# Patient Record
Sex: Female | Born: 1985
Health system: Southern US, Community
[De-identification: ages and names within clinical notes are randomized; demographics above are authoritative.]

## PROBLEM LIST (undated history)

## (undated) DIAGNOSIS — B9689 Other specified bacterial agents as the cause of diseases classified elsewhere: Secondary | ICD-10-CM

## (undated) DIAGNOSIS — R7303 Prediabetes: Secondary | ICD-10-CM

## (undated) DIAGNOSIS — E079 Disorder of thyroid, unspecified: Secondary | ICD-10-CM

## (undated) DIAGNOSIS — N898 Other specified noninflammatory disorders of vagina: Secondary | ICD-10-CM

## (undated) DIAGNOSIS — N63 Unspecified lump in unspecified breast: Secondary | ICD-10-CM

## (undated) DIAGNOSIS — N760 Acute vaginitis: Secondary | ICD-10-CM

## (undated) DIAGNOSIS — E162 Hypoglycemia, unspecified: Secondary | ICD-10-CM

## (undated) HISTORY — PX: ABDOMINAL HYSTERECTOMY: SHX81

## (undated) HISTORY — PX: TONSILLECTOMY: SUR1361

## (undated) HISTORY — DX: Unspecified lump in unspecified breast: N63.0

## (undated) HISTORY — DX: Other specified bacterial agents as the cause of diseases classified elsewhere: B96.89

## (undated) HISTORY — DX: Other specified noninflammatory disorders of vagina: N89.8

## (undated) HISTORY — PX: TUBAL LIGATION: SHX77

## (undated) HISTORY — PX: APPENDECTOMY: SHX54

## (undated) HISTORY — DX: Disorder of thyroid, unspecified: E07.9

## (undated) HISTORY — DX: Acute vaginitis: N76.0

---

## 2009-11-26 ENCOUNTER — Ambulatory Visit (HOSPITAL_COMMUNITY): Admission: RE | Admit: 2009-11-26 | Discharge: 2009-11-26 | Payer: Self-pay | Admitting: Obstetrics

## 2010-01-18 ENCOUNTER — Inpatient Hospital Stay (HOSPITAL_COMMUNITY)
Admission: AD | Admit: 2010-01-18 | Discharge: 2010-01-18 | Payer: Self-pay | Source: Home / Self Care | Attending: Obstetrics | Admitting: Obstetrics

## 2010-02-08 LAB — CBC
HCT: 41.3 % (ref 36.0–46.0)
Hemoglobin: 14.4 g/dL (ref 12.0–15.0)
MCH: 29.2 pg (ref 26.0–34.0)
MCHC: 34.9 g/dL (ref 30.0–36.0)
MCV: 83.8 fL (ref 78.0–100.0)
Platelets: 228 10*3/uL (ref 150–400)
RBC: 4.93 MIL/uL (ref 3.87–5.11)
RDW: 13.1 % (ref 11.5–15.5)
WBC: 15.7 10*3/uL — ABNORMAL HIGH (ref 4.0–10.5)

## 2010-02-08 LAB — SURGICAL PCR SCREEN
MRSA, PCR: NEGATIVE
Staphylococcus aureus: NEGATIVE

## 2010-02-08 LAB — RPR: RPR Ser Ql: NONREACTIVE

## 2010-02-10 ENCOUNTER — Inpatient Hospital Stay (HOSPITAL_COMMUNITY)
Admission: RE | Admit: 2010-02-10 | Discharge: 2010-02-13 | Payer: Self-pay | Source: Home / Self Care | Attending: Obstetrics | Admitting: Obstetrics

## 2010-02-10 ENCOUNTER — Encounter (INDEPENDENT_AMBULATORY_CARE_PROVIDER_SITE_OTHER): Payer: Self-pay | Admitting: Obstetrics

## 2010-02-15 LAB — CBC
HCT: 33.2 % — ABNORMAL LOW (ref 36.0–46.0)
Hemoglobin: 11.4 g/dL — ABNORMAL LOW (ref 12.0–15.0)
MCH: 28.9 pg (ref 26.0–34.0)
MCHC: 34.3 g/dL (ref 30.0–36.0)
MCV: 84.3 fL (ref 78.0–100.0)
Platelets: 162 10*3/uL (ref 150–400)
RBC: 3.94 MIL/uL (ref 3.87–5.11)
RDW: 13.2 % (ref 11.5–15.5)
WBC: 14.3 10*3/uL — ABNORMAL HIGH (ref 4.0–10.5)

## 2010-02-15 LAB — RH IMMUNE GLOB WKUP(>/=20WKS)(NOT WOMEN'S HOSP)
Fetal Screen: NEGATIVE
Unit division: 0

## 2010-02-17 NOTE — Discharge Summary (Addendum)
  NAMEARMIDA, [name]               ACCOUNT NO.:  [id number]  MEDICAL RECORD NO.:  [id number]          PATIENT TYPE:  INP  LOCATION:  9104                          FACILITY:  [facility]  PHYSICIAN:  Roseanna Rainbow, M.D.DATE OF BIRTH:  Oct 30, 1985  DATE OF ADMISSION:  02/10/2010 DATE OF DISCHARGE:  02/13/2010                              DISCHARGE SUMMARY   CHIEF COMPLAINT:  The patient is a 25 year old gravid 2, para 1-0-0-1 with an estimated date of confinement of January 24 with a history of previous cesarean delivery, now for an elective repeat cesarean delivery and bilateral tubal ligation.  HISTORY OF PRESENT ILLNESS:  Please see the above.  PRENATAL LABS:  GBS negative, Rh negative.  She received RhoGAM.  FAMILY HISTORY:  She denies.  SOCIAL HISTORY:  She denies any tobacco, ethanol, or drug use.  ALLERGIES:  No known drug allergies.  MEDICATIONS:  Please see the medication reconciliation form.  REVIEW OF SYSTEMS:  Noncontributory.  PHYSICAL EXAMINATION:  VITAL SIGNS:  Stable, afebrile. GENERAL:  No apparent distress, well developed. HEAD, EYES, EARS, NOSE, AND THROAT:  Normocephalic and atraumatic. NECK:  Supple. LUNGS:  Clear to auscultation bilaterally. ABDOMEN:  Gravid. PELVIC:  Omitted. EXTREMITIES:  Trace lower extremity edema. SKIN:  Without exanthem.  ASSESSMENT:  Intrauterine pregnancy at term, history of previous cesarean delivery, nullipara, desires tubal ligation.  PLAN:  Admission, repeat cesarean delivery, and bilateral tubal ligation.  HOSPITAL COURSE:  The patient was admitted.  She underwent a repeat cesarean delivery and bilateral tubal ligation.  Please see the dictated summary for findings.  On postoperative day #1, hemoglobin was 11.4. The remainder of her hospital course was uneventful.  She was discharged to home on postoperative day #3.  DISCHARGE DIAGNOSIS:  Intrauterine pregnancy at term, history of previous cesarean delivery,  desires sterilization procedure.  PROCEDURES:  Repeat cesarean delivery and bilateral tubal ligation.  CONDITION:  Good.  DIET:  Regular.  ACTIVITY:  Pelvic rest progressive activity.  MEDICATIONS: 1. Percocet 5/325 one-two tablets every 6 hours as needed. 2. Prenatal vitamins.  DISPOSITION:  The patient was to follow up with Dr. Gaynell Face.     Roseanna Rainbow, M.D.     Judee Clara  D:  02/13/2010  T:  02/13/2010  Job:  [id number]  cc:   Kathreen Cosier, M.D. Fax: [phone]  Electronically Signed by Antionette Char M.D. on 02/17/2010 09:12:12 PM

## 2010-02-19 NOTE — Op Note (Signed)
  [name], [name]               ACCOUNT NO.:  [id number]  MEDICAL RECORD NO.:  [id number]          PATIENT TYPE:  INP  LOCATION:  9104                          [facility]ILITY:  [facility]  PHYSICIAN:  Kathreen Cosier, M.D.DATE OF BIRTH:  [birth year]  DATE OF PROCEDURE:  02/10/2010 DATE OF DISCHARGE:                              OPERATIVE REPORT   PREOPERATIVE DIAGNOSES: 1. Previous cesarean section at term. 2. Multiparity. 3. Desires repeat cesarean section and tubal ligation.  POSTOPERATIVE DIAGNOSES: 1. Previous cesarean section at term. 2. Multiparity. 3. Desires repeat cesarean section and tubal ligation.  SURGEON:  Kathreen Cosier, MD  FIRST ASSISTANT:  Charles A. Clearance Coots, MD  ANESTHESIA:  Spinal.  PROCEDURE IN DETAIL:  The patient placed on the operating table in supine position after the spinal administered.  Abdomen prepped and draped.  Bladder emptied with Foley catheter.  Transverse suprapubic incision made through the old scar, carried down to the rectus fascia. Fascia cleaned and incised length of the incision.  Recti muscles was retracted laterally and the peritoneum incised longitudinally.  The uterus was adherent to the anterior abdominal wall and there were multiple large adhesions which were cut with sharp dissection.  Bladder flap was developed and the bladder pushed down off of the lower uterine segment.  Transverse incision made.  Fluid clear.  The patient delivered from the LOA position of a female Apgars are 8 and 9, weighing 6 pounds 4 ounces.  The fluid was clear.  The placenta was posterior, removed manually, and sent to labor and delivery.  Uterine cavity closed in one layer with continuous suture of #1 chromic.  Bladder flap was reattached with 2-0 chromic.  The remaining adhesions between the abdominal wall and the uterus were lysed with cautery.  The right tube grasped in midportion with Babcock clamp.  A 0 plain suture placed in the mesosalpinx  below the portion of tube within the clamp.  On the left side, the tube was removed in its entirety because of adhesions. Hemostasis achieved using 0 plain suture.  Lap and sponge counts correct.  Abdomen closed in layers, fascia continuous suture of 0 Dexon and the skin closed with subcuticular stitch of 4-0 Monocryl.  Blood loss was 600 mL.  The patient tolerated the procedure well, taken to recovery room in good condition.          ______________________________ Kathreen Cosier, M.D.     BAM/MEDQ  D:  02/10/2010  T:  02/10/2010  Job:  [id number]  Electronically Signed by Francoise Ceo M.D. on 02/17/2010 06:31:52 AM

## 2010-04-07 LAB — RH IMMUNE GLOBULIN WORKUP (NOT WOMEN'S HOSP)
ABO/RH(D): A NEG
Antibody Screen: NEGATIVE
Unit division: 0

## 2011-12-05 ENCOUNTER — Encounter (HOSPITAL_COMMUNITY): Payer: Self-pay | Admitting: Emergency Medicine

## 2011-12-05 ENCOUNTER — Emergency Department (HOSPITAL_COMMUNITY)
Admission: EM | Admit: 2011-12-05 | Discharge: 2011-12-05 | Disposition: A | Discharge status: Home / Self Care | Payer: BC Managed Care – PPO | Attending: Emergency Medicine | Admitting: Emergency Medicine

## 2011-12-05 DIAGNOSIS — L509 Urticaria, unspecified: Secondary | ICD-10-CM | POA: Insufficient documentation

## 2011-12-05 DIAGNOSIS — I10 Essential (primary) hypertension: Secondary | ICD-10-CM | POA: Insufficient documentation

## 2011-12-05 DIAGNOSIS — E162 Hypoglycemia, unspecified: Secondary | ICD-10-CM

## 2011-12-05 HISTORY — DX: Hypoglycemia, unspecified: E16.2

## 2011-12-05 LAB — GLUCOSE, CAPILLARY: Glucose-Capillary: 105 mg/dL — ABNORMAL HIGH (ref 70–99)

## 2011-12-05 NOTE — ED Notes (Signed)
Pt states has sudden onset of hot flash and dizziness. Pt checked bp at work and was elevated. Pt then checked sugar and was 52. Pt dranked something and then started feeling sweaty and disoriented

## 2011-12-05 NOTE — ED Notes (Signed)
Pt verbalized understanding of d/c instructions, states that she has f/u appt with specialist, pt ambulated with steady gait, refused wheelchair

## 2011-12-05 NOTE — ED Notes (Signed)
MD at bedside. 

## 2011-12-05 NOTE — ED Provider Notes (Signed)
History   This chart was scribed for Donnetta Hutching, MD by Charolett Bumpers, ER Scribe. The patient was seen in room APA18/APA18. Patient's care was started at 1043.   CSN: [id number]  Arrival date & time 12/05/11  1037   First MD Initiated Contact with Patient 12/05/11 1043      Chief Complaint  Patient presents with  . Hypoglycemia  . Hypertension  . Urticaria    The history is provided by the patient. No language interpreter was used.   [name] is a 26 y.o. female who presents to the Emergency Department complaining of hypoglycemia. She states that she was at work this morning when she had a sudden onset of dizziness and hot-flashes. She states she checked her BP which was 148/69 and her blood sugar was 57. She states she then sat down without relief. She started having tremors, nausea and tinnitus. She states she was given a Nepro drink full of calories at that time. While sitting down, her BP was rechecked and was 170/100 and her skin became flushed and diaphoretic. She states she ate breakfast around 8:15 am and around 5 am this morning. She reports a h/o hypoglycemia but states she hasn't been dx with DM. She states she has had several similar episodes recently and has an appointment with endocrinology on 12/3 due to an abnormality with her insulin levels. She works as a Insurance account manager. BP here in ED is 138/87.   Past Medical History  Diagnosis Date  . Hypoglycemia     Past Surgical History  Procedure Date  . Cesarean section   . Tonsillectomy     History reviewed. No pertinent family history.  History  Substance Use Topics  . Smoking status: Never Smoker   . Smokeless tobacco: Not on file  . Alcohol Use: No    OB History    Grav Para Term Preterm Abortions TAB SAB Ect Mult Living                  Review of Systems A complete 10 system review of systems was obtained and all systems are negative except as noted in the HPI and PMH.   Allergies    Codeine  Home Medications  No current outpatient prescriptions on file.  BP 138/87  Pulse 69  Temp 99.1 F (37.3 C) (Oral)  Resp 18  Ht 5\' 2"  (1.575 m)  Wt 186 lb (84.369 kg)  BMI 34.02 kg/m2  SpO2 100%  LMP 11/20/2011  Physical Exam  Nursing note and vitals reviewed. Constitutional: She is oriented to person, place, and time. She appears well-developed and well-nourished.  HENT:  Head: Normocephalic and atraumatic.  Eyes: Conjunctivae normal and EOM are normal. Pupils are equal, round, and reactive to light.  Neck: Normal range of motion. Neck supple.  Cardiovascular: Normal rate, regular rhythm and normal heart sounds.   No murmur heard. Pulmonary/Chest: Effort normal and breath sounds normal. No respiratory distress. She has no wheezes.  Abdominal: Soft. Bowel sounds are normal.  Musculoskeletal: Normal range of motion.  Neurological: She is alert and oriented to person, place, and time.  Skin: Skin is warm and dry.  Psychiatric: She has a normal mood and affect.    ED Course  Procedures (including critical care time)  DIAGNOSTIC STUDIES: Oxygen Saturation is 100% on room air, normal by my interpretation.    COORDINATION OF CARE:  10:55-Discussed planned course of treatment with the patient including Poct CBC, f/u with endocrinologist and  eating protein rich foods frequently to help with episodes of hypoglycemia, who is agreeable at this time.   Results for orders placed during the hospital encounter of 12/05/11  GLUCOSE, CAPILLARY      Component Value Range   Glucose-Capillary 105 (*) 70 - 99 mg/dL    No results found.   No diagnosis found.    MDM  Patient has had recurrent hypoglycemic episodes.  Encouraged to eat frequent small meals and to increase protein intake. She has an appointment with an endocrinologist December 3 in [place]   I personally performed the services described in this documentation, which was scribed in my presence. The  recorded information has been reviewed and is accurate.       Donnetta Hutching, MD 12/05/11 1249

## 2012-06-26 ENCOUNTER — Other Ambulatory Visit (HOSPITAL_COMMUNITY): Payer: Self-pay | Admitting: Nurse Practitioner

## 2012-06-26 DIAGNOSIS — R1903 Right lower quadrant abdominal swelling, mass and lump: Secondary | ICD-10-CM

## 2012-06-29 ENCOUNTER — Ambulatory Visit (HOSPITAL_COMMUNITY)
Admission: RE | Admit: 2012-06-29 | Discharge: 2012-06-29 | Disposition: A | Discharge status: Home / Self Care | Payer: Managed Care, Other (non HMO) | Source: Ambulatory Visit | Attending: Nurse Practitioner | Admitting: Nurse Practitioner

## 2012-06-29 ENCOUNTER — Other Ambulatory Visit (HOSPITAL_COMMUNITY): Payer: Self-pay | Admitting: Nurse Practitioner

## 2012-06-29 DIAGNOSIS — R1903 Right lower quadrant abdominal swelling, mass and lump: Secondary | ICD-10-CM

## 2012-06-29 DIAGNOSIS — R1900 Intra-abdominal and pelvic swelling, mass and lump, unspecified site: Secondary | ICD-10-CM | POA: Insufficient documentation

## 2012-07-18 ENCOUNTER — Encounter (HOSPITAL_COMMUNITY): Payer: Self-pay | Admitting: Pharmacy Technician

## 2012-07-19 NOTE — Consult Note (Signed)
NAMETYSHAY, ADEE NO.:  [id number]  MEDICAL RECORD NO.:  [id number]  LOCATION:  PERIO                         FACILITY:  [facility]  PHYSICIAN:  Barbaraann Barthel, M.D. DATE OF BIRTH:  Apr 28, 1985  DATE OF CONSULTATION:  07/18/2012 DATE OF DISCHARGE:                                CONSULTATION   NOTE:  This is a 27 year old white female who has been complaining of pain in her lower abdomen along a previous C-section scar.  She has had two previous C-sections and the last one was in 2012.  She has had pain that is characterized by being most intense during her menses and this has been over the last 2 years.  Palpably, I feel a nodule that was confirmed on presents by sonography to be a subcutaneous nodule approximately 1.5-2 cm in diameter.  I suspect this is endometriosis and we will plan to excise this via the outpatient department.  PAST MEDICAL HISTORY:  Fairly unremarkable.  PAST SURGICAL HISTORY:  Past surgeries have include a C-section in 2009 and 2012, and a T and A in childhood, and at her last C-section, she had a tubal ligation.  MEDICATIONS:  See medication list.  ALLERGIES:  The patient has problems with codeine, which causes her to have a rash.  SOCIAL HISTORY:  She is a nonsmoker and nondrinker.  PHYSICAL EXAMINATION:  VITAL SIGNS:  She is 5 feet and 2 inches, weighs 190 pounds, temperature is 97.9, pulse rate 60, respirations 14, blood pressure 100/60. HEENT:  Head is normocephalic.  Eyes; extraocular movements are intact. Pupils were round and reactive to light and accommodation.  There is no conjunctive pallor or scleral injection.  The sclera is a normal tincture.  Nose and oral mucosa are moist. NECK:  She has no cervical adenopathy.  No thyromegaly.  No bruits auscultated. CHEST:  Clear, both anterior and posterior auscultation. HEART:  Regular rhythm.  Breasts and axilla are without masses. ABDOMEN:  Soft.  She has no visceromegaly.   She has pain located on the right side of her C-section scar.  This does not feel like a hernia, feels like a subcutaneous nodule. RECTAL:  Deferred. PELVIC:  Deferred. EXTREMITIES:  Within normal limits.  REVIEW OF SYSTEMS:  NEUROLOGIC:  No history of migraine, seizures or any lateralizing neurological findings.  ENDOCRINE:  No history of diabetes, thyroid disease or adrenal problems.  CARDIOPULMONARY:  Within normal limits.  MUSCULOSKELETAL:  The patient is obese.  GI:  No history of hepatitis, constipation, diarrhea, bright red rectal bleeding, melena. No history of inflammatory bowel disease or irritable bowel syndrome. No unexplained weight loss.  She has never had a colonoscopy.  GU:  No history of frequency, dysuria or kidney stones.  OB/GYN HISTORY:  She is gravida 2, para 0, cesarean 2, abortus 0, female whose last menstrual period was on July 11, 2012 and she had a tubal ligation in 2012.  REVIEW OF HISTORY AND PHYSICAL:  Therefore, Ms. Graul is a 27 year old white female who clinically has endometriosis and there was a nodule noted on sonogram.  We will remove this via the outpatient department. I discussed surgery with her discussing complications, not limited  to, but including bleeding and infection, and the possibility that this could recur with other endometrial implants presenting later.  Informed consent was obtained.     Barbaraann Barthel, M.D.     WB/MEDQ  D:  07/18/2012  T:  07/19/2012  Job:  [id number]  cc:   [facility]

## 2012-07-20 ENCOUNTER — Encounter (HOSPITAL_COMMUNITY)
Admission: RE | Admit: 2012-07-20 | Discharge: 2012-07-20 | Disposition: A | Discharge status: Home / Self Care | Payer: Managed Care, Other (non HMO) | Source: Ambulatory Visit | Attending: General Surgery | Admitting: General Surgery

## 2012-07-20 ENCOUNTER — Encounter (HOSPITAL_COMMUNITY): Payer: Self-pay

## 2012-07-20 LAB — CBC WITH DIFFERENTIAL/PLATELET
Basophils Absolute: 0 10*3/uL (ref 0.0–0.1)
Basophils Relative: 0 % (ref 0–1)
Eosinophils Absolute: 0.1 10*3/uL (ref 0.0–0.7)
Eosinophils Relative: 2 % (ref 0–5)
HCT: 44.5 % (ref 36.0–46.0)
Hemoglobin: 15.5 g/dL — ABNORMAL HIGH (ref 12.0–15.0)
Lymphocytes Relative: 28 % (ref 12–46)
Lymphs Abs: 2.2 10*3/uL (ref 0.7–4.0)
MCH: 29.6 pg (ref 26.0–34.0)
MCHC: 34.8 g/dL (ref 30.0–36.0)
MCV: 85.1 fL (ref 78.0–100.0)
Monocytes Absolute: 0.7 10*3/uL (ref 0.1–1.0)
Monocytes Relative: 8 % (ref 3–12)
Neutro Abs: 5 10*3/uL (ref 1.7–7.7)
Neutrophils Relative %: 62 % (ref 43–77)
Platelets: 245 10*3/uL (ref 150–400)
RBC: 5.23 MIL/uL — ABNORMAL HIGH (ref 3.87–5.11)
RDW: 12.9 % (ref 11.5–15.5)
WBC: 8 10*3/uL (ref 4.0–10.5)

## 2012-07-20 LAB — SURGICAL PCR SCREEN
MRSA, PCR: NEGATIVE
Staphylococcus aureus: NEGATIVE

## 2012-07-20 LAB — BASIC METABOLIC PANEL
BUN: 11 mg/dL (ref 6–23)
CO2: 30 mEq/L (ref 19–32)
Calcium: 9.7 mg/dL (ref 8.4–10.5)
Chloride: 100 mEq/L (ref 96–112)
Creatinine, Ser: 0.94 mg/dL (ref 0.50–1.10)
GFR calc Af Amer: >90 mL/min (ref >90)
GFR calc non Af Amer: 84 mL/min — ABNORMAL LOW (ref >90)
Glucose, Bld: 117 mg/dL — ABNORMAL HIGH (ref 70–99)
Potassium: 4.4 mEq/L (ref 3.5–5.1)
Sodium: 139 mEq/L (ref 135–145)

## 2012-07-20 LAB — HCG, SERUM, QUALITATIVE: Preg, Serum: NEGATIVE

## 2012-07-20 NOTE — Patient Instructions (Signed)
RENALDA LOCKLIN  07/20/2012   Your procedure is scheduled on:  07/23/12  Report to [facility] at 07:00 AM.  Call this number if you have problems the morning of surgery: [phone]   Remember:   Do not eat food or drink liquids after midnight.   Take these medicines the morning of surgery with A SIP OF WATER: None   Do not wear jewelry, make-up or nail polish.  Do not wear lotions, powders, or perfumes.   Do not shave 48 hours prior to surgery. Men may shave face and neck.  Do not bring valuables to the hospital.  [facility] is not responsible for any belongings or valuables.  Contacts, dentures or bridgework may not be worn into surgery.  Leave suitcase in the car. After surgery it may be brought to your room.  For patients admitted to the hospital, checkout time is 11:00 AM the day of discharge.   Patients discharged the day of surgery will not be allowed to drive home.    Special Instructions: Shower using CHG 2 nights before surgery and the night before surgery.  If you shower the day of surgery use CHG.  Use special wash - you have one bottle of CHG for all showers.  You should use approximately 1/3 of the bottle for each shower.   Please read over the following fact sheets that you were given: Pain Booklet, MRSA Information, Surgical Site Infection Prevention, Anesthesia Post-op Instructions and Care and Recovery After Surgery    PATIENT INSTRUCTIONS POST-ANESTHESIA  IMMEDIATELY FOLLOWING SURGERY:  Do not drive or operate machinery for the first twenty four hours after surgery.  Do not make any important decisions for twenty four hours after surgery or while taking narcotic pain medications or sedatives.  If you develop intractable nausea and vomiting or a severe headache please notify your doctor immediately.  FOLLOW-UP:  Please make an appointment with your surgeon as instructed. You do not need to follow up with anesthesia unless specifically instructed to do so.  WOUND CARE  INSTRUCTIONS (if applicable):  Keep a dry clean dressing on the anesthesia/puncture wound site if there is drainage.  Once the wound has quit draining you may leave it open to air.  Generally you should leave the bandage intact for twenty four hours unless there is drainage.  If the epidural site drains for more than 36-48 hours please call the anesthesia department.  QUESTIONS?:  Please feel free to call your physician or the hospital operator if you have any questions, and they will be happy to assist you.

## 2012-07-23 ENCOUNTER — Encounter (HOSPITAL_COMMUNITY)
Admission: RE | Disposition: A | Discharge status: Home / Self Care | Payer: Self-pay | Source: Ambulatory Visit | Attending: General Surgery

## 2012-07-23 ENCOUNTER — Ambulatory Visit (HOSPITAL_COMMUNITY)
Admission: RE | Admit: 2012-07-23 | Discharge: 2012-07-23 | Disposition: A | Discharge status: Home / Self Care | Payer: Managed Care, Other (non HMO) | Source: Ambulatory Visit | Attending: General Surgery | Admitting: General Surgery

## 2012-07-23 ENCOUNTER — Encounter (HOSPITAL_COMMUNITY): Payer: Self-pay | Admitting: *Deleted

## 2012-07-23 ENCOUNTER — Ambulatory Visit (HOSPITAL_COMMUNITY): Payer: Managed Care, Other (non HMO) | Admitting: Anesthesiology

## 2012-07-23 ENCOUNTER — Encounter (HOSPITAL_COMMUNITY): Payer: Self-pay | Admitting: Anesthesiology

## 2012-07-23 DIAGNOSIS — E669 Obesity, unspecified: Secondary | ICD-10-CM | POA: Insufficient documentation

## 2012-07-23 DIAGNOSIS — Z9851 Tubal ligation status: Secondary | ICD-10-CM | POA: Insufficient documentation

## 2012-07-23 DIAGNOSIS — R229 Localized swelling, mass and lump, unspecified: Secondary | ICD-10-CM | POA: Insufficient documentation

## 2012-07-23 DIAGNOSIS — N808 Other endometriosis: Secondary | ICD-10-CM | POA: Insufficient documentation

## 2012-07-23 HISTORY — PX: MASS EXCISION: SHX2000

## 2012-07-23 LAB — GLUCOSE, CAPILLARY: Glucose-Capillary: 98 mg/dL (ref 70–99)

## 2012-07-23 SURGERY — EXCISION MASS
Anesthesia: General | Site: Abdomen | Wound class: Clean

## 2012-07-23 MED ORDER — FENTANYL CITRATE 0.05 MG/ML IJ SOLN
INTRAMUSCULAR | Status: DC | PRN
  Administered 2012-07-23 (×2): 50 ug via INTRAVENOUS
  Administered 2012-07-23: 100 ug via INTRAVENOUS

## 2012-07-23 MED ORDER — LIDOCAINE HCL (CARDIAC) 20 MG/ML IV SOLN
INTRAVENOUS | Status: DC | PRN
  Administered 2012-07-23: 50 mg via INTRAVENOUS

## 2012-07-23 MED ORDER — LIDOCAINE HCL (PF) 1 % IJ SOLN
INTRAMUSCULAR | Status: AC
  Filled 2012-07-23: qty 5

## 2012-07-23 MED ORDER — SUCCINYLCHOLINE CHLORIDE 20 MG/ML IJ SOLN
INTRAMUSCULAR | Status: AC
  Filled 2012-07-23: qty 1

## 2012-07-23 MED ORDER — NEOSTIGMINE METHYLSULFATE 1 MG/ML IJ SOLN
INTRAMUSCULAR | Status: DC | PRN
  Administered 2012-07-23: 4 mg via INTRAVENOUS

## 2012-07-23 MED ORDER — MIDAZOLAM HCL 2 MG/2ML IJ SOLN
1.0000 mg | INTRAMUSCULAR | Status: DC | PRN
  Administered 2012-07-23 (×2): 2 mg via INTRAVENOUS

## 2012-07-23 MED ORDER — ROCURONIUM BROMIDE 50 MG/5ML IV SOLN
INTRAVENOUS | Status: AC
  Filled 2012-07-23: qty 1

## 2012-07-23 MED ORDER — PROPOFOL 10 MG/ML IV EMUL
INTRAVENOUS | Status: AC
  Filled 2012-07-23: qty 20

## 2012-07-23 MED ORDER — BACITRACIN-NEOMYCIN-POLYMYXIN 400-5-5000 EX OINT
TOPICAL_OINTMENT | CUTANEOUS | Status: DC | PRN
  Administered 2012-07-23: 1 via TOPICAL

## 2012-07-23 MED ORDER — ONDANSETRON HCL 4 MG/2ML IJ SOLN: 4.0000 mg | Freq: Once | INTRAMUSCULAR | Status: DC | PRN

## 2012-07-23 MED ORDER — GLYCOPYRROLATE 0.2 MG/ML IJ SOLN
INTRAMUSCULAR | Status: AC
  Filled 2012-07-23: qty 2

## 2012-07-23 MED ORDER — NEOSTIGMINE METHYLSULFATE 1 MG/ML IJ SOLN
INTRAMUSCULAR | Status: AC
  Filled 2012-07-23: qty 1

## 2012-07-23 MED ORDER — MIDAZOLAM HCL 2 MG/2ML IJ SOLN
INTRAMUSCULAR | Status: AC
  Filled 2012-07-23: qty 2

## 2012-07-23 MED ORDER — BUPIVACAINE HCL (PF) 0.5 % IJ SOLN
INTRAMUSCULAR | Status: AC
  Filled 2012-07-23: qty 30

## 2012-07-23 MED ORDER — BUPIVACAINE HCL 0.5 % IJ SOLN
INTRAMUSCULAR | Status: DC | PRN
  Administered 2012-07-23: 5 mL

## 2012-07-23 MED ORDER — ARTIFICIAL TEARS OP OINT
TOPICAL_OINTMENT | OPHTHALMIC | Status: AC
  Filled 2012-07-23: qty 3.5

## 2012-07-23 MED ORDER — BACITRACIN-NEOMYCIN-POLYMYXIN 400-5-5000 EX OINT
TOPICAL_OINTMENT | CUTANEOUS | Status: AC
  Filled 2012-07-23: qty 1

## 2012-07-23 MED ORDER — LACTATED RINGERS IV SOLN
INTRAVENOUS | Status: DC | PRN
  Administered 2012-07-23 (×2): via INTRAVENOUS

## 2012-07-23 MED ORDER — ONDANSETRON HCL 4 MG/2ML IJ SOLN
4.0000 mg | Freq: Once | INTRAMUSCULAR | Status: AC
  Administered 2012-07-23: 4 mg via INTRAVENOUS

## 2012-07-23 MED ORDER — FENTANYL CITRATE 0.05 MG/ML IJ SOLN
INTRAMUSCULAR | Status: AC
  Filled 2012-07-23: qty 5

## 2012-07-23 MED ORDER — ROCURONIUM BROMIDE 100 MG/10ML IV SOLN
INTRAVENOUS | Status: DC | PRN
  Administered 2012-07-23: 30 mg via INTRAVENOUS

## 2012-07-23 MED ORDER — FENTANYL CITRATE 0.05 MG/ML IJ SOLN: 25.0000 ug | INTRAMUSCULAR | Status: DC | PRN

## 2012-07-23 MED ORDER — CEFAZOLIN SODIUM-DEXTROSE 2-3 GM-% IV SOLR
INTRAVENOUS | Status: AC
  Filled 2012-07-23: qty 50

## 2012-07-23 MED ORDER — GLYCOPYRROLATE 0.2 MG/ML IJ SOLN
INTRAMUSCULAR | Status: AC
  Filled 2012-07-23: qty 3

## 2012-07-23 MED ORDER — LACTATED RINGERS IV SOLN
INTRAVENOUS | Status: DC
  Administered 2012-07-23: 08:00:00 via INTRAVENOUS

## 2012-07-23 MED ORDER — PROPOFOL 10 MG/ML IV BOLUS
INTRAVENOUS | Status: DC | PRN
  Administered 2012-07-23: 150 mg via INTRAVENOUS

## 2012-07-23 MED ORDER — LIDOCAINE HCL (PF) 0.5 % IJ SOLN
INTRAMUSCULAR | Status: AC
  Filled 2012-07-23: qty 50

## 2012-07-23 MED ORDER — CEFAZOLIN SODIUM-DEXTROSE 2-3 GM-% IV SOLR
2.0000 g | Freq: Once | INTRAVENOUS | Status: AC
  Administered 2012-07-23: 2 g via INTRAVENOUS

## 2012-07-23 MED ORDER — GLYCOPYRROLATE 0.2 MG/ML IJ SOLN
INTRAMUSCULAR | Status: DC | PRN
  Administered 2012-07-23: 0.6 mg via INTRAVENOUS

## 2012-07-23 MED ORDER — 0.9 % SODIUM CHLORIDE (POUR BTL) OPTIME
TOPICAL | Status: DC | PRN
  Administered 2012-07-23: 1000 mL

## 2012-07-23 MED ORDER — ONDANSETRON HCL 4 MG/2ML IJ SOLN
INTRAMUSCULAR | Status: AC
  Filled 2012-07-23: qty 2

## 2012-07-23 SURGICAL SUPPLY — 33 items
BAG HAMPER (MISCELLANEOUS) ×2 IMPLANT
CLOTH BEACON ORANGE TIMEOUT ST (SAFETY) ×2 IMPLANT
COVER LIGHT HANDLE STERIS (MISCELLANEOUS) ×4 IMPLANT
DECANTER SPIKE VIAL GLASS SM (MISCELLANEOUS) ×1 IMPLANT
ELECT REM PT RETURN 9FT ADLT (ELECTROSURGICAL) ×2
ELECTRODE REM PT RTRN 9FT ADLT (ELECTROSURGICAL) ×1 IMPLANT
FORMALIN 10 PREFIL 120ML (MISCELLANEOUS) ×2 IMPLANT
GLOVE BIOGEL PI IND STRL 7.0 (GLOVE) IMPLANT
GLOVE BIOGEL PI INDICATOR 7.0 (GLOVE) ×2
GLOVE ECLIPSE 6.5 STRL STRAW (GLOVE) ×1 IMPLANT
GLOVE SKINSENSE NS SZ7.0 (GLOVE) ×2
GLOVE SKINSENSE STRL SZ7.0 (GLOVE) ×1 IMPLANT
GOWN STRL REIN XL XLG (GOWN DISPOSABLE) ×6 IMPLANT
KIT ROOM TURNOVER APOR (KITS) ×2 IMPLANT
MANIFOLD NEPTUNE II (INSTRUMENTS) ×2 IMPLANT
NS IRRIG 1000ML POUR BTL (IV SOLUTION) ×2 IMPLANT
PACK MINOR (CUSTOM PROCEDURE TRAY) ×1 IMPLANT
PAD ARMBOARD 7.5X6 YLW CONV (MISCELLANEOUS) ×2 IMPLANT
SET BASIN LINEN APH (SET/KITS/TRAYS/PACK) ×2 IMPLANT
SPONGE GAUZE 4X4 12PLY (GAUZE/BANDAGES/DRESSINGS) ×2 IMPLANT
SPONGE INTESTINAL PEANUT (DISPOSABLE) ×1 IMPLANT
SPONGE LAP 18X18 X RAY DECT (DISPOSABLE) ×1 IMPLANT
STRIP CLOSURE SKIN 1/2X4 (GAUZE/BANDAGES/DRESSINGS) ×1 IMPLANT
SUT VIC AB 3-0 SH 27 (SUTURE) ×2
SUT VIC AB 3-0 SH 27X BRD (SUTURE) IMPLANT
SUT VIC AB 4-0 PS2 27 (SUTURE) ×1 IMPLANT
SUT VICRYL AB 3 0 TIES (SUTURE) ×1 IMPLANT
SYR BULB IRRIGATION 50ML (SYRINGE) ×1 IMPLANT
SYR CONTROL 10ML LL (SYRINGE) ×1 IMPLANT
TAPE CLOTH SURG 4X10 WHT LF (GAUZE/BANDAGES/DRESSINGS) ×1 IMPLANT
TAPE PAPER 2X10 WHT MICROPORE (GAUZE/BANDAGES/DRESSINGS) ×1 IMPLANT
TOWEL OR 17X26 4PK STRL BLUE (TOWEL DISPOSABLE) ×2 IMPLANT
WATER STERILE IRR 1000ML POUR (IV SOLUTION) ×4 IMPLANT

## 2012-07-23 NOTE — Transfer of Care (Signed)
Immediate Anesthesia Transfer of Care Note  Patient: [name]  Procedure(s) Performed: Procedure(s): EXCISION OF RIGHT LOWER ABDOMINAL MASS, ENDOMETRIAL IMPLANT (N/A)  Patient Location: PACU  Anesthesia Type:General  Level of Consciousness: awake, alert , oriented and patient cooperative  Airway & Oxygen Therapy: Patient Spontanous Breathing and Patient connected to face mask oxygen  Post-op Assessment: Report given to PACU RN and Post -op Vital signs reviewed and stable  Post vital signs: Reviewed and stable  Complications: No apparent anesthesia complications

## 2012-07-23 NOTE — Preoperative (Signed)
Beta Blockers   Reason not to administer Beta Blockers:Not Applicable 

## 2012-07-23 NOTE — Anesthesia Postprocedure Evaluation (Signed)
  Anesthesia Post-op Note  Patient: [name]  Procedure(s) Performed: Procedure(s): EXCISION OF RIGHT LOWER ABDOMINAL MASS, ENDOMETRIAL IMPLANT (N/A)  Patient Location: PACU  Anesthesia Type:General  Level of Consciousness: awake, alert , oriented and patient cooperative  Airway and Oxygen Therapy: Patient Spontanous Breathing and Patient connected to face mask oxygen  Post-op Pain: mild  Post-op Assessment: Post-op Vital signs reviewed, Patient's Cardiovascular Status Stable, Respiratory Function Stable, Patent Airway, No signs of Nausea or vomiting and Pain level controlled  Post-op Vital Signs: Reviewed and stable  Complications: No apparent anesthesia complications

## 2012-07-23 NOTE — Brief Op Note (Signed)
07/23/2012  10:04 AM  PATIENT:  [name]  27 y.o. female  PRE-OPERATIVE DIAGNOSIS:  endometrial implant, abdominal mass right lower abdomen  POST-OPERATIVE DIAGNOSIS:  endometrial implant, abdominal mass right lower abdomen  PROCEDURE:  Procedure(s): EXCISION OF RIGHT LOWER ABDOMINAL MASS, ENDOMETRIAL IMPLANT (N/A)  SURGEON:  Surgeon(s) and Role:    * Marlane Hatcher, MD - Primary  PHYSICIAN ASSISTANT:   ASSISTANTS: none   ANESTHESIA:   general  EBL:  Total I/O In: 1000 [I.V.:1000] Out: 5 [Blood:5]  BLOOD ADMINISTERED:none  DRAINS: none   LOCAL MEDICATIONS USED:  MARCAINE 0.5%   ~ 5 cc .  SPECIMEN:  Source of Specimen:  abdominal wall.  DISPOSITION OF SPECIMEN:  PATHOLOGY  COUNTS:  YES  TOURNIQUET:  * No tourniquets in log *  DICTATION: .Other Dictation: Dictation Number dict. # [id number]  PLAN OF CARE: Discharge to home after PACU  PATIENT DISPOSITION:  PACU - hemodynamically stable.   Delay start of Pharmacological VTE agent (>24hrs) due to surgical blood loss or risk of bleeding: not applicable

## 2012-07-23 NOTE — Progress Notes (Signed)
Mother came in to see patient,  Stated pt had trouble after her tonsillectomy surgery as a child, Dr Jayme Cloud in to discuss with mother and pt.

## 2012-07-23 NOTE — Anesthesia Procedure Notes (Signed)
Procedure Name: Intubation Date/Time: 07/23/2012 9:05 AM Performed by: Carolyne Littles, AMY L Pre-anesthesia Checklist: Patient identified, Patient being monitored, Timeout performed, Emergency Drugs available and Suction available Patient Re-evaluated:Patient Re-evaluated prior to inductionOxygen Delivery Method: Circle System Utilized Preoxygenation: Pre-oxygenation with 100% oxygen Intubation Type: IV induction Ventilation: Mask ventilation without difficulty Laryngoscope Size: 3 and Miller Grade View: Grade I Tube type: Oral Tube size: 7.0 mm Number of attempts: 1 Airway Equipment and Method: stylet Placement Confirmation: ETT inserted through vocal cords under direct vision,  positive ETCO2 and breath sounds checked- equal and bilateral Secured at: 21 cm Tube secured with: Tape Dental Injury: Teeth and Oropharynx as per pre-operative assessment

## 2012-07-23 NOTE — Anesthesia Preprocedure Evaluation (Signed)
Anesthesia Evaluation  Patient identified by MRN, date of birth, ID band Patient awake    Reviewed: Allergy & Precautions, H&P , NPO status , Patient's Chart, lab work & pertinent test results  Airway Mallampati: I TM Distance: >3 FB     Dental  (+) Teeth Intact   Pulmonary neg pulmonary ROS,  breath sounds clear to auscultation        Cardiovascular negative cardio ROS  Rhythm:Regular Rate:Normal     Neuro/Psych    GI/Hepatic negative GI ROS,   Endo/Other  Hx hypoglycemia   Renal/GU      Musculoskeletal   Abdominal   Peds  Hematology   Anesthesia Other Findings   Reproductive/Obstetrics                           Anesthesia Physical Anesthesia Plan  ASA: I  Anesthesia Plan: General   Post-op Pain Management:    Induction: Intravenous  Airway Management Planned: Oral ETT  Additional Equipment:   Intra-op Plan:   Post-operative Plan: Extubation in OR  Informed Consent: I have reviewed the patients History and Physical, chart, labs and discussed the procedure including the risks, benefits and alternatives for the proposed anesthesia with the patient or authorized representative who has indicated his/her understanding and acceptance.     Plan Discussed with:   Anesthesia Plan Comments:         Anesthesia Quick Evaluation  

## 2012-07-23 NOTE — Op Note (Signed)
NAMEMARJAN, [name] NO.:  [id number]  MEDICAL RECORD NO.:  [id number]  LOCATION:  APPO                          FACILITY:  [facility]  PHYSICIAN:  Barbaraann Barthel, M.D. DATE OF BIRTH:  06/16/1985  DATE OF PROCEDURE:  07/23/2012 DATE OF DISCHARGE:  07/23/2012                              OPERATIVE REPORT   PREOPERATIVE DIAGNOSIS:  Endometrial implant.  NOTE:  This is a 27 year old white female who had 2 C-sections, the last one being in 2012, and noted postpartum, shortly thereafter, recurrent pains got worse in her right side just above her C-section scar and these pains increased in intensity and presented with increasing intensity during her menstrual cycle.  A sonogram showed a 1.3-cm nodule in the area where that was pinpointed by her.  The presumptive diagnosis of an endometrial implant was made and we planned to excise this via the outpatient department.  We discussed complications, not limited to, but including bleeding, infection, and the possibility of further surgery might be required.  Informed consent was obtained.  GROSS OPERATIVE FINDINGS:  The patient had a little nodular finding just superior and to the right of the C-section scar.  This was removed with some subcutaneous fat and sent as a specimen.  There were no other abnormalities found.  TECHNIQUE:  The patient was placed in a supine position and after the adequate administration of general anesthesia via endotracheal intubation or LMA, the patient's abdomen was prepped with Betadine solution and draped in usual manner.  The area of interest was previously marked and we did palpate an area of induration in this area. We removed this with other surrounding subcutaneous fat.  We opened along the trajectory of the C-section Pfannenstiel incision.  After we removed this, we then irrigated and controlled the bleeding with a cautery device.  I closed the subcutaneous space with 3-0 Polysorb  and the wound was closed subcuticularly with 4-0 Polysorb suture.  Steri- Strips, Neosporin dressing, and a 4 x 4 were applied.  Prior to closure, all sponge, needle, and instrument counts were found to be correct. Estimated blood loss was minimal, less than 20 mL.  The patient was replaced with crystalloid.  There were no complications.  WOUND CLASSIFICATION:  Clean.  SPECIMEN:  Subcutaneous nodule of the abdominal wall.     Barbaraann Barthel, M.D.     WB/MEDQ  D:  07/23/2012  T:  07/23/2012  Job:  [id number]  cc:   [facility]

## 2012-07-23 NOTE — Progress Notes (Signed)
27 year old W. Female with painful nodule at C-section scar consistent clinically and sonographically with endometrial implant.  We will excise this today as an outpatient.  Procedure and risks explained and informed consent obtained and surgical site marked and labs reviewed.  No clinical change in H&P, dict.# [id number].  Filed Vitals:   07/23/12 0735  BP: 108/65  Pulse: 76  Temp: 98.1 F (36.7 C)  Resp: 18

## 2012-07-24 MED ORDER — PROPOFOL 10 MG/ML IV EMUL
INTRAVENOUS | Status: AC
  Filled 2012-07-24: qty 20

## 2012-07-25 ENCOUNTER — Encounter (HOSPITAL_COMMUNITY): Payer: Self-pay | Admitting: General Surgery

## 2012-08-27 ENCOUNTER — Encounter: Payer: Managed Care, Other (non HMO) | Admitting: Obstetrics and Gynecology

## 2012-11-02 ENCOUNTER — Encounter: Payer: Self-pay | Admitting: *Deleted

## 2012-11-05 ENCOUNTER — Encounter (INDEPENDENT_AMBULATORY_CARE_PROVIDER_SITE_OTHER): Payer: Self-pay

## 2012-11-05 ENCOUNTER — Encounter: Payer: Self-pay | Admitting: Obstetrics and Gynecology

## 2012-11-05 ENCOUNTER — Ambulatory Visit (INDEPENDENT_AMBULATORY_CARE_PROVIDER_SITE_OTHER): Payer: BC Managed Care – PPO | Admitting: Obstetrics and Gynecology

## 2012-11-05 VITALS — BP 134/84 | Ht 62.0 in | Wt 198.0 lb

## 2012-11-05 DIAGNOSIS — Z3202 Encounter for pregnancy test, result negative: Secondary | ICD-10-CM

## 2012-11-05 DIAGNOSIS — N806 Endometriosis in cutaneous scar: Secondary | ICD-10-CM | POA: Insufficient documentation

## 2012-11-05 DIAGNOSIS — N946 Dysmenorrhea, unspecified: Secondary | ICD-10-CM | POA: Insufficient documentation

## 2012-11-05 LAB — POCT URINE PREGNANCY: Preg Test, Ur: NEGATIVE

## 2012-11-05 MED ORDER — LEVONORGEST-ETH ESTRAD 91-DAY 0.15-0.03 &0.01 MG PO TABS: 1.0000 | ORAL_TABLET | Freq: Every day | ORAL | Status: DC

## 2012-11-05 NOTE — Progress Notes (Signed)
Patient ID: Damika Harmon, female   DOB: 1986-01-20, 27 y.o.   MRN: [id number] CC:  followup endometriosis implant surgery by Dr Malvin Johns, ?Recurrence.?second site.    [facility] Visit  Patient name: Allona Gondek MRN [id number]  Date of birth: 1985/11/29  CC & HPI:  Jaxie Racanelli is a 27 y.o. female presenting today for recurrent   Sx of cyclic rlq pain just above prior incision where endometriosis removed.Pt has spoken to Dr Malvin Johns and he thinks it was scar tissue.  ROS:  Menses heavy , with lots of cramping and clots  Pertinent History Reviewed:  Medical & Surgical Hx:  Reviewed: Significant for endometriosis implants in surgical c/s scar Medications: Reviewed & Updated - see associated section Social History: Reviewed -  reports that she has never smoked. She has never used smokeless tobacco.  Objective Findings:  Vitals: BP 134/84  Ht 5\' 2"  (1.575 m)  Wt 198 lb (89.812 kg)  BMI 36.21 kg/m2  LMP 09/05/2012  Physical Examination: General appearance - alert, well appearing, and in no distress, oriented to person, place, and time and playful, active Chest - clear to auscultation, no wheezes, rales or rhonchi, symmetric air entry Abdomen - soft, nontender, nondistended, no masses or organomegaly scars from previous incisions pfannensteil, also llq scar overlying c/s scar. Pt can palpate a small area of point tenderness in subq tissues where prior excision of endometriosis done, no distinct mass felt, but tender. Pelvic - normal external genitalia, vulva, vagina, cervix, uterus and adnexa, no palpable masses Extremities - peripheral pulses normal, no pedal edema, no clubbing or cyanosis   Assessment & Plan:   Possible recurrent endometriosis implants in old c-section scar Dysmenorrhea  Plan:  Advise pt to wait 6 months before considering repeat excision, to allow for maximal healing 2. Suppress any possible endometriosis with continuous OCP use. 3. Consider  laparoscopy to see if any endometriosis noted in pelvis 4.  If pt requires repeat surgery, would likely do a wide excision of the scar to insure that we have the involved area, including removal of the old fascial line of the prior c'section. Would likely need to be bilateral to achieve symmetric scar. Return in December or January Advise weightloss to ease surgery and allow revision of scar.

## 2012-11-12 ENCOUNTER — Telehealth: Payer: Self-pay | Admitting: Obstetrics and Gynecology

## 2012-11-12 NOTE — Telephone Encounter (Signed)
Pt states insurance will not cover current OCP (Camrese). Can you e-scribe another OCP?

## 2012-11-13 NOTE — Telephone Encounter (Signed)
After over 15 minutes trying to solve this by myself in EPIC, and 10 minutes in phone call with pharmacy, the insurer has rejected every pill alternative we could identify Will ask pt to call insurance plan and clarify coverage.

## 2012-11-23 NOTE — Telephone Encounter (Signed)
Routed to Crystal 

## 2013-02-06 ENCOUNTER — Ambulatory Visit (INDEPENDENT_AMBULATORY_CARE_PROVIDER_SITE_OTHER): Payer: BC Managed Care – PPO | Admitting: Obstetrics and Gynecology

## 2013-02-06 ENCOUNTER — Encounter: Payer: Self-pay | Admitting: Obstetrics and Gynecology

## 2013-02-06 ENCOUNTER — Encounter (HOSPITAL_COMMUNITY): Payer: Self-pay | Admitting: Pharmacy Technician

## 2013-02-06 VITALS — BP 116/70 | Ht 62.0 in | Wt 195.0 lb

## 2013-02-06 DIAGNOSIS — N946 Dysmenorrhea, unspecified: Secondary | ICD-10-CM

## 2013-02-06 DIAGNOSIS — N806 Endometriosis in cutaneous scar: Secondary | ICD-10-CM

## 2013-02-06 NOTE — Progress Notes (Signed)
See note by Jenne Campus ,Scribe

## 2013-02-06 NOTE — Progress Notes (Signed)
This chart was scribed for [name], MD by Jenne Campus, Medical Scribe. This patient's care was started at 9:25 AM.  CC: followup endometriosis implant surgery by Dr Romona Curls, ?Recurrence.?second site.   [facility] Visit  Patient name: [name] MRN [id number]  Date of birth: Feb 13, 1985  CC & HPI:  [name] is a 28 y.o. female presenting today for a follow up of recurrent RLQ pain that she attributes to scar tissue for a prior endometriosis removal. Pt states that she had one mass removed with an endometrial implant performed by Dr. Romona Curls and during her f/u visit in this visit in October 2014, another mass was detected. She reports that since the endometriosis surgery she has been experiencing dull abdominal pain that increases in intensity during her menses. She reports that at baseline her menses have been "awful" in flow and pain after her 2 c-sections.  She states that during her last visit she was given a prescription for [facility] during her last visit but reports that it was too expensive. She reports normal PAP smears.   ROS:  Menses heavy , with lots of cramping and clots  Negative for dyspareunia   Pertinent History Reviewed:  Medical & Surgical Hx:  Reviewed: Significant for BTL, endometriosis implants in surgical c/s scar Medications: Reviewed & Updated - see associated section Social History: Reviewed -  reports that she has never smoked. She has never used smokeless tobacco.  Objective Findings:  Vitals: BP 116/70   Ht 5\' 2"  (1.575 m)   Wt 195 lb (88.451 kg)   BMI 35.66 kg/m2   LMP 01/08/2013  Physical Examination: General appearance - alert, well appearing, and in no distress and oriented to person, place, and time Mental status - alert, oriented to person, place, and time, normal mood, behavior, speech, dress, motor activity, and thought processes Abdomen - soft, , nondistended, no masses or organomegaly Well healed cesarean scar with smaller RLQ  scar from endometrial implant excision just above right edge of c/s scar. Tender to palpation in rlq, likely a combination  Of fibrosis and residual endometriosis   Assessment & Plan:  Dx: dysmenorrhea and endometriosis with scar implant  P: Discussed supracervical hysterectomy with bilateral salpingectomy leaving the tubes with the pt and pt agreed to plan. Advised pt that surgery could reduce surgical options for breast CA reconstruction later on in life and pt verbalized her understanding. Also encouraged pt to lose 10 to 15 lbs prior to the surgery to increase success of surgery and pt agreed. Reports she wants to do the surgery as soon as possible.  I personally performed the services described in this documentation, which was scribed in my presence. The recorded information has been reviewed and is accurate.

## 2013-02-12 ENCOUNTER — Other Ambulatory Visit: Payer: Self-pay | Admitting: Obstetrics and Gynecology

## 2013-02-14 ENCOUNTER — Ambulatory Visit (INDEPENDENT_AMBULATORY_CARE_PROVIDER_SITE_OTHER): Payer: BC Managed Care – PPO | Admitting: Obstetrics and Gynecology

## 2013-02-14 ENCOUNTER — Encounter: Payer: Self-pay | Admitting: Obstetrics and Gynecology

## 2013-02-14 VITALS — BP 130/82 | Ht 62.0 in | Wt 193.2 lb

## 2013-02-14 DIAGNOSIS — Z01818 Encounter for other preprocedural examination: Secondary | ICD-10-CM

## 2013-02-14 DIAGNOSIS — Z3202 Encounter for pregnancy test, result negative: Secondary | ICD-10-CM

## 2013-02-14 DIAGNOSIS — N946 Dysmenorrhea, unspecified: Secondary | ICD-10-CM

## 2013-02-14 DIAGNOSIS — N806 Endometriosis in cutaneous scar: Secondary | ICD-10-CM

## 2013-02-14 LAB — POCT URINE PREGNANCY: PREG TEST UR: NEGATIVE

## 2013-02-14 NOTE — Progress Notes (Signed)
This chart was transcribed for Dr. Mallory Shirk by [name], ED scribe.    Preoperative History and Physical  [name] is a 28 y.o. G2P2 here for pre op visit. She has history of endometriosis and dysmenorrhea.    Proposed surgery: hysterectomy,supracervical, bilateral salpingectomy and excision of old scar endometriosis planned for 02/19/13   Past Medical History  Diagnosis Date  . Hypoglycemia    Past Surgical History  Procedure Laterality Date  . Cesarean section  2009, 2012    x2  . Tonsillectomy    . Tubal ligation    . Mass excision N/A 07/23/2012    Procedure: EXCISION OF RIGHT LOWER ABDOMINAL MASS, ENDOMETRIAL IMPLANT;  Surgeon: Scherry Ran, MD;  Location: [facility];  Service: General;  Laterality: N/A;   OB History   Grav Para Term Preterm Abortions TAB SAB Ect Mult Living   2 2             Patient denies any cervical dysplasia or STIs.  (Not in a hospital admission)  Allergies  Allergen Reactions  . Codeine Hives and Itching   Social History:   reports that she has never smoked. She has never used smokeless tobacco. She reports that she does not drink alcohol or use illicit drugs. Family History  Problem Relation Age of Onset  . Hypertension Father   . Diabetes Other   . Heart disease Other   . Colon cancer Other   . Heart disease Other     Review of Systems: Noncontributory  PHYSICAL EXAM: Blood pressure 130/82, height 5\' 2"  (1.575 m), weight 193 lb 3.2 oz (87.635 kg), last menstrual period 01/08/2013. General appearance - alert, well appearing, and in no distress Chest - clear to auscultation, no wheezes, rales or rhonchi, symmetric air entry Heart - normal rate and regular rhythm Abdomen - soft, nontender, nondistended, no masses or organomegaly Pelvic - examination not indicated Extremities - peripheral pulses normal, no pedal edema, no clubbing or cyanosis  Labs: Results for orders placed in visit on 02/14/13 (from the past 336  hour(s))  POCT URINE PREGNANCY   Collection Time    02/14/13  4:25 PM      Result Value Range   Preg Test, Ur Negative      Imaging Studies: No results found.  Assessment: Patient Active Problem List   Diagnosis Date Noted  . Endometriosis in scar 11/05/2012  . Dysmenorrhea 11/05/2012    Plan: Patient will undergo surgical management with hysterectomy,supracervical, bilateral salpingectomy and excision of old scar endometriosis planned for 02/19/13     The risks of surgery were discussed in detail with the patient including but not limited to: bleeding which may require transfusion or reoperation; infection which may require antibiotics; injury to surrounding organs which may involve bowel, bladder, ureters ; need for additional procedures including laparoscopy or laparotomy; thromboembolic phenomenon, surgical site problems and other postoperative/anesthesia complications. Likelihood of success in alleviating the patient's condition was discussed. Routine postoperative instructions will be reviewed with the patient and her family in detail after surgery.  The patient concurred with the proposed plan, giving informed written consent for the surgery.  Patient has been NPO since last night she will remain NPO for procedure.  Anesthesia and OR aware.  Preoperative prophylactic antibiotics and SCDs ordered on call to the OR.    [name] MD 02/14/2013 4:49 PM

## 2013-02-15 ENCOUNTER — Encounter (HOSPITAL_COMMUNITY): Payer: Self-pay

## 2013-02-15 ENCOUNTER — Encounter (HOSPITAL_COMMUNITY)
Admission: RE | Admit: 2013-02-15 | Discharge: 2013-02-15 | Disposition: A | Discharge status: Home / Self Care | Payer: BC Managed Care – PPO | Source: Ambulatory Visit | Attending: Obstetrics and Gynecology | Admitting: Obstetrics and Gynecology

## 2013-02-15 DIAGNOSIS — Z01812 Encounter for preprocedural laboratory examination: Secondary | ICD-10-CM | POA: Insufficient documentation

## 2013-02-15 DIAGNOSIS — Z01818 Encounter for other preprocedural examination: Secondary | ICD-10-CM | POA: Insufficient documentation

## 2013-02-15 LAB — GC/CHLAMYDIA PROBE AMP
CT PROBE, AMP APTIMA: NEGATIVE
GC PROBE AMP APTIMA: NEGATIVE

## 2013-02-15 LAB — CBC
HCT: 43.6 % (ref 36.0–46.0)
HEMOGLOBIN: 15 g/dL (ref 12.0–15.0)
MCH: 29 pg (ref 26.0–34.0)
MCHC: 34.4 g/dL (ref 30.0–36.0)
MCV: 84.2 fL (ref 78.0–100.0)
Platelets: 242 10*3/uL (ref 150–400)
RBC: 5.18 MIL/uL — AB (ref 3.87–5.11)
RDW: 13 % (ref 11.5–15.5)
WBC: 10.8 10*3/uL — AB (ref 4.0–10.5)

## 2013-02-15 LAB — URINALYSIS, ROUTINE W REFLEX MICROSCOPIC
Bilirubin Urine: NEGATIVE
Glucose, UA: NEGATIVE mg/dL
Hgb urine dipstick: NEGATIVE
KETONES UR: NEGATIVE mg/dL
LEUKOCYTES UA: NEGATIVE
NITRITE: NEGATIVE
PH: 7.5 (ref 5.0–8.0)
Protein, ur: NEGATIVE mg/dL
SPECIFIC GRAVITY, URINE: 1.015 (ref 1.005–1.030)
Urobilinogen, UA: 0.2 mg/dL (ref 0.0–1.0)

## 2013-02-15 LAB — BASIC METABOLIC PANEL
BUN: 12 mg/dL (ref 6–23)
CO2: 27 mEq/L (ref 19–32)
Calcium: 9.9 mg/dL (ref 8.4–10.5)
Chloride: 101 mEq/L (ref 96–112)
Creatinine, Ser: 1.06 mg/dL (ref 0.50–1.10)
GFR, EST AFRICAN AMERICAN: 84 mL/min — AB (ref >90)
GFR, EST NON AFRICAN AMERICAN: 72 mL/min — AB (ref >90)
Glucose, Bld: 75 mg/dL (ref 70–99)
POTASSIUM: 4.4 meq/L (ref 3.7–5.3)
SODIUM: 139 meq/L (ref 137–147)

## 2013-02-15 LAB — HCG, SERUM, QUALITATIVE: PREG SERUM: NEGATIVE

## 2013-02-15 NOTE — Patient Instructions (Signed)
[name]  02/15/2013   Your procedure is scheduled on:  02/19/13  Report to [facility] at 08:50 AM.  Call this number if you have problems the morning of surgery: [phone]   Remember:   Do not eat food or drink liquids after midnight.   Take these medicines the morning of surgery with A SIP OF WATER: None   Do not wear jewelry, make-up or nail polish.  Do not wear lotions, powders, or perfumes.   Do not shave 48 hours prior to surgery. Men may shave face and neck.  Do not bring valuables to the hospital.  [facility] is not responsible for any belongings or valuables.               Contacts, dentures or bridgework may not be worn into surgery.  Leave suitcase in the car. After surgery it may be brought to your room.  For patients admitted to the hospital, discharge time is determined by your treatment team.               Patients discharged the day of surgery will not be allowed to drive home.    Special Instructions: Shower using CHG 2 nights before surgery and the night before surgery.  If you shower the day of surgery use CHG.  Use special wash - you have one bottle of CHG for all showers.  You should use approximately 1/3 of the bottle for each shower.   Please read over the following fact sheets that you were given: Pain Booklet, Surgical Site Infection Prevention, Anesthesia Post-op Instructions and Care and Recovery After Surgery    Supracervical Hysterectomy A supracervical hysterectomy is surgery to remove the top part of the uterus, but not the cervix. You will no longer have menstrual periods or be able to get pregnant after this surgery. The fallopian tubes and ovaries may also be removed (bilateral salpingo-oophorectomy) during this surgery. This surgery is usually performed using a minimally invasive technique called laparoscopy. This technique allows the surgery to be done through small incisions. The minimally invasive technique provides benefits such as less  pain, less risk of infection, and shorter recovery time. LET [facility] CARE PROVIDER KNOW ABOUT:  Any allergies you have.  All medicines you are taking, including vitamins, herbs, eye drops, creams, and over-the-counter medicines.  Previous problems you or members of your family have had with the use of anesthetics.  Any blood disorders you have.  Previous surgeries you have had.  Medical conditions you have. RISKS AND COMPLICATIONS  Generally, this is a safe procedure. However, as with any procedure, complications can occur. Possible complications include:  Bleeding.  Blood clots in the legs or lung.  Infection.  Injury to surrounding organs.  Problems related to anesthesia.  Conversion to an open abdominal surgery.  Additional surgery later to remove the cervix if you have problems with the cervix. BEFORE THE PROCEDURE  Ask your health care provider about changing or stopping your regular medicines.  Do not take aspirin or blood thinners (anticoagulants) for 1 week before the surgery, or as directed by your health care provider.  Do not eat or drink anything for 8 hours before the surgery, or as directed by your health care provider.  Quit smoking if you smoke.  Arrange for a ride home after surgery and for someone to help you at home during recovery. PROCEDURE   You will be given an antibiotic medicine.  An IV tube will be placed in  one of your veins. You will be given medicine to make you sleep (general anesthetic).  A gas (carbon dioxide) will be used to inflate your abdomen. This will allow your surgeon to look inside your abdomen, perform your surgery, and treat any other problems found if necessary.  Three or four small incisions will be made in your abdomen. One of these incisions will be made in the area of your belly button (navel). A thin, flexible tube with a tiny camera and light on the end of it (laparoscope) will be inserted into the incision. The  camera on the laparoscope sends a picture to a TV screen in the operating room. This gives your surgeon a good view inside the abdomen.  Other surgical instruments will be inserted through the other incisions.  The uterus will be cut into small pieces and removed through the small incisions.  Your incisions will be closed. AFTER THE PROCEDURE   You will be taken to a recovery area where your progress will be monitored until you are awake, stable, and taking fluids well. If there are no other problems, you will then be moved to a regular hospital room, or you will be allowed to go home.  You will likely have minimal discomfort after the surgery because the incisions are so small with the laparoscopic technique.  You will be given pain medicine while you are in the hospital and for when you go home.  If a bilateral salpingo-oophorectomy was performed before menopause, you will go through a sudden (abrupt) menopause. This can be helped with hormone medicines. Document Released: 06/29/2007 Document Revised: 10/31/2012 Document Reviewed: 07/13/2012 [facility] Patient Information 2014 [place].    Salpingectomy Salpingectomy, also called tubectomy, is the surgical removal of one of the fallopian tubes. The fallopian tubes are tubes that are connected to the uterus. These tubes transport the egg from the ovary to the uterus. A salpingectomy may be done for various reasons, including:   A tubal (ectopic) pregnancy. This is especially true if the tube ruptures.  An infected fallopian tube.  The need to remove the fallopian tube when removing an ovary with a cyst or tumor.  The need to remove the fallopian tube when removing the uterus.  Cancer of the fallopian tube or nearby organs. Removing one fallopian tube does not prevent you from becoming pregnant. It also does not cause problems with your menstrual periods.  LET [facility] CARE PROVIDER KNOW ABOUT:  Any allergies you  have.  All medicines you are taking, including vitamins, herbs, eye drops, creams, and over-the-counter medicines.  Previous problems you or members of your family have had with the use of anesthetics.  Any blood disorders you have.  Previous surgeries you have had.  Medical conditions you have. RISKS AND COMPLICATIONS  Generally, this is a safe procedure. However, as with any procedure, complications can occur. Possible complications include:  Injury to surrounding organs.  Bleeding.  Infection.  Problems related to anesthesia. BEFORE THE PROCEDURE  Ask your health care provider about changing or stopping your regular medicines. You may need to stop taking certain medicines, such as aspirin or blood thinners, at least 1 week before the surgery.  Do not eat or drink anything for at least 8 hours before the surgery.  If you smoke, do not smoke for at least 2 weeks before the surgery.  Make plans to have someone drive you home after the procedure or after your hospital stay. Also arrange for someone to help  you with activities during recovery. PROCEDURE   You will be given medicine to help you relax before the procedure (sedative). You will then be given medicine to make you sleep through the procedure (general anesthetic). These medicines will be given through an IV access tube that is put into one of your veins.  Once you are asleep, your lower abdomen will be shaved and cleaned. A thin, flexible tube (catheter) will be placed in your bladder.  The surgeon may use a laparoscopic, robotic, or open technique for this surgery:  In the laparoscopic technique, the surgery is done through two small cuts (incisions) in the abdomen. A thin, lighted tube with a tiny camera on the end (laparoscope) is inserted into one of the incisions. The tools needed for the procedure are put through the other incision.  A robotic technique may be chosen to perform complex surgery in a small space.  In the robotic technique, small incisions will be made. A camera and surgical instruments are passed through the incisions. Surgical instruments will be controlled with the help of a robotic arm.  In the open technique, the surgery is done through one large incision in the abdomen.  Using any of these techniques, the surgeon removes the fallopian tube from where it attaches to the uterus. The blood vessels will be clamped and tied.  The surgeon then uses staples or stitches to close the incision or incisions. AFTER THE PROCEDURE   You will be taken to a recovery area where your progress will be monitored for 1 3 hours.  If the laparoscopic technique was used, you may be allowed to go home after several hours. You may have some shoulder pain after the laparoscopic procedure. This is normal and usually goes away in a day or two.  If the open technique was used, you will be admitted to the hospital for a couple of days.  You will be given pain medicine if needed.  The IV access tube and catheter will be removed before you are discharged. Document Released: 05/29/2008 Document Revised: 10/31/2012 Document Reviewed: 07/04/2012 [facility] Patient Information 2014 [place], [place].    PATIENT INSTRUCTIONS POST-ANESTHESIA  IMMEDIATELY FOLLOWING SURGERY:  Do not drive or operate machinery for the first twenty four hours after surgery.  Do not make any important decisions for twenty four hours after surgery or while taking narcotic pain medications or sedatives.  If you develop intractable nausea and vomiting or a severe headache please notify your doctor immediately.  FOLLOW-UP:  Please make an appointment with your surgeon as instructed. You do not need to follow up with anesthesia unless specifically instructed to do so.  WOUND CARE INSTRUCTIONS (if applicable):  Keep a dry clean dressing on the anesthesia/puncture wound site if there is drainage.  Once the wound has quit draining you may leave  it open to air.  Generally you should leave the bandage intact for twenty four hours unless there is drainage.  If the epidural site drains for more than 36-48 hours please call the anesthesia department.  QUESTIONS?:  Please feel free to call your physician or the hospital operator if you have any questions, and they will be happy to assist you.

## 2013-02-16 LAB — TYPE AND SCREEN
ABO/RH(D): A NEG
ANTIBODY SCREEN: NEGATIVE

## 2013-02-18 NOTE — H&P (Signed)
Preoperative History and Physical   [name] is a 28 y.o. G2P2 here for pre op visit. She has history of endometriosis and dysmenorrhea.     Proposed surgery: hysterectomy,supracervical, bilateral salpingectomy and excision of old scar endometriosis planned for 02/19/13      Past Medical History   Diagnosis  Date   .  Hypoglycemia      Past Surgical History   Procedure  Laterality  Date   .  Cesarean section    2009, 2012       x2   .  Tonsillectomy       .  Tubal ligation       .  Mass excision  N/A  07/23/2012       Procedure: EXCISION OF RIGHT LOWER ABDOMINAL MASS, ENDOMETRIAL IMPLANT;  Surgeon: Scherry Ran, MD;  Location: [facility];  Service: General;  Laterality: N/A;    OB History     Grav  Para  Term  Preterm  Abortions  TAB  SAB  Ect  Mult  Living     2  2                       Patient denies any cervical dysplasia or STIs. (Not in a hospital admission)   Allergies   Allergen  Reactions   .  Codeine  Hives and Itching    Social History:  reports that she has never smoked. She has never used smokeless tobacco. She reports that she does not drink alcohol or use illicit drugs. Family History   Problem  Relation  Age of Onset   .  Hypertension  Father     .  Diabetes  Other     .  Heart disease  Other     .  Colon cancer  Other     .  Heart disease  Other        Review of Systems: Noncontributory   PHYSICAL EXAM: Blood pressure 130/82, height 5\' 2"  (1.575 m), weight 193 lb 3.2 oz (87.635 kg), last menstrual period 01/08/2013. General appearance - alert, well appearing, and in no distress Chest - clear to auscultation, no wheezes, rales or rhonchi, symmetric air entry Heart - normal rate and regular rhythm Abdomen - soft, nontender, nondistended, no masses or organomegaly Pelvic - examination not indicated Extremities - peripheral pulses normal, no pedal edema, no clubbing or cyanosis   Labs: Results for orders placed in visit on 02/14/13  (from the past 336 hour(s))   POCT URINE PREGNANCY     Collection Time      02/14/13  4:25 PM       Result  Value  Range     Preg Test, Ur  Negative         Imaging Studies: No results found.   Assessment: Patient Active Problem List     Diagnosis  Date Noted   .  Endometriosis in scar  11/05/2012   .  Dysmenorrhea  11/05/2012      Plan: Patient will undergo surgical management with hysterectomy,supracervical, bilateral salpingectomy and excision of old scar endometriosis planned for 02/19/13      The risks of surgery were discussed in detail with the patient including but not limited to: bleeding which may require transfusion or reoperation; infection which may require antibiotics; injury to surrounding organs which may involve bowel, bladder, ureters ; need for additional procedures including laparoscopy or laparotomy; thromboembolic phenomenon, surgical  site problems and other postoperative/anesthesia complications. Likelihood of success in alleviating the patient's condition was discussed. Routine postoperative instructions will be reviewed with the patient and her family in detail after surgery.  The patient concurred with the proposed plan, giving informed written consent for the surgery.  Patient has been NPO since last night she will remain NPO for procedure.  Anesthesia and OR aware.  Preoperative prophylactic antibiotics and SCDs ordered on call to the OR.      [name] MD 02/14/2013 4:49 PM

## 2013-02-19 ENCOUNTER — Inpatient Hospital Stay (HOSPITAL_COMMUNITY): Payer: BC Managed Care – PPO | Admitting: Anesthesiology

## 2013-02-19 ENCOUNTER — Encounter (HOSPITAL_COMMUNITY)
Admission: RE | Disposition: A | Discharge status: Home / Self Care | Payer: Self-pay | Source: Ambulatory Visit | Attending: Obstetrics and Gynecology

## 2013-02-19 ENCOUNTER — Encounter (HOSPITAL_COMMUNITY): Payer: Self-pay | Admitting: *Deleted

## 2013-02-19 ENCOUNTER — Inpatient Hospital Stay (HOSPITAL_COMMUNITY)
Admission: RE | Admit: 2013-02-19 | Discharge: 2013-02-21 | DRG: 743 | Disposition: A | Discharge status: Home / Self Care | Payer: BC Managed Care – PPO | Source: Ambulatory Visit | Attending: Obstetrics and Gynecology | Admitting: Obstetrics and Gynecology

## 2013-02-19 ENCOUNTER — Encounter (HOSPITAL_COMMUNITY): Payer: BC Managed Care – PPO | Admitting: Anesthesiology

## 2013-02-19 DIAGNOSIS — Z90710 Acquired absence of both cervix and uterus: Secondary | ICD-10-CM | POA: Diagnosis present

## 2013-02-19 DIAGNOSIS — Z833 Family history of diabetes mellitus: Secondary | ICD-10-CM

## 2013-02-19 DIAGNOSIS — Z8249 Family history of ischemic heart disease and other diseases of the circulatory system: Secondary | ICD-10-CM

## 2013-02-19 DIAGNOSIS — N806 Endometriosis in cutaneous scar: Secondary | ICD-10-CM

## 2013-02-19 DIAGNOSIS — N8000 Endometriosis of the uterus, unspecified: Principal | ICD-10-CM | POA: Diagnosis present

## 2013-02-19 DIAGNOSIS — Z800 Family history of malignant neoplasm of digestive organs: Secondary | ICD-10-CM

## 2013-02-19 DIAGNOSIS — K660 Peritoneal adhesions (postprocedural) (postinfection): Secondary | ICD-10-CM | POA: Diagnosis present

## 2013-02-19 DIAGNOSIS — N946 Dysmenorrhea, unspecified: Secondary | ICD-10-CM

## 2013-02-19 DIAGNOSIS — N736 Female pelvic peritoneal adhesions (postinfective): Secondary | ICD-10-CM

## 2013-02-19 DIAGNOSIS — IMO0002 Reserved for concepts with insufficient information to code with codable children: Secondary | ICD-10-CM | POA: Diagnosis present

## 2013-02-19 HISTORY — PX: SCAR REVISION: SHX5285

## 2013-02-19 HISTORY — PX: SUPRACERVICAL ABDOMINAL HYSTERECTOMY: SHX5393

## 2013-02-19 HISTORY — PX: LYSIS OF ADHESION: SHX5961

## 2013-02-19 SURGERY — HYSTERECTOMY, SUPRACERVICAL, ABDOMINAL
Anesthesia: General | Site: Abdomen

## 2013-02-19 MED ORDER — PANTOPRAZOLE SODIUM 40 MG PO TBEC
40.0000 mg | DELAYED_RELEASE_TABLET | Freq: Every day | ORAL | Status: DC
  Administered 2013-02-19 – 2013-02-20 (×2): 40 mg via ORAL
  Filled 2013-02-19 (×2): qty 1

## 2013-02-19 MED ORDER — ONDANSETRON HCL 4 MG/2ML IJ SOLN: 4.0000 mg | Freq: Four times a day (QID) | INTRAMUSCULAR | Status: DC | PRN

## 2013-02-19 MED ORDER — LIDOCAINE HCL (PF) 1 % IJ SOLN
INTRAMUSCULAR | Status: AC
  Filled 2013-02-19: qty 5

## 2013-02-19 MED ORDER — ZOLPIDEM TARTRATE 5 MG PO TABS: 5.0000 mg | ORAL_TABLET | Freq: Every evening | ORAL | Status: DC | PRN

## 2013-02-19 MED ORDER — ROCURONIUM BROMIDE 100 MG/10ML IV SOLN
INTRAVENOUS | Status: DC | PRN
  Administered 2013-02-19: 10 mg via INTRAVENOUS
  Administered 2013-02-19: 50 mg via INTRAVENOUS

## 2013-02-19 MED ORDER — NEOSTIGMINE METHYLSULFATE 1 MG/ML IJ SOLN
INTRAMUSCULAR | Status: AC
  Filled 2013-02-19: qty 1

## 2013-02-19 MED ORDER — LIDOCAINE HCL (PF) 1 % IJ SOLN
INTRAMUSCULAR | Status: AC
Start: 2013-02-19 — End: 2013-02-19
  Filled 2013-02-19: qty 5

## 2013-02-19 MED ORDER — ONDANSETRON HCL 4 MG/2ML IJ SOLN
4.0000 mg | Freq: Once | INTRAMUSCULAR | Status: AC | PRN
  Administered 2013-02-19: 4 mg via INTRAVENOUS
  Filled 2013-02-19: qty 2

## 2013-02-19 MED ORDER — GLYCOPYRROLATE 0.2 MG/ML IJ SOLN
INTRAMUSCULAR | Status: AC
  Filled 2013-02-19: qty 2

## 2013-02-19 MED ORDER — BUPIVACAINE HCL (PF) 0.5 % IJ SOLN
INTRAMUSCULAR | Status: AC
  Filled 2013-02-19: qty 30

## 2013-02-19 MED ORDER — MIDAZOLAM HCL 2 MG/2ML IJ SOLN
INTRAMUSCULAR | Status: AC
  Filled 2013-02-19: qty 2

## 2013-02-19 MED ORDER — FENTANYL CITRATE 0.05 MG/ML IJ SOLN
INTRAMUSCULAR | Status: DC | PRN
  Administered 2013-02-19: 100 ug via INTRAVENOUS
  Administered 2013-02-19: 50 ug via INTRAVENOUS
  Administered 2013-02-19: 100 ug via INTRAVENOUS
  Administered 2013-02-19 (×5): 50 ug via INTRAVENOUS

## 2013-02-19 MED ORDER — KCL IN DEXTROSE-NACL 20-5-0.45 MEQ/L-%-% IV SOLN
INTRAVENOUS | Status: DC
  Administered 2013-02-19 – 2013-02-20 (×2): via INTRAVENOUS

## 2013-02-19 MED ORDER — PROPOFOL 10 MG/ML IV BOLUS
INTRAVENOUS | Status: DC | PRN
  Administered 2013-02-19: 150 mg via INTRAVENOUS

## 2013-02-19 MED ORDER — SIMETHICONE 80 MG PO CHEW
80.0000 mg | CHEWABLE_TABLET | Freq: Four times a day (QID) | ORAL | Status: DC | PRN
  Administered 2013-02-20 – 2013-02-21 (×2): 80 mg via ORAL
  Filled 2013-02-19 (×2): qty 1

## 2013-02-19 MED ORDER — FENTANYL CITRATE 0.05 MG/ML IJ SOLN
25.0000 ug | INTRAMUSCULAR | Status: DC | PRN
  Administered 2013-02-19 (×2): 50 ug via INTRAVENOUS
  Filled 2013-02-19: qty 2

## 2013-02-19 MED ORDER — ACETAMINOPHEN 325 MG PO TABS: 650.0000 mg | ORAL_TABLET | ORAL | Status: DC | PRN

## 2013-02-19 MED ORDER — ROCURONIUM BROMIDE 50 MG/5ML IV SOLN
INTRAVENOUS | Status: AC
Start: 2013-02-19 — End: 2013-02-19
  Filled 2013-02-19: qty 1

## 2013-02-19 MED ORDER — SODIUM CHLORIDE 0.9 % IJ SOLN
9.0000 mL | INTRAMUSCULAR | Status: DC | PRN
Start: 2013-02-19 — End: 2013-02-20

## 2013-02-19 MED ORDER — KETOROLAC TROMETHAMINE 15 MG/ML IJ SOLN
30.0000 mg | Freq: Four times a day (QID) | INTRAMUSCULAR | Status: AC
  Administered 2013-02-19 – 2013-02-20 (×3): 30 mg via INTRAVENOUS
  Filled 2013-02-19 (×5): qty 2

## 2013-02-19 MED ORDER — DIPHENHYDRAMINE HCL 50 MG/ML IJ SOLN
12.5000 mg | Freq: Four times a day (QID) | INTRAMUSCULAR | Status: DC | PRN
  Administered 2013-02-19 – 2013-02-20 (×3): 12.5 mg via INTRAVENOUS
  Filled 2013-02-19 (×3): qty 1

## 2013-02-19 MED ORDER — OXYCODONE-ACETAMINOPHEN 5-325 MG PO TABS
1.0000 | ORAL_TABLET | ORAL | Status: DC | PRN
  Administered 2013-02-20 – 2013-02-21 (×5): 1 via ORAL
  Filled 2013-02-19 (×5): qty 1

## 2013-02-19 MED ORDER — DIPHENHYDRAMINE HCL 12.5 MG/5ML PO ELIX: 12.5000 mg | ORAL_SOLUTION | Freq: Four times a day (QID) | ORAL | Status: DC | PRN

## 2013-02-19 MED ORDER — MIDAZOLAM HCL 5 MG/5ML IJ SOLN
INTRAMUSCULAR | Status: DC | PRN
  Administered 2013-02-19: 2 mg via INTRAVENOUS

## 2013-02-19 MED ORDER — KETOROLAC TROMETHAMINE 15 MG/ML IJ SOLN
30.0000 mg | Freq: Four times a day (QID) | INTRAMUSCULAR | Status: AC
  Administered 2013-02-20: 30 mg via INTRAMUSCULAR

## 2013-02-19 MED ORDER — MIDAZOLAM HCL 2 MG/2ML IJ SOLN
1.0000 mg | INTRAMUSCULAR | Status: DC | PRN
  Administered 2013-02-19 (×2): 2 mg via INTRAVENOUS

## 2013-02-19 MED ORDER — NEOSTIGMINE METHYLSULFATE 1 MG/ML IJ SOLN
INTRAMUSCULAR | Status: DC | PRN
  Administered 2013-02-19: 2 mg via INTRAVENOUS
  Administered 2013-02-19: 1 mg via INTRAVENOUS

## 2013-02-19 MED ORDER — ONDANSETRON HCL 4 MG PO TABS
4.0000 mg | ORAL_TABLET | Freq: Four times a day (QID) | ORAL | Status: DC | PRN
  Administered 2013-02-21: 4 mg via ORAL
  Filled 2013-02-19: qty 1

## 2013-02-19 MED ORDER — PROPOFOL 10 MG/ML IV EMUL
INTRAVENOUS | Status: AC
  Filled 2013-02-19: qty 40

## 2013-02-19 MED ORDER — KETOROLAC TROMETHAMINE 30 MG/ML IJ SOLN
30.0000 mg | Freq: Once | INTRAMUSCULAR | Status: AC
  Administered 2013-02-19: 30 mg via INTRAVENOUS
  Filled 2013-02-19: qty 1

## 2013-02-19 MED ORDER — FENTANYL CITRATE 0.05 MG/ML IJ SOLN
INTRAMUSCULAR | Status: AC
  Filled 2013-02-19: qty 5

## 2013-02-19 MED ORDER — GLYCOPYRROLATE 0.2 MG/ML IJ SOLN
INTRAMUSCULAR | Status: DC | PRN
  Administered 2013-02-19 (×2): 0.2 mg via INTRAVENOUS

## 2013-02-19 MED ORDER — BUPIVACAINE HCL (PF) 0.5 % IJ SOLN
INTRAMUSCULAR | Status: DC | PRN
  Administered 2013-02-19: 19 mL

## 2013-02-19 MED ORDER — DIPHENHYDRAMINE HCL 50 MG/ML IJ SOLN
INTRAMUSCULAR | Status: AC
  Filled 2013-02-19: qty 1

## 2013-02-19 MED ORDER — CEFAZOLIN SODIUM-DEXTROSE 2-3 GM-% IV SOLR
2.0000 g | INTRAVENOUS | Status: AC
  Administered 2013-02-19: 2 g via INTRAVENOUS
  Filled 2013-02-19: qty 50

## 2013-02-19 MED ORDER — DOCUSATE SODIUM 100 MG PO CAPS
100.0000 mg | ORAL_CAPSULE | Freq: Two times a day (BID) | ORAL | Status: DC
  Administered 2013-02-20 (×2): 100 mg via ORAL
  Filled 2013-02-19 (×2): qty 1

## 2013-02-19 MED ORDER — 0.9 % SODIUM CHLORIDE (POUR BTL) OPTIME
TOPICAL | Status: DC | PRN
  Administered 2013-02-19 (×2): 1000 mL

## 2013-02-19 MED ORDER — DIPHENHYDRAMINE HCL 50 MG/ML IJ SOLN
25.0000 mg | Freq: Once | INTRAMUSCULAR | Status: AC
  Administered 2013-02-19: 25 mg via INTRAVENOUS

## 2013-02-19 MED ORDER — LIDOCAINE HCL 1 % IJ SOLN
INTRAMUSCULAR | Status: DC | PRN
  Administered 2013-02-19: 50 mg via INTRADERMAL

## 2013-02-19 MED ORDER — ONDANSETRON HCL 4 MG/2ML IJ SOLN
4.0000 mg | Freq: Once | INTRAMUSCULAR | Status: AC
  Administered 2013-02-19: 4 mg via INTRAVENOUS
  Filled 2013-02-19: qty 2

## 2013-02-19 MED ORDER — ONDANSETRON HCL 4 MG/2ML IJ SOLN
4.0000 mg | Freq: Four times a day (QID) | INTRAMUSCULAR | Status: DC | PRN
  Administered 2013-02-19 – 2013-02-20 (×3): 4 mg via INTRAVENOUS
  Filled 2013-02-19 (×3): qty 2

## 2013-02-19 MED ORDER — LACTATED RINGERS IV SOLN
INTRAVENOUS | Status: DC
  Administered 2013-02-19: 1000 mL via INTRAVENOUS
  Administered 2013-02-19: 12:00:00 via INTRAVENOUS
  Administered 2013-02-19: 1000 mL via INTRAVENOUS
  Administered 2013-02-19: 11:00:00 via INTRAVENOUS

## 2013-02-19 MED ORDER — NALOXONE HCL 0.4 MG/ML IJ SOLN: 0.4000 mg | INTRAMUSCULAR | Status: DC | PRN

## 2013-02-19 MED ORDER — FENTANYL CITRATE 0.05 MG/ML IJ SOLN
INTRAMUSCULAR | Status: AC
Start: 2013-02-19 — End: 2013-02-19
  Filled 2013-02-19: qty 2

## 2013-02-19 MED ORDER — HYDROMORPHONE 0.3 MG/ML IV SOLN
INTRAVENOUS | Status: DC
  Administered 2013-02-19: 15:00:00 via INTRAVENOUS
  Filled 2013-02-19: qty 25

## 2013-02-19 SURGICAL SUPPLY — 65 items
APL SKNCLS STERI-STRIP NONHPOA (GAUZE/BANDAGES/DRESSINGS) ×2
BAG HAMPER (MISCELLANEOUS) ×3 IMPLANT
BENZOIN TINCTURE PRP APPL 2/3 (GAUZE/BANDAGES/DRESSINGS) ×2 IMPLANT
CLOTH BEACON ORANGE TIMEOUT ST (SAFETY) ×3 IMPLANT
COVER LIGHT HANDLE STERIS (MISCELLANEOUS) ×6 IMPLANT
DRAPE WARM FLUID 44X44 (DRAPE) ×3 IMPLANT
DRESSING TELFA 8X3 (GAUZE/BANDAGES/DRESSINGS) ×2 IMPLANT
DURAPREP 26ML APPLICATOR (WOUND CARE) ×3 IMPLANT
ELECT REM PT RETURN 9FT ADLT (ELECTROSURGICAL) ×3
ELECTRODE REM PT RTRN 9FT ADLT (ELECTROSURGICAL) ×2 IMPLANT
EVACUATOR DRAINAGE 10X20 100CC (DRAIN) ×1 IMPLANT
EVACUATOR SILICONE 100CC (DRAIN)
FORMALIN 10 PREFIL 480ML (MISCELLANEOUS) ×3 IMPLANT
GAUZE PACKING 2X5 YD STERILE (GAUZE/BANDAGES/DRESSINGS) ×3 IMPLANT
GLOVE BIOGEL PI IND STRL 7.0 (GLOVE) ×2 IMPLANT
GLOVE BIOGEL PI IND STRL 8.5 (GLOVE) ×1 IMPLANT
GLOVE BIOGEL PI IND STRL 9 (GLOVE) ×2 IMPLANT
GLOVE BIOGEL PI INDICATOR 7.0 (GLOVE) ×2
GLOVE BIOGEL PI INDICATOR 8.5 (GLOVE) ×1
GLOVE BIOGEL PI INDICATOR 9 (GLOVE) ×1
GLOVE ECLIPSE 6.5 STRL STRAW (GLOVE) ×2 IMPLANT
GLOVE ECLIPSE 8.0 STRL XLNG CF (GLOVE) ×2 IMPLANT
GLOVE ECLIPSE 9.0 STRL (GLOVE) ×3 IMPLANT
GLOVE EXAM NITRILE MD LF STRL (GLOVE) ×4 IMPLANT
GLOVE INDICATOR STER SZ 9 (GLOVE) ×3 IMPLANT
GLOVE SS BIOGEL STRL SZ 6.5 (GLOVE) ×2 IMPLANT
GLOVE SUPERSENSE BIOGEL SZ 6.5 (GLOVE) ×2
GOWN SPEC L3 XXLG W/TWL (GOWN DISPOSABLE) ×3 IMPLANT
GOWN STRL REUS W/TWL LRG LVL3 (GOWN DISPOSABLE) ×6 IMPLANT
GOWN STRL REUS W/TWL XL LVL3 (GOWN DISPOSABLE) ×4 IMPLANT
INST SET MAJOR GENERAL (KITS) ×3 IMPLANT
KIT ROOM TURNOVER APOR (KITS) ×3 IMPLANT
MANIFOLD NEPTUNE II (INSTRUMENTS) ×3 IMPLANT
NDL HYPO 25X1 1.5 SAFETY (NEEDLE) IMPLANT
NEEDLE HYPO 25X1 1.5 SAFETY (NEEDLE) ×3 IMPLANT
NS IRRIG 1000ML POUR BTL (IV SOLUTION) ×6 IMPLANT
PACK ABDOMINAL MAJOR (CUSTOM PROCEDURE TRAY) ×3 IMPLANT
PAD ABD 5X9 TENDERSORB (GAUZE/BANDAGES/DRESSINGS) ×5 IMPLANT
PAD ARMBOARD 7.5X6 YLW CONV (MISCELLANEOUS) ×3 IMPLANT
RETRACTOR WND ALEXIS 25 LRG (MISCELLANEOUS) IMPLANT
RTRCTR WOUND ALEXIS 25CM LRG (MISCELLANEOUS)
SET BASIN LINEN APH (SET/KITS/TRAYS/PACK) ×3 IMPLANT
SOL PREP PROV IODINE SCRUB 4OZ (MISCELLANEOUS) ×1 IMPLANT
SPONGE DRAIN TRACH 4X4 STRL 2S (GAUZE/BANDAGES/DRESSINGS) IMPLANT
SPONGE GAUZE 4X4 12PLY (GAUZE/BANDAGES/DRESSINGS) ×3 IMPLANT
SPONGE LAP 18X18 X RAY DECT (DISPOSABLE) ×2 IMPLANT
STAPLER VISISTAT 35W (STAPLE) IMPLANT
STRIP CLOSURE SKIN 1/2X4 (GAUZE/BANDAGES/DRESSINGS) ×4 IMPLANT
SUT CHROMIC 0 CT 1 (SUTURE) ×28 IMPLANT
SUT CHROMIC 2 0 CT 1 (SUTURE) ×6 IMPLANT
SUT CHROMIC GUT BROWN 0 54 (SUTURE) IMPLANT
SUT CHROMIC GUT BROWN 0 54IN (SUTURE)
SUT ETHILON 3 0 FSL (SUTURE) IMPLANT
SUT PDS AB CT VIOLET #0 27IN (SUTURE) IMPLANT
SUT PLAIN CT 1/2CIR 2-0 27IN (SUTURE) ×5 IMPLANT
SUT PROLENE 0 CT 1 30 (SUTURE) ×2 IMPLANT
SUT VIC AB 0 CT1 27 (SUTURE) ×3
SUT VIC AB 0 CT1 27XBRD ANTBC (SUTURE) ×1 IMPLANT
SUT VICRYL 4 0 KS 27 (SUTURE) ×2 IMPLANT
SUT VICRYL AB 2 0 TIES (SUTURE) ×1 IMPLANT
SYR CONTROL 10ML LL (SYRINGE) ×2 IMPLANT
TAPE CLOTH SURG 4X10 WHT LF (GAUZE/BANDAGES/DRESSINGS) ×2 IMPLANT
TOWEL BLUE STERILE X RAY DET (MISCELLANEOUS) ×3 IMPLANT
TOWEL OR 17X26 4PK STRL BLUE (TOWEL DISPOSABLE) ×3 IMPLANT
TRAY FOLEY CATH 16FR SILVER (SET/KITS/TRAYS/PACK) ×3 IMPLANT

## 2013-02-19 NOTE — Op Note (Signed)
/27/2015  12:41 PM  PATIENT:  [name]  28 y.o. female  PRE-OPERATIVE DIAGNOSIS:  dyspareunia, endometriosis  POST-OPERATIVE DIAGNOSIS:  extensive abdominal adhesions, secondary to c-section, dyspareunia, endometriosis  PROCEDURE:  Procedure(s): HYSTERECTOMY SUPRACERVICAL ABDOMINAL (N/A) EXCISION OF SCAR ENDOMETRIOSIS (N/A) LYSIS OF ADHESION (N/A) extensive lysis of intra-abdominal adhesions with release of omental adhesions to the anterior rectus muscles  SURGEON:  Surgeon(s) and Role:    * [name], MD - Primary  PHYSICIAN ASSISTANT:   Details of procedure:. Patient was taken operating room prepped and draped for lower normal surgery. The previously marked lower abdominal incision was performed opening a 35 cm long by 10 cm wide ellipse of skin and removing the skin and fatty tissue to include the previous scar and the suspected fibrosis on the right side. Fibrosis was actually quite minimal on the right side compared to patient's symptomatology. Pfannenstiel opening of the fascia was performed and then with some difficulty we were able to identify the midline and anterior. There were extensive adhesions of the omentum to the anterior abdominal wall and some of the omental adhesions were even out onto the from side of the rectus muscles. Once the peritoneal cavity could be entered and it was identified that the uterine fundus was densely adherent to the anterior abdominal wall as well. Careful dissection required quite a bit of time. The index finger could be used to identify a plane between the lower uterine segment and the anterior abdominal wall. This information we could gradually peel the uterine fundus off of the peritoneum where it was extensively and densely adherent particular on the right side. It was felt that this was the likely source of her right lower quadrant discomfort rather than additional scarring in the subcutaneous space from the previous endometriosis  surgery.. Once the uterine fundus could be freed, the round ligaments can be identified clamped cut and suture ligated. The right utero-ovarian ligament could be identified isolated doubly clamped transected and suture ligated. On the left side the very was closely and in approximation with the left cornu but with care we could identify a sufficient space to track doubly clamped cut and suture ligate it. There was no tube on the left side, and on the right side there was only a tiny tiny fragment of her tube remnant. It was felt that no additional surgery was necessary to remove this tiny remnant, as the concern was that further blood supply to the ovary would be for even more compromised hence the adnexa were left as normal as possible. They were much more relaxed and returned to their normal anatomic position once the uterus was released from the anterior-abdominal wall and freed up. Uterine vessels were clamped cut and suture ligated with curved Heaney clamps, upper cardinal ligaments transected with straight Heaney clamps. Prior to this extensive dissection been necessary to ensure that the bladder adhesions to the anterior uterus were freed. We were able to dissect sharply staying close to the uterus and were able to free of well below the old C-section incision line into the uterine cavity. The uterus was amputated off the cervix with approximately 2 cm of cervix and lower uterine segment left in place. The cuff was oversewn with interrupted 0 Vicryl. Urine was clear. At no time was there any suspicion of bladder injury. Elvis was irrigated pedicles inspected confirmed as hemostatic. The left side required one additional suture along the edge of the transected utero-ovarian ligaments complete hemostasis. Pelvis was irrigated, and  anterior peritoneum and dressed. The omentum was still adherent to the rectus muscles and to the anterior peritoneum on the right side. This was sharply dissected free to return to  normal anatomy and commonly omentum then rule out her turned to the abdomen, carefully inspected to ensure that there was hemostasis on the resected surfaces, and anterior peritoneum pulled together in the midline with running 2-0 chromic, the fascia closed with running 0 Vicryl.  Subcutaneous fatty space was reapproximated using interrupted 20 plain, resulting in good tissue its approximation then subcuticular 4-0 Vicryl close the skin Steri-Strips were applied dressing with pressure applied and patient recovery in stable condition Marcaine 20 cc was injected into the skin and subcutaneous fatty space prior to dressing placement sponge and needle counts correct.  EBL 100 cc. condition to recovery room good.

## 2013-02-19 NOTE — Brief Op Note (Signed)
02/19/2013  12:41 PM  PATIENT:  [name]  28 y.o. female  PRE-OPERATIVE DIAGNOSIS:  dyspareunia, endometriosis  POST-OPERATIVE DIAGNOSIS:  extensive abdominal adhesions, secondary to c-section, dyspareunia, endometriosis  PROCEDURE:  Procedure(s): HYSTERECTOMY SUPRACERVICAL ABDOMINAL (N/A) EXCISION OF SCAR ENDOMETRIOSIS (N/A) LYSIS OF ADHESION (N/A) extensive lysis of intra-abdominal adhesions with release of omental adhesions to the anterior rectus muscles  SURGEON:  Surgeon(s) and Role:    * [name], MD - Primary  PHYSICIAN ASSISTANT:   ASSISTANTS: Ashley RNFA   ANESTHESIA:   local and general  EBL:  Total I/O In: 2400 [I.V.:2400] Out: 150 [Urine:50; Blood:100]  BLOOD ADMINISTERED:none  DRAINS: Urinary Catheter (Foley)   LOCAL MEDICATIONS USED:  MARCAINE    and Amount: 20 ml  SPECIMEN:  Source of Specimen:  Skin abdominal fat, uterus body without cervix  DISPOSITION OF SPECIMEN:  PATHOLOGY  COUNTS:  YES  TOURNIQUET:  * No tourniquets in log *  DICTATION: .Dragon Dictation  PLAN OF CARE: Admit to inpatient   PATIENT DISPOSITION:  PACU - hemodynamically stable.   Delay start of Pharmacological VTE agent (>24hrs) due to surgical blood loss or risk of bleeding: not applicable

## 2013-02-19 NOTE — Anesthesia Preprocedure Evaluation (Signed)
Anesthesia Evaluation  Patient identified by MRN, date of birth, ID band Patient awake    Reviewed: Allergy & Precautions, H&P , NPO status , Patient's Chart, lab work & pertinent test results  Airway Mallampati: I TM Distance: >3 FB     Dental  (+) Teeth Intact   Pulmonary neg pulmonary ROS,  breath sounds clear to auscultation        Cardiovascular negative cardio ROS  Rhythm:Regular Rate:Normal     Neuro/Psych    GI/Hepatic negative GI ROS,   Endo/Other  Hx hypoglycemia   Renal/GU      Musculoskeletal   Abdominal   Peds  Hematology   Anesthesia Other Findings   Reproductive/Obstetrics                           Anesthesia Physical Anesthesia Plan  ASA: I  Anesthesia Plan: General   Post-op Pain Management:    Induction: Intravenous  Airway Management Planned: Oral ETT  Additional Equipment:   Intra-op Plan:   Post-operative Plan: Extubation in OR  Informed Consent: I have reviewed the patients History and Physical, chart, labs and discussed the procedure including the risks, benefits and alternatives for the proposed anesthesia with the patient or authorized representative who has indicated his/her understanding and acceptance.     Plan Discussed with:   Anesthesia Plan Comments:         Anesthesia Quick Evaluation

## 2013-02-19 NOTE — Anesthesia Postprocedure Evaluation (Signed)
  Anesthesia Post-op Note  Patient: [name]  Procedure(s) Performed: Procedure(s): HYSTERECTOMY SUPRACERVICAL ABDOMINAL (N/A) EXCISION OF SCAR ENDOMETRIOSIS (N/A) LYSIS OF ADHESION (N/A)  Patient Location: PACU  Anesthesia Type:General  Level of Consciousness: awake, alert , oriented and patient cooperative  Airway and Oxygen Therapy: Patient Spontanous Breathing  Post-op Pain: 3 /10, mild  Post-op Assessment: Post-op Vital signs reviewed, Patient's Cardiovascular Status Stable, Respiratory Function Stable, Patent Airway, No signs of Nausea or vomiting and Pain level controlled  Post-op Vital Signs: Reviewed and stable  Complications: No apparent anesthesia complications

## 2013-02-19 NOTE — Interval H&P Note (Signed)
History and Physical Interval Note:  02/19/2013 7:29 AM  [name]  has presented today for surgery, with the diagnosis of dyspareunia endometriosis  The various methods of treatment have been discussed with the patient and family. After consideration of risks, benefits and other options for treatment, the patient has consented to  Procedure(s): HYSTERECTOMY SUPRACERVICAL ABDOMINAL (N/A) BILATERAL SALPINGECTOMY (Bilateral) EXCISION OF SCAR ENDOMETRIOSIS (N/A) as a surgical intervention . The entire pfannensteil incision will be revised in order to achieve symmetry after excision of the right lower quadrant area of prior biosy proven scar endometriosis  The patient's history has been reviewed, patient examined, no change in status, stable for surgery.  I have reviewed the patient's chart and labs.  Questions were answered to the patient's satisfaction.     [name]

## 2013-02-19 NOTE — Anesthesia Procedure Notes (Signed)
Procedure Name: Intubation Date/Time: 02/19/2013 10:21 AM Performed by: Charmaine Downs Pre-anesthesia Checklist: Emergency Drugs available, Suction available, Patient being monitored and Patient identified Patient Re-evaluated:Patient Re-evaluated prior to inductionOxygen Delivery Method: Circle system utilized Preoxygenation: Pre-oxygenation with 100% oxygen Intubation Type: IV induction Ventilation: Mask ventilation without difficulty Laryngoscope Size: Mac and 3 Grade View: Grade I Tube type: Oral Tube size: 7.0 mm Number of attempts: 1 Airway Equipment and Method: Stylet and Oral airway Placement Confirmation: positive ETCO2,  breath sounds checked- equal and bilateral and ETT inserted through vocal cords under direct vision Secured at: 22 cm Tube secured with: Tape Dental Injury: Teeth and Oropharynx as per pre-operative assessment

## 2013-02-19 NOTE — Transfer of Care (Signed)
Immediate Anesthesia Transfer of Care Note  Patient: [name]  Procedure(s) Performed: Procedure(s): HYSTERECTOMY SUPRACERVICAL ABDOMINAL (N/A) EXCISION OF SCAR ENDOMETRIOSIS (N/A) LYSIS OF ADHESION (N/A)  Patient Location: PACU  Anesthesia Type:General  Level of Consciousness: awake and patient cooperative  Airway & Oxygen Therapy: Patient Spontanous Breathing and Patient connected to face mask oxygen  Post-op Assessment: Report given to PACU RN, Post -op Vital signs reviewed and stable and Patient moving all extremities  Post vital signs: Reviewed and stable  Complications: No apparent anesthesia complications

## 2013-02-20 ENCOUNTER — Encounter (HOSPITAL_COMMUNITY): Payer: Self-pay | Admitting: Obstetrics and Gynecology

## 2013-02-20 LAB — CBC
HEMATOCRIT: 36.5 % (ref 36.0–46.0)
HEMOGLOBIN: 12.6 g/dL (ref 12.0–15.0)
MCH: 29.7 pg (ref 26.0–34.0)
MCHC: 34.5 g/dL (ref 30.0–36.0)
MCV: 86.1 fL (ref 78.0–100.0)
Platelets: 220 10*3/uL (ref 150–400)
RBC: 4.24 MIL/uL (ref 3.87–5.11)
RDW: 13.1 % (ref 11.5–15.5)
WBC: 12.4 10*3/uL — AB (ref 4.0–10.5)

## 2013-02-20 MED ORDER — DIPHENHYDRAMINE HCL 50 MG/ML IJ SOLN
12.5000 mg | Freq: Four times a day (QID) | INTRAMUSCULAR | Status: DC | PRN
  Administered 2013-02-20 – 2013-02-21 (×3): 12.5 mg via INTRAVENOUS
  Filled 2013-02-20 (×3): qty 1

## 2013-02-20 MED ORDER — DIMENHYDRINATE 50 MG/ML IJ SOLN: 12.5000 mg | Freq: Four times a day (QID) | INTRAMUSCULAR | Status: DC | PRN

## 2013-02-20 NOTE — Care Management Note (Addendum)
    Page 1 of 1   02/21/2013     8:55:28 AM   CARE MANAGEMENT NOTE 02/21/2013  Patient:  [name], [name]   Account Number:  [id number]  Date Initiated:  02/20/2013  Documentation initiated by:  Theophilus Kinds  Subjective/Objective Assessment:   Pt admitted from home s/p abd hyst. Pt lives with her husband and will return home at discharge. Pt is independent with ADL's.     Action/Plan:   No CM needs noted.   Anticipated DC Date:  02/21/2013   Anticipated DC Plan:  [place]  CM consult      Choice offered to / List presented to:             Status of service:  Completed, signed off Medicare Important Message given?   (If response is "NO", the following Medicare IM given date fields will be blank) Date Medicare IM given:   Date Additional Medicare IM given:    Discharge Disposition:  HOME/SELF CARE  Per UR Regulation:    If discussed at Long Length of Stay Meetings, dates discussed:    Comments:  02/21/13 [place], RN BSN CM Pt discharged home today. No CM needs noted.  02/20/13 [place], RN BSN CM

## 2013-02-20 NOTE — Progress Notes (Signed)
UR chart review completed.  

## 2013-02-20 NOTE — Progress Notes (Signed)
1 Day Post-Op Procedure(s) (LRB): HYSTERECTOMY SUPRACERVICAL ABDOMINAL (N/A) EXCISION OF SCAR ENDOMETRIOSIS (N/A) LYSIS OF ADHESION (N/A)  Subjective: Patient reports incisional pain, tolerating PO and no problems voiding.    Objective: I have reviewed patient's vital signs, intake and output and labs.  General: alert, cooperative and no distress GI: soft, non-tender; bowel sounds normal; no masses,  no organomegaly and incision: clean, dry and dressing in place will d/c iv, and d.c pca CBC    Component Value Date/Time   WBC 12.4* 02/20/2013 0544   RBC 4.24 02/20/2013 0544   HGB 12.6 02/20/2013 0544   HCT 36.5 02/20/2013 0544   PLT 220 02/20/2013 0544   MCV 86.1 02/20/2013 0544   MCH 29.7 02/20/2013 0544   MCHC 34.5 02/20/2013 0544   RDW 13.1 02/20/2013 0544   LYMPHSABS 2.2 07/20/2012 0817   MONOABS 0.7 07/20/2012 0817   EOSABS 0.1 07/20/2012 0817   BASOSABS 0.0 07/20/2012 0817     Intake/Output Summary (Last 24 hours) at 02/20/13 0839 Last data filed at 02/20/13 0643  Gross per 24 hour  Intake 4221.25 ml  Output   1575 ml  Net 2646.25 ml     Assessment: s/p Procedure(s): HYSTERECTOMY SUPRACERVICAL ABDOMINAL (N/A) EXCISION OF SCAR ENDOMETRIOSIS (N/A) LYSIS OF ADHESION (N/A): stable and progressing well  Plan: Advance diet Discontinue IV fluids consider d/c in am, unless pt able to leave this pm.rechk at 5 pm  LOS: 1 day    Abra Lingenfelter V 02/20/2013, 8:36 AM

## 2013-02-21 DIAGNOSIS — N856 Intrauterine synechiae: Secondary | ICD-10-CM

## 2013-02-21 DIAGNOSIS — N8000 Endometriosis of the uterus, unspecified: Principal | ICD-10-CM

## 2013-02-21 DIAGNOSIS — IMO0002 Reserved for concepts with insufficient information to code with codable children: Secondary | ICD-10-CM

## 2013-02-21 DIAGNOSIS — N736 Female pelvic peritoneal adhesions (postinfective): Secondary | ICD-10-CM

## 2013-02-21 DIAGNOSIS — L905 Scar conditions and fibrosis of skin: Secondary | ICD-10-CM

## 2013-02-21 MED ORDER — DSS 100 MG PO CAPS: 100.0000 mg | ORAL_CAPSULE | Freq: Two times a day (BID) | ORAL | Status: DC

## 2013-02-21 MED ORDER — ONDANSETRON HCL 4 MG PO TABS: 4.0000 mg | ORAL_TABLET | Freq: Four times a day (QID) | ORAL | Status: DC | PRN

## 2013-02-21 MED ORDER — OXYCODONE-ACETAMINOPHEN 5-325 MG PO TABS: 1.0000 | ORAL_TABLET | ORAL | Status: DC | PRN

## 2013-02-21 NOTE — Discharge Instructions (Signed)
Hysterectomy °Care After °Refer to this sheet in the next few weeks. These instructions provide you with information on caring for yourself after your procedure. Your caregiver may also give you more specific instructions. Your treatment has been planned according to current medical practices, but problems sometimes occur. Call your caregiver if you have any problems or questions after your procedure. °HOME CARE INSTRUCTIONS  °Healing will take time. You may have discomfort, tenderness, swelling, and bruising at the surgical site for about 2 weeks. This is normal and will get better as time goes on. °· Only take over-the-counter or prescription medicines for pain, discomfort, or fever as directed by your caregiver. °· Do not take aspirin. It can cause bleeding. °· Do not drive when taking pain medicine. °· Follow your caregiver's advice regarding exercise, lifting, driving, and general activities. °· Resume your usual diet as directed and allowed. °· Get plenty of rest and sleep. °· Do not douche, use tampons, or have sexual intercourse for at least 6 weeks or until your caregiver gives you permission. °· Change your bandages (dressings) as directed by your caregiver. °· Monitor your temperature. °· Take showers instead of baths for 2 to 3 weeks. °· Do not drink alcohol until your caregiver gives you permission. °· If you are constipated, you may take a mild laxative with your caregiver's permission. Bran foods may help with constipation problems. Drinking enough fluids to keep your urine clear or pale yellow may help as well. °· Try to have someone home with you for 1 or 2 weeks to help around the house. °· Keep all of your follow-up appointments as directed by your caregiver. °SEEK MEDICAL CARE IF:  °· You have swelling, redness, or increasing pain in the surgical cut (incision) area. °· You have pus coming from the incision. °· You notice a bad smell coming from the incision or dressing. °· You have swelling,  redness, or pain around the intravenous (IV) site. °· Your incision breaks open. °· You feel dizzy or lightheaded. °· You have pain or bleeding when you urinate. °· You have persistent diarrhea. °· You have persistent nausea and vomiting. °· You have abnormal vaginal discharge. °· You have a rash. °· You have any type of abnormal reaction or develop an allergy to your medicine. °· Your pain is not controlled with your prescribed medicine. °SEEK IMMEDIATE MEDICAL CARE IF:  °· You have a fever. °· You have severe abdominal pain. °· You have chest pain. °· You have shortness of breath. °· You faint. °· You have pain, swelling, or redness of your leg. °· You have heavy vaginal bleeding with blood clots. °MAKE SURE YOU: °· Understand these instructions. °· Will watch your condition. °· Will get help right away if you are not doing well or get worse. °Document Released: 07/30/2004 Document Revised: 04/04/2011 Document Reviewed: 08/27/2010 °ExitCare® Patient Information ©2014 [employer]. ° °

## 2013-02-21 NOTE — Discharge Summary (Signed)
Physician Discharge Summary  Patient ID: [name] MRN: [id number] DOB/AGE: 19-Sep-1985 28 y.o.  Admit date: 02/19/2013 Discharge date: 02/21/2013  Admission Diagnoses: Abdominal wall endometriosis, dysmenorrhea dyspareunia  Discharge Diagnoses: Abdominal wall fibrosis no endometriosis identified Extensive pelvic peritoneal adhesions Adenomyosis of uterus Active Problems:   Pelvic peritoneal adhesions, female   Status post hysterectomy   Discharged Condition: good  Hospital Course: This 28 year old female was admitted through day surgery after bowel prep for abdominal hysterectomy wide excision of old scarring to eliminate suspected endometriosis and supracervical hysterectomy. Tubes removed for was planned. Hysterectomy was notable for extensive intra-abdominal adhesions. The omentum was stuck to the rectus muscles, and the uterus was stuck to the anterior abdominal wall. Adhesio lysis was extensive, approximately 50% of the surgical procedure, then supracervical hysterectomy performed. Salpingectomy was not necessary as the left tube was absent from prior to sterilization, and the right tube with has essentially no remnant left. Efforts were made to reapproximate the peritoneum and await to prevent future re\re adhesion. Patient did well postoperatively was stable with hemoglobin 12.6 postoperatively afebrile with tolerance of regular diet and discharge on postop day 2. She had slight nauseous and some Zofran was ordered as well as stools (pain medicine  Consults: None  Significant Diagnostic Studies: labs:  CBC    Component Value Date/Time   WBC 12.4* 02/20/2013 0544   RBC 4.24 02/20/2013 0544   HGB 12.6 02/20/2013 0544   HCT 36.5 02/20/2013 0544   PLT 220 02/20/2013 0544   MCV 86.1 02/20/2013 0544   MCH 29.7 02/20/2013 0544   MCHC 34.5 02/20/2013 0544   RDW 13.1 02/20/2013 0544   LYMPHSABS 2.2 07/20/2012 0817   MONOABS 0.7 07/20/2012 0817   EOSABS 0.1 07/20/2012 0817   BASOSABS 0.0  07/20/2012 0817       Treatments: surgery: Abdominal supracervical hysterectomy extensive lysis of adhesions wide excision of old cicatrix  Discharge Exam: Blood pressure 110/68, pulse 68, temperature 97.7 F (36.5 C), temperature source Oral, resp. rate 18, height 5\' 2"  (1.575 m), weight 193 lb (87.544 kg), last menstrual period 01/08/2013, SpO2 98.00%. General appearance: alert, cooperative, appears stated age and no distress Resp: clear to auscultation bilaterally Cardio: regular rate and rhythm GI: soft, non-tender; bowel sounds normal; no masses,  no organomegaly Skin: Skin color, texture, turgor normal. No rashes or lesions  Disposition: 01-Home or Self Care  Discharge Orders   Future Appointments Provider Department Dept Phone   02/26/2013 3:45 PM [name], MD [facility] [phone]   Future Orders Complete By Expires   Call MD for:  temperature >100.4  As directed    Diet - low sodium heart healthy  As directed    Discharge instructions  As directed    Comments:     General Gynecological Post-Operative Instructions You may expect to feel dizzy, weak, and drowsy for as long as 24 hours after receiving the medicine that made you sleep (anesthetic). The following information pertains to your recovery period for the first 24 hours following surgery.  Do not drive a car, ride a bicycle, participate in physical activities, or take public transportation until you are done taking narcotic pain medicines or as directed by your caregiver.  Do not drink alcohol or take tranquilizers.  Do not take medicine that has not been prescribed by your caregiver.  Do not sign important papers or make important decisions while on narcotic pain medicines.  Have a responsible person with you.  CARE OF INCISION  Keep incision clean and dry. Take showers instead of baths until your caregiver gives you permission to take baths. Check with your caregiver if you have tubes coming from the  wound site.  Avoid heavy lifting (more than 10 pounds/4.5 kilograms), pushing, or pulling.  Avoid activities that may risk injury to your surgical site.  Only take over-the-counter or prescription medicines for pain, discomfort, or fever as directed by your caregiver. Do not take aspirin. It can make you bleed. Take medicines (antibiotics) that kill germs as directed.  Call the office or go to the MAU if:  You feel sick to your stomach (nauseous).  You start to throw up (vomit).  You have trouble eating or drinking.  You have an oral temperature above 100.4.  You have constipation that is not helped by adjusting diet or increasing fluid intake. Pain medicines are a common cause of constipation.  SEEK IMMEDIATE MEDICAL CARE IF:  You have persistent dizziness.  You have difficulty breathing or a congested sounding (croupy) cough.  You have an oral temperature above 102.5, not controlled by medicine.  There is increasing pain or tenderness near or in the surgical site.  ExitCare Patient Information 2011 [place].   Discharge wound care:  As directed    Comments:     Leave Steri-Strips in place for a week notify my office of any bleeding or increased discharge or redness, or temperature elevation   Increase activity slowly  As directed    Sexual Activity Restrictions  As directed    Comments:     None x6 weeks       Medication List         DSS 100 MG Caps  Take 100 mg by mouth 2 (two) times daily.     ibuprofen 200 MG tablet  Commonly known as:  ADVIL,MOTRIN  Take 400 mg by mouth every 6 (six) hours as needed for pain.     ondansetron 4 MG tablet  Commonly known as:  ZOFRAN  Take 1 tablet (4 mg total) by mouth every 6 (six) hours as needed for nausea.     oxyCODONE-acetaminophen 5-325 MG per tablet  Commonly known as:  PERCOCET/ROXICET  Take 1-2 tablets by mouth every 4 (four) hours as needed for severe pain (moderate to severe pain (when tolerating fluids)).            Follow-up Information   Follow up with [name], MD In 2 weeks. (Or earlier as needed if symptoms worsen)    Specialties:  Obstetrics and Gynecology, Radiology   Contact information:   [place] [address] [phone]       Signed: [name] 02/21/2013, 8:20 AM

## 2013-02-25 ENCOUNTER — Encounter (HOSPITAL_COMMUNITY): Payer: Self-pay | Admitting: Emergency Medicine

## 2013-02-25 ENCOUNTER — Emergency Department (HOSPITAL_COMMUNITY)
Admission: EM | Admit: 2013-02-25 | Discharge: 2013-02-25 | Disposition: A | Discharge status: Home / Self Care | Payer: BC Managed Care – PPO | Attending: Emergency Medicine | Admitting: Emergency Medicine

## 2013-02-25 DIAGNOSIS — Z8639 Personal history of other endocrine, nutritional and metabolic disease: Secondary | ICD-10-CM | POA: Insufficient documentation

## 2013-02-25 DIAGNOSIS — Z9889 Other specified postprocedural states: Secondary | ICD-10-CM | POA: Insufficient documentation

## 2013-02-25 DIAGNOSIS — R102 Pelvic and perineal pain: Secondary | ICD-10-CM

## 2013-02-25 DIAGNOSIS — Z9851 Tubal ligation status: Secondary | ICD-10-CM | POA: Insufficient documentation

## 2013-02-25 DIAGNOSIS — Z9089 Acquired absence of other organs: Secondary | ICD-10-CM | POA: Insufficient documentation

## 2013-02-25 DIAGNOSIS — N949 Unspecified condition associated with female genital organs and menstrual cycle: Secondary | ICD-10-CM | POA: Insufficient documentation

## 2013-02-25 DIAGNOSIS — Z862 Personal history of diseases of the blood and blood-forming organs and certain disorders involving the immune mechanism: Secondary | ICD-10-CM | POA: Insufficient documentation

## 2013-02-25 DIAGNOSIS — Z90710 Acquired absence of both cervix and uterus: Secondary | ICD-10-CM | POA: Insufficient documentation

## 2013-02-25 DIAGNOSIS — Z79899 Other long term (current) drug therapy: Secondary | ICD-10-CM | POA: Insufficient documentation

## 2013-02-25 DIAGNOSIS — G8918 Other acute postprocedural pain: Secondary | ICD-10-CM | POA: Insufficient documentation

## 2013-02-25 LAB — BASIC METABOLIC PANEL
BUN: 11 mg/dL (ref 6–23)
CALCIUM: 9.5 mg/dL (ref 8.4–10.5)
CO2: 28 meq/L (ref 19–32)
Chloride: 100 mEq/L (ref 96–112)
Creatinine, Ser: 1.04 mg/dL (ref 0.50–1.10)
GFR calc Af Amer: 86 mL/min — ABNORMAL LOW (ref >90)
GFR calc non Af Amer: 74 mL/min — ABNORMAL LOW (ref >90)
Glucose, Bld: 101 mg/dL — ABNORMAL HIGH (ref 70–99)
Potassium: 4.3 mEq/L (ref 3.7–5.3)
SODIUM: 139 meq/L (ref 137–147)

## 2013-02-25 LAB — CBC WITH DIFFERENTIAL/PLATELET
BASOS ABS: 0 10*3/uL (ref 0.0–0.1)
Basophils Relative: 0 % (ref 0–1)
EOS PCT: 2 % (ref 0–5)
Eosinophils Absolute: 0.2 10*3/uL (ref 0.0–0.7)
HCT: 40.3 % (ref 36.0–46.0)
Hemoglobin: 13.8 g/dL (ref 12.0–15.0)
LYMPHS PCT: 17 % (ref 12–46)
Lymphs Abs: 1.7 10*3/uL (ref 0.7–4.0)
MCH: 29.4 pg (ref 26.0–34.0)
MCHC: 34.2 g/dL (ref 30.0–36.0)
MCV: 85.7 fL (ref 78.0–100.0)
Monocytes Absolute: 0.7 10*3/uL (ref 0.1–1.0)
Monocytes Relative: 7 % (ref 3–12)
NEUTROS ABS: 7.4 10*3/uL (ref 1.7–7.7)
Neutrophils Relative %: 74 % (ref 43–77)
Platelets: 307 10*3/uL (ref 150–400)
RBC: 4.7 MIL/uL (ref 3.87–5.11)
RDW: 12.5 % (ref 11.5–15.5)
WBC: 9.9 10*3/uL (ref 4.0–10.5)

## 2013-02-25 MED ORDER — HYDROMORPHONE HCL PF 1 MG/ML IJ SOLN
1.0000 mg | Freq: Once | INTRAMUSCULAR | Status: AC
  Administered 2013-02-25: 1 mg via INTRAVENOUS
  Filled 2013-02-25: qty 1

## 2013-02-25 MED ORDER — ONDANSETRON HCL 4 MG/2ML IJ SOLN
4.0000 mg | Freq: Once | INTRAMUSCULAR | Status: DC
  Filled 2013-02-25: qty 2

## 2013-02-25 MED ORDER — DIPHENHYDRAMINE HCL 50 MG/ML IJ SOLN
25.0000 mg | Freq: Once | INTRAMUSCULAR | Status: AC
  Administered 2013-02-25: 25 mg via INTRAVENOUS
  Filled 2013-02-25: qty 1

## 2013-02-25 MED ORDER — ONDANSETRON HCL 4 MG/2ML IJ SOLN
4.0000 mg | Freq: Once | INTRAMUSCULAR | Status: AC
  Administered 2013-02-25: 4 mg via INTRAVENOUS

## 2013-02-25 NOTE — ED Provider Notes (Signed)
CSN: [id number]     Arrival date & time 02/25/13  2051 History  This chart was scribed for Maudry Diego, MD by Zettie Pho, ED Scribe. This patient was seen in room [facility]06/[facility]06 and the patient's care was started at 9:21 PM.    Chief Complaint  Patient presents with  . Follow-up  . Vaginal Pain   Patient is a 28 y.o. female presenting with vaginal pain. The history is provided by the patient. No language interpreter was used.  Vaginal Pain This is a new problem. The current episode started 3 to 5 hours ago. The problem occurs constantly. The problem has not changed since onset.Pertinent negatives include no chest pain, no abdominal pain and no headaches. Nothing aggravates the symptoms. Nothing relieves the symptoms. Treatments tried: Percocet. The treatment provided no relief.   HPI Comments: [name] is a 28 y.o. female who presents to the Emergency Department complaining of a constant, severe vaginal pain onset earlier tonight. Patient had a hysterectomy about 1.5 weeks ago for endometriosis and was discharged with Percocet, which she states has not been able to provide relief of her current pain. She states that she has an appointment to follow up with the performing surgeon (Dr. Glo Herring) tomorrow. She denies any vaginal bleeding, fever, chills. Patient also has a history of Cesarean section, tubal ligation, RLQ abdominal mass excision. Patient reports an allergy to codeine.   Past Medical History  Diagnosis Date  . Hypoglycemia    Past Surgical History  Procedure Laterality Date  . Cesarean section  2009, 2012    x2  . Tonsillectomy    . Tubal ligation    . Mass excision N/A 07/23/2012    Procedure: EXCISION OF RIGHT LOWER ABDOMINAL MASS, ENDOMETRIAL IMPLANT;  Surgeon: Scherry Ran, MD;  Location: [facility];  Service: General;  Laterality: N/A;  . Supracervical abdominal hysterectomy N/A 02/19/2013    Procedure: HYSTERECTOMY SUPRACERVICAL ABDOMINAL;  Surgeon: [name], MD;  Location: [facility];  Service: Gynecology;  Laterality: N/A;  . Scar revision N/A 02/19/2013    Procedure: EXCISION OF SCAR ENDOMETRIOSIS;  Surgeon: [name], MD;  Location: [facility];  Service: Gynecology;  Laterality: N/A;  . Lysis of adhesion N/A 02/19/2013    Procedure: LYSIS OF ADHESION;  Surgeon: [name], MD;  Location: [facility];  Service: Gynecology;  Laterality: N/A;   Family History  Problem Relation Age of Onset  . Hypertension Father   . Diabetes Other   . Heart disease Other   . Colon cancer Other   . Heart disease Other    History  Substance Use Topics  . Smoking status: Never Smoker   . Smokeless tobacco: Never Used  . Alcohol Use: No   OB History   Grav Para Term Preterm Abortions TAB SAB Ect Mult Living   2 2             Review of Systems  Constitutional: Negative for fever, chills, appetite change and fatigue.  HENT: Negative for congestion, ear discharge and sinus pressure.   Eyes: Negative for discharge.  Respiratory: Negative for cough.   Cardiovascular: Negative for chest pain.  Gastrointestinal: Negative for abdominal pain and diarrhea.  Genitourinary: Positive for vaginal pain. Negative for frequency, hematuria and vaginal bleeding.  Musculoskeletal: Negative for back pain.  Skin: Negative for rash.  Neurological: Negative for seizures and headaches.  Psychiatric/Behavioral: Negative for hallucinations.    Allergies  Codeine  Home Medications  Current Outpatient Rx  Name  Route  Sig  Dispense  Refill  . docusate sodium 100 MG CAPS   Oral   Take 100 mg by mouth 2 (two) times daily.   30 capsule   When necessary   . ibuprofen (ADVIL,MOTRIN) 200 MG tablet   Oral   Take 400 mg by mouth every 6 (six) hours as needed for pain.         Marland Kitchen ondansetron (ZOFRAN) 4 MG tablet   Oral   Take 1 tablet (4 mg total) by mouth every 6 (six) hours as needed for nausea.   20 tablet   0   . oxyCODONE-acetaminophen (PERCOCET/ROXICET)  5-325 MG per tablet   Oral   Take 1-2 tablets by mouth every 4 (four) hours as needed for severe pain (moderate to severe pain (when tolerating fluids)).   30 tablet   0    Triage Vitals: BP 126/78  Pulse 88  Temp(Src) 98.2 F (36.8 C) (Oral)  Resp 16  Ht 5\' 2"  (1.575 m)  Wt 185 lb (83.915 kg)  BMI 33.83 kg/m2  SpO2 98%  LMP 01/08/2013  Physical Exam  Constitutional: She is oriented to person, place, and time. She appears well-developed.  HENT:  Head: Normocephalic.  Eyes: Conjunctivae and EOM are normal. No scleral icterus.  Neck: Neck supple. No thyromegaly present.  Cardiovascular: Normal rate and regular rhythm.  Exam reveals no gallop and no friction rub.   No murmur heard. Pulmonary/Chest: No stridor. She has no wheezes. She has no rales. She exhibits no tenderness.  Abdominal: She exhibits no distension. There is tenderness. There is no rebound.  Tenderness to palpation to the suprapubic region.   Musculoskeletal: Normal range of motion. She exhibits no edema.  Lymphadenopathy:    She has no cervical adenopathy.  Neurological: She is oriented to person, place, and time. She exhibits normal muscle tone. Coordination normal.  Skin: No rash noted. No erythema.  Psychiatric: She has a normal mood and affect. Her behavior is normal.    ED Course  Procedures (including critical care time)  DIAGNOSTIC STUDIES: Oxygen Saturation is 98% on room air, normal by my interpretation.    COORDINATION OF CARE: 9:23 PM- Will order blood labs. Will order Dilaudid to manage symptoms. Discussed treatment plan with patient at bedside and patient verbalized agreement.     Labs Review Labs Reviewed  BASIC METABOLIC PANEL - Abnormal; Notable for the following:    Glucose, Bld 101 (*)    GFR calc non Af Amer 74 (*)    GFR calc Af Amer 86 (*)    All other components within normal limits  CBC WITH DIFFERENTIAL   Imaging Review No results found.  EKG Interpretation   None        MDM  Pelvic pain from recent hysterectomy        The chart was scribed for me under my direct supervision.  I personally performed the history, physical, and medical decision making and all procedures in the evaluation of this patient.Maudry Diego, MD 02/25/13 2255

## 2013-02-25 NOTE — ED Notes (Signed)
Pt had hysterectomy WED last week and tonight started having severe vaginal pain. Pt denies any drainage or bleeding.

## 2013-02-25 NOTE — Discharge Instructions (Signed)
Call dr. Johnnye Sima office for follow up this week.

## 2013-02-26 ENCOUNTER — Ambulatory Visit (INDEPENDENT_AMBULATORY_CARE_PROVIDER_SITE_OTHER): Payer: BC Managed Care – PPO | Admitting: Obstetrics and Gynecology

## 2013-02-26 ENCOUNTER — Encounter: Payer: Self-pay | Admitting: Obstetrics and Gynecology

## 2013-02-26 VITALS — BP 120/72 | Wt 191.0 lb

## 2013-02-26 DIAGNOSIS — Z9889 Other specified postprocedural states: Secondary | ICD-10-CM

## 2013-02-26 MED ORDER — HYDROCHLOROTHIAZIDE 25 MG PO TABS: 25.0000 mg | ORAL_TABLET | Freq: Every day | ORAL | Status: DC

## 2013-02-26 MED ORDER — OXYCODONE-ACETAMINOPHEN 5-325 MG PO TABS: 1.0000 | ORAL_TABLET | ORAL | Status: DC | PRN

## 2013-02-26 NOTE — Progress Notes (Signed)
Patient ID: [name], female   DOB: Sep 23, 1985, 28 y.o.   MRN: [id number]    Subjective:  [name] is a 28 y.o. female who presents to the clinic 1 weeks status post supracervical hysterectomy and excision of old cicatrix due to prior abd wall endometriosis.   See in ED yest PM, no sign of fever. Notes pain at end of voiding effort. Review of Systems Negative except sharp suprapubic pain. Not using motrin. Needs refil on HC5/325's  She has been eating a regular diet without difficulty.   Bowel movements are normal. Pain is not well controlled.  Medications being used: narcotic analgesics including hydrocodone/acetaminophen (Lorcet, Lortab, Norco, Vicodin).  Objective:  BP 120/72  Wt 191 lb (86.637 kg)  LMP 01/08/2013 General:Well developed, well nourished.  No acute distress. Abdomen: Bowel sounds normal, soft, non-tender. Incision(s):   Healing well, no drainage, no erythema, no hernia, no swelling, no dehiscence, incision well approximated.slight puffiness as expected above incision   Assessment:  Post-Op 1 weeks s/p supracervical hysterectomy   and excisison of old scar  Hx scar endometriosis Doing well postoperatively.   Plan:  1.Wound care discussed  steristrips removed 2. .Continue any current medications. 3. Activity restrictions: no lifting more than 15 pounds 4. return to work: not applicable. 5. Follow up in 4 week. refil HC/5's, and add hctz, and motrin 400 q 6.

## 2013-02-26 NOTE — Patient Instructions (Signed)
Add fluid pill and motrin to your current meds.

## 2013-03-27 ENCOUNTER — Encounter: Payer: Self-pay | Admitting: Obstetrics and Gynecology

## 2013-03-27 ENCOUNTER — Encounter (INDEPENDENT_AMBULATORY_CARE_PROVIDER_SITE_OTHER): Payer: Self-pay

## 2013-03-27 ENCOUNTER — Ambulatory Visit (INDEPENDENT_AMBULATORY_CARE_PROVIDER_SITE_OTHER): Payer: Self-pay | Admitting: Obstetrics and Gynecology

## 2013-03-27 VITALS — BP 120/72 | Ht 62.0 in | Wt 191.0 lb

## 2013-03-27 DIAGNOSIS — Z9889 Other specified postprocedural states: Secondary | ICD-10-CM

## 2013-03-27 DIAGNOSIS — Z90710 Acquired absence of both cervix and uterus: Secondary | ICD-10-CM

## 2013-03-27 NOTE — Progress Notes (Signed)
This chart was scribed by Jenne Campus, Medical Scribe, for Dr. Mallory Shirk on 03/27/13 at 10:52 AM. This chart was reviewed by Dr. Mallory Shirk and is accurate.     Subjective:  [name] is a 28 y.o. female who presents to the clinic 5 weeks status post supracervical hysterectomy and excision of old cicatrix due to prior abd wall endometriosis. No sexual activity. + mild "deep" abdominal pain.  Review of Systems Negative except mild abdominal pain  She has been eating a regular diet without difficulty.   Bowel movements are normal. Pain is controlled without any medications.  Objective:  BP 120/72  Ht 5\' 2"  (1.575 m)  Wt 191 lb (86.637 kg)  BMI 34.93 kg/m2  LMP 01/08/2013 General:Well developed, well nourished.  No acute distress. Abdomen: Bowel sounds normal, soft, non-tender. Pelvic Exam:    External Genitalia:  Normal.    Vagina: Normal    Cervix: Normal, good support    Uterus: absent    Adnexa: absent Incision(s):   Healing well, no drainage, no erythema, no hernia, no swelling, no dehiscence, incision well approximated.   Assessment:  Post-Op 5 weeks s/p supracervical hysterectomy  and excisison of old scar Hx scar endometriosis Doing well postoperatively.   Plan:  1.Wound care discussed. 2. .Continue any current medications. 3. Activity restrictions: none 4. return to work: not applicable. 5. Follow up in PRN, pap q 3 yr

## 2013-04-19 ENCOUNTER — Encounter: Payer: Self-pay | Admitting: Obstetrics and Gynecology

## 2013-04-19 ENCOUNTER — Ambulatory Visit (INDEPENDENT_AMBULATORY_CARE_PROVIDER_SITE_OTHER): Payer: Self-pay | Admitting: Obstetrics and Gynecology

## 2013-04-19 VITALS — BP 120/82 | Ht 62.0 in | Wt 193.0 lb

## 2013-04-19 DIAGNOSIS — Z9889 Other specified postprocedural states: Secondary | ICD-10-CM

## 2013-04-19 DIAGNOSIS — F4389 Other reactions to severe stress: Secondary | ICD-10-CM

## 2013-04-19 DIAGNOSIS — F4320 Adjustment disorder, unspecified: Secondary | ICD-10-CM

## 2013-04-19 DIAGNOSIS — F438 Other reactions to severe stress: Secondary | ICD-10-CM

## 2013-04-19 MED ORDER — ESCITALOPRAM OXALATE 10 MG PO TABS: 10.0000 mg | ORAL_TABLET | Freq: Every day | ORAL | Status: DC

## 2013-04-19 NOTE — Patient Instructions (Signed)
Escitalopram oral solution What is this medicine? ESCITALOPRAM (es sye TAL oh pram) is used to treat depression and certain types of anxiety. This medicine may be used for other purposes; ask your health care provider or pharmacist if you have questions. COMMON BRAND NAME(S): Lexapro What should I tell my health care provider before I take this medicine? They need to know if you have any of these conditions: -bipolar disorder or a family history of bipolar disorder -diabetes -glaucoma -heart disease -kidney or liver disease -receiving electroconvulsive therapy -seizures (convulsions) -suicidal thoughts, plans, or attempt by you or a family member -an unusual or allergic reaction to escitalopram, citalopram, other medicines, foods, dyes, or preservatives -pregnant or trying to become pregnant -breast-feeding How should I use this medicine? Take this medicine by mouth. Follow the directions on the prescription label. Use a specially marked spoon or container to measure your medicine. Ask your pharmacist if you do not have one. Household spoons are not accurate. This medicine can be taken with or without food. Take your medicine at regular intervals. Do not take it more often than directed. Do not stop taking this medicine suddenly except upon the advice of your doctor. Stopping this medicine too quickly may cause serious side effects or your condition may worsen. A special MedGuide will be given to you by the pharmacist with each prescription and refill. Be sure to read this information carefully each time. Talk to your pediatrician regarding the use of this medicine in children. Special care may be needed. Overdosage: If you think you have taken too much of this medicine contact a poison control center or emergency room at once. NOTE: This medicine is only for you. Do not share this medicine with others. What if I miss a dose? If you miss a dose, take it as soon as you can. If it is almost  time for your next dose, take only that dose. Do not take double or extra doses. What may interact with this medicine? Do not take this medicine with any of the following medications: -certain medicines for fungal infections like fluconazole, itraconazole, ketoconazole, posaconazole, voriconazole -cisapride -citalopram -dofetilide -dronedarone -linezolid -MAOIs like Carbex, Eldepryl, Marplan, Nardil, and Parnate -methylene blue (injected into a vein) -pimozide -thioridazine -ziprasidone  This medicine may also interact with the following medications: -alcohol -aspirin and aspirin-like medicines -carbamazepine -certain medicines for depression, anxiety, or psychotic disturbances -certain medicines for migraine headache like almotriptan, eletriptan, frovatriptan, naratriptan, rizatriptan, sumatriptan, zolmitriptan -certain medicines for sleep -certain medicines that treat or prevent blood clots like warfarin, enoxaparin, and dalteparin -cimetidine -diuretics -fentanyl -furazolidone -isoniazid -lithium -metoprolol -NSAIDs, medicines for pain and inflammation, like ibuprofen or naproxen -other medicines that prolong the QT interval (cause an abnormal heart rhythm) -procarbazine -rasagiline -supplements like [place]. Carn[place] Casamento's wort, kava kava, valerian -tramadol -tryptophan This list may not describe all possible interactions. Give your health care provider a list of all the medicines, herbs, non-prescription drugs, or dietary supplements you use. Also tell them if you smoke, drink alcohol, or use illegal drugs. Some items may interact with your medicine. What should I watch for while using this medicine? Tell your doctor if your symptoms do not get better or if they get worse. Visit your doctor or health care professional for regular checks on your progress. Because it may take several weeks to see the full effects of this medicine, it is important to continue your treatment as prescribed  by your doctor. Patients and their families should watch out for new  or worsening thoughts of suicide or depression. Also watch out for sudden changes in feelings such as feeling anxious, agitated, panicky, irritable, hostile, aggressive, impulsive, severely restless, overly excited and hyperactive, or not being able to sleep. If this happens, especially at the beginning of treatment or after a change in dose, call your health care professional. Dennis Bast may get drowsy or dizzy. Do not drive, use machinery, or do anything that needs mental alertness until you know how this medicine affects you. Do not stand or sit up quickly, especially if you are an older patient. This reduces the risk of dizzy or fainting spells. Alcohol may interfere with the effect of this medicine. Avoid alcoholic drinks. Your mouth may get dry. Chewing sugarless gum or sucking hard candy, and drinking plenty of water may help. Contact your doctor if the problem does not go away or is severe. What side effects may I notice from receiving this medicine? Side effects that you should report to your doctor or health care professional as soon as possible: -allergic reactions like skin rash, itching or hives, swelling of the face, lips, or tongue -confusion -fast talking and excited feelings or actions that are out of control -feeling faint or lightheaded, falls -hallucination, loss of contact with reality -seizures -suicidal thoughts or other mood changes -unusual bleeding or bruising Side effects that usually do not require medical attention (report to your doctor or health care professional if they continue or are bothersome): -blurred vision -changes in appetite -change in sex drive or performance -headache -increased sweating -nausea This list may not describe all possible side effects. Call your doctor for medical advice about side effects. You may report side effects to FDA at 1-800-FDA-1088. Where should I keep my  medicine? Keep out of reach of children. Store at room temperature between 15 and 30 degrees C (59 and 86 degrees F). Throw away any unused medicine after the expiration date. NOTE: This sheet is a summary. It may not cover all possible information. If you have questions about this medicine, talk to your doctor, pharmacist, or health care provider.  2014, [employer]vier/[employer] Standard. (2012-08-07 12:35:18)

## 2013-04-19 NOTE — Progress Notes (Signed)
This chart was scribed by [name], Medical Scribe, for Dr. Mallory Shirk on 04/19/13 at 9:00 AM. This chart was reviewed by Dr. Mallory Shirk and is accurate.  Patient ID: [name], female   DOB: 1985/11/13, 28 y.o.   MRN: [id number]   [facility] Visit  Patient name: Genecis Veley MRN [id number]  Date of birth: 11/26/1985  CC & HPI:  Ladaysha Soutar is a 28 y.o. female presenting today for discussion of hormones/ mood .  NO changes in lifestyle. Affected more by events. Easily tearful. No libido changes. She reports sleeping fewer hours but states it is restful.    ROS:  Negative except noted above.   Pertinent History Reviewed:  Medical & Surgical Hx:  Reviewed: Significant for   Past Medical History  Diagnosis Date  . Hypoglycemia     Past Surgical History  Procedure Laterality Date  . Cesarean section  2009, 2012    x2  . Tonsillectomy    . Tubal ligation    . Mass excision N/A 07/23/2012    Procedure: EXCISION OF RIGHT LOWER ABDOMINAL MASS, ENDOMETRIAL IMPLANT;  Surgeon: Scherry Ran, MD;  Location: [facility];  Service: General;  Laterality: N/A;  . Supracervical abdominal hysterectomy N/A 02/19/2013    Procedure: HYSTERECTOMY SUPRACERVICAL ABDOMINAL;  Surgeon: [name], MD;  Location: [facility];  Service: Gynecology;  Laterality: N/A;  . Scar revision N/A 02/19/2013    Procedure: EXCISION OF SCAR ENDOMETRIOSIS;  Surgeon: [name], MD;  Location: [facility];  Service: Gynecology;  Laterality: N/A;  . Lysis of adhesion N/A 02/19/2013    Procedure: LYSIS OF ADHESION;  Surgeon: [name], MD;  Location: [facility];  Service: Gynecology;  Laterality: N/A;    Medications: Reviewed & Updated - see associated section Social History: Reviewed -  reports that she has never smoked. She has never used smokeless tobacco.  Objective Findings:  Vitals: BP 120/82  Ht 5\' 2"  (1.575 m)  Wt 193 lb (87.544 kg)  BMI 35.29 kg/m2  LMP 01/08/2013  Physical Examination:  General appearance - alert, well appearing, and in no distress and oriented to person, place, and time Mental status - alert, oriented to person, place, and time, depressed mood, no SI, HI Pelvic - examination not indicated   Assessment & Plan:   A: 1. Post op mood changes  P: 1. Lexapro 10 mg daily

## 2013-04-20 ENCOUNTER — Other Ambulatory Visit: Payer: Self-pay | Admitting: Obstetrics and Gynecology

## 2013-08-20 ENCOUNTER — Telehealth: Payer: Self-pay | Admitting: Obstetrics and Gynecology

## 2013-08-20 NOTE — Telephone Encounter (Signed)
Pt states that she had been taking Lexapro 10mg  for about 5 months and has taken herself off of the medication. Pt wants to know how long the side effects should last. The pt states that she is having some dizziness, and nausea and wanted to know about how long this should last.

## 2013-08-21 NOTE — Telephone Encounter (Signed)
I spoke with JAG, and she advised that the symptoms usually last about 2 weeks. Pt was advised of this and that if it doesn't get any better to call her PCP or our office back.   Pt verbalized understanding.

## 2013-11-25 ENCOUNTER — Encounter: Payer: Self-pay | Admitting: Obstetrics and Gynecology

## 2014-03-10 ENCOUNTER — Other Ambulatory Visit: Payer: Self-pay | Admitting: Obstetrics and Gynecology

## 2014-03-11 ENCOUNTER — Encounter: Payer: Self-pay | Admitting: *Deleted

## 2014-03-14 ENCOUNTER — Emergency Department (HOSPITAL_COMMUNITY)
Admission: EM | Admit: 2014-03-14 | Discharge: 2014-03-14 | Disposition: A | Discharge status: Home / Self Care | Payer: 59 | Attending: Emergency Medicine | Admitting: Emergency Medicine

## 2014-03-14 ENCOUNTER — Encounter (HOSPITAL_COMMUNITY): Payer: Self-pay | Admitting: *Deleted

## 2014-03-14 DIAGNOSIS — N898 Other specified noninflammatory disorders of vagina: Secondary | ICD-10-CM | POA: Insufficient documentation

## 2014-03-14 DIAGNOSIS — Z79899 Other long term (current) drug therapy: Secondary | ICD-10-CM | POA: Insufficient documentation

## 2014-03-14 LAB — WET PREP, GENITAL
CLUE CELLS WET PREP: NONE SEEN
Trich, Wet Prep: NONE SEEN

## 2014-03-14 MED ORDER — PROMETHAZINE HCL 12.5 MG PO TABS
12.5000 mg | ORAL_TABLET | Freq: Once | ORAL | Status: AC
  Administered 2014-03-14: 12.5 mg via ORAL
  Filled 2014-03-14: qty 1

## 2014-03-14 MED ORDER — HYDROCODONE-ACETAMINOPHEN 5-325 MG PO TABS
2.0000 | ORAL_TABLET | Freq: Once | ORAL | Status: AC
  Administered 2014-03-14: 2 via ORAL
  Filled 2014-03-14: qty 2

## 2014-03-14 MED ORDER — HYDROCODONE-ACETAMINOPHEN 5-325 MG PO TABS: ORAL_TABLET | ORAL | Status: DC

## 2014-03-14 NOTE — ED Provider Notes (Signed)
CSN: [id number]     Arrival date & time 03/14/14  1958 History   First MD Initiated Contact with Patient 03/14/14 2042     Chief Complaint  Patient presents with  . Vaginal Discharge     (Consider location/radiation/quality/duration/timing/severity/associated sxs/prior Treatment) Patient is a 29 y.o. female presenting with vaginal discharge. The history is provided by the patient and the spouse.  Vaginal Discharge Quality:  Mucoid Severity:  Moderate Onset quality:  Gradual Duration:  2 days Timing:  Intermittent Progression:  Worsening Chronicity:  New Context: spontaneously   Context: not genital trauma and not recent antibiotic use   Relieved by:  Nothing Ineffective treatments:  OTC medications Associated symptoms: vaginal itching   Associated symptoms: no abdominal pain and no dysuria   Risk factors: no immunosuppression and no new sexual partner     Past Medical History  Diagnosis Date  . Hypoglycemia    Past Surgical History  Procedure Laterality Date  . Cesarean section  2009, 2012    x2  . Tonsillectomy    . Tubal ligation    . Mass excision N/A 07/23/2012    Procedure: EXCISION OF RIGHT LOWER ABDOMINAL MASS, ENDOMETRIAL IMPLANT;  Surgeon: Scherry Ran, MD;  Location: [facility];  Service: General;  Laterality: N/A;  . Supracervical abdominal hysterectomy N/A 02/19/2013    Procedure: HYSTERECTOMY SUPRACERVICAL ABDOMINAL;  Surgeon: [name], MD;  Location: [facility];  Service: Gynecology;  Laterality: N/A;  . Scar revision N/A 02/19/2013    Procedure: EXCISION OF SCAR ENDOMETRIOSIS;  Surgeon: [name], MD;  Location: [facility];  Service: Gynecology;  Laterality: N/A;  . Lysis of adhesion N/A 02/19/2013    Procedure: LYSIS OF ADHESION;  Surgeon: [name], MD;  Location: [facility];  Service: Gynecology;  Laterality: N/A;  . Abdominal hysterectomy     Family History  Problem Relation Age of Onset  . Hypertension Father   . Diabetes Other   . Heart  disease Other   . Colon cancer Other   . Heart disease Other    History  Substance Use Topics  . Smoking status: Never Smoker   . Smokeless tobacco: Never Used  . Alcohol Use: No   OB History    Gravida Para Term Preterm AB TAB SAB Ectopic Multiple Living   2 2             Review of Systems  Constitutional: Negative for activity change.       All ROS Neg except as noted in HPI  Eyes: Negative for photophobia and discharge.  Respiratory: Negative for cough, shortness of breath and wheezing.   Cardiovascular: Negative for chest pain and palpitations.  Gastrointestinal: Negative for abdominal pain and blood in stool.  Genitourinary: Positive for vaginal discharge. Negative for dysuria, frequency and hematuria.  Musculoskeletal: Negative for back pain, arthralgias and neck pain.  Skin: Negative.   Neurological: Negative for dizziness, seizures and speech difficulty.  Psychiatric/Behavioral: Negative for hallucinations and confusion.      Allergies  Codeine  Home Medications   Prior to Admission medications   Medication Sig Start Date End Date Taking? Authorizing Provider  hydrochlorothiazide (HYDRODIURIL) 25 MG tablet TAKE 1 TABLET BY MOUTH EVERY DAY Patient not taking: Reported on 03/14/2014 04/20/13   [name], MD   BP 139/82 mmHg  Pulse 84  Temp(Src) 99.1 F (37.3 C) (Oral)  Resp 16  Ht 5\' 1"  (1.549 m)  Wt 205 lb (92.987 kg)  BMI  38.75 kg/m2  SpO2 98%  LMP 01/08/2013 Physical Exam  Constitutional: She is oriented to person, place, and time. She appears well-developed and well-nourished.  Non-toxic appearance.  HENT:  Head: Normocephalic.  Right Ear: Tympanic membrane and external ear normal.  Left Ear: Tympanic membrane and external ear normal.  Eyes: EOM and lids are normal. Pupils are equal, round, and reactive to light.  Neck: Normal range of motion. Neck supple. Carotid bruit is not present.  Cardiovascular: Normal rate, regular rhythm, normal heart  sounds, intact distal pulses and normal pulses.   Pulmonary/Chest: Breath sounds normal. No respiratory distress.  Abdominal: Soft. Bowel sounds are normal. There is no tenderness. There is no guarding.  Genitourinary: Vaginal discharge found.  Chaperone present during exam.  Musculoskeletal: Normal range of motion.  Lymphadenopathy:       Head (right side): No submandibular adenopathy present.       Head (left side): No submandibular adenopathy present.    She has no cervical adenopathy.  Neurological: She is alert and oriented to person, place, and time. She has normal strength. No cranial nerve deficit or sensory deficit.  Skin: Skin is warm and dry.  Psychiatric: She has a normal mood and affect. Her speech is normal.  Nursing note and vitals reviewed.   ED Course  Vital signs are well within normal limits. Examination reveals some denuded areas of the internal and external labia. No blisters or rash appreciated. Wet prep suggest a rare yeast and rare white blood cells, otherwise completely normal.  Patient relates that she has used the KY intense chills recently. I have advised the patient to use just plain KY, or plain Vaseline of 2 times daily until the discomfort has improved. Patient is given a prescription for Norco for severe discomfort. Patient is to see the gynecology physician if he discharge continues, or if the pain worsens. Patient acknowledges understanding and is in agreement with this discharge plan.   Procedures (including critical care time) Labs Review Labs Reviewed - No data to display  Imaging Review No results found.   EKG Interpretation None      MDM  Wet prep is negative for severe yeast infection, Trichomonas infection, or bacterial vaginosis. Vital signs are within normal limits. No rash to suggest herpes, or herpetic lesions.    Final diagnoses:  None    **I have reviewed nursing notes, vital signs, and all appropriate lab and imaging results  for this patient.Lenox Ahr, PA-C 03/15/14 2156  Maudry Diego, MD 03/16/14 [phone]

## 2014-03-14 NOTE — ED Notes (Signed)
Patient with no complaints at this time. Respirations even and unlabored. Skin warm/dry. Discharge instructions reviewed with patient at this time. Patient given opportunity to voice concerns/ask questions. Patient discharged at this time and left Emergency Department with steady gait.   

## 2014-03-14 NOTE — Discharge Instructions (Signed)
Your wet prep test is negative for acute changes or problems. Please apply plain KY jelly, or Vaseline 2 times daily until the irritation clears. Please see your GYN physician if discharge does not improve, or gets worse. Please use Tylenol or ibuprofen for mild discomfort. May use Norco at bedtime if needed for more severe discomfort. Norco may cause drowsiness, please use with caution.

## 2014-03-14 NOTE — ED Notes (Signed)
Vaginal d/c for 2 days, using otc creme for sx, says vulva area is irritated and red.

## 2014-03-17 LAB — GC/CHLAMYDIA PROBE AMP (~~LOC~~) NOT AT ARMC
Chlamydia: NEGATIVE
Neisseria Gonorrhea: NEGATIVE

## 2014-03-18 ENCOUNTER — Encounter: Payer: Self-pay | Admitting: Adult Health

## 2014-03-18 ENCOUNTER — Ambulatory Visit (INDEPENDENT_AMBULATORY_CARE_PROVIDER_SITE_OTHER): Payer: 59 | Admitting: Adult Health

## 2014-03-18 VITALS — BP 120/80 | HR 62 | Ht 61.0 in | Wt 200.0 lb

## 2014-03-18 DIAGNOSIS — N898 Other specified noninflammatory disorders of vagina: Secondary | ICD-10-CM | POA: Diagnosis not present

## 2014-03-18 DIAGNOSIS — A499 Bacterial infection, unspecified: Secondary | ICD-10-CM

## 2014-03-18 DIAGNOSIS — N760 Acute vaginitis: Secondary | ICD-10-CM | POA: Diagnosis not present

## 2014-03-18 DIAGNOSIS — L298 Other pruritus: Secondary | ICD-10-CM

## 2014-03-18 DIAGNOSIS — B9689 Other specified bacterial agents as the cause of diseases classified elsewhere: Secondary | ICD-10-CM

## 2014-03-18 HISTORY — DX: Other specified noninflammatory disorders of vagina: N89.8

## 2014-03-18 HISTORY — DX: Other specified bacterial agents as the cause of diseases classified elsewhere: B96.89

## 2014-03-18 LAB — POCT WET PREP (WET MOUNT): WBC WET PREP: POSITIVE

## 2014-03-18 LAB — POCT URINALYSIS DIPSTICK
Blood, UA: NEGATIVE
Glucose, UA: NEGATIVE
Leukocytes, UA: NEGATIVE
Nitrite, UA: NEGATIVE
PROTEIN UA: NEGATIVE

## 2014-03-18 MED ORDER — NYSTATIN-TRIAMCINOLONE 100000-0.1 UNIT/GM-% EX CREA: 1.0000 "application " | TOPICAL_CREAM | Freq: Two times a day (BID) | CUTANEOUS | Status: DC

## 2014-03-18 MED ORDER — METRONIDAZOLE 500 MG PO TABS: 500.0000 mg | ORAL_TABLET | Freq: Two times a day (BID) | ORAL | Status: DC

## 2014-03-18 NOTE — Progress Notes (Addendum)
Subjective:     Patient ID: [name], female   DOB: 08-07-1985, 29 y.o.   MRN: [id number]  HPI Rupinder is a 29 year old white female, married in from ER visit 2/19 at [facility] for vaginal discharge and pain.Was treated with norco only.Says it feels like pins sticking her, and only relief is sitting in water.Says breast tender no masses.She is sp supra cervical hysterectomy   Review of Systems  See HPI for positives, Reviewed past medical,surgical, social and family history. Reviewed medications and allergies.     Objective:   Physical Exam BP 120/80 mmHg  Pulse 62  Ht 5\' 1"  (1.549 m)  Wt 200 lb (90.719 kg)  BMI 37.81 kg/m2  LMP 12/16/2014Urine negative, Skin warm and dry.Pelvic: external genitalia is normal in appearance no lesions, vagina: white discharge with odor, has red side walls,urethra has no lesions or masses noted, cervix:smooth and bulbous, uterus: absent, adnexa: no masses or tenderness noted. Bladder is tender and no masses felt. Wet prep: + for clue cells and +WBCs.     Assessment:     Vaginal discharge Vaginal itch BV    Plan:     Rx flagyl 500 mg 1 bid x 7 days, #14 no refills,no alcohol, review handout on BV   Rx mytrex cream use 2-3 x daily prn to affected area Follow up in 1 week, if breasts still tender will do exam then  Decrease caffeine Wear loose panties

## 2014-03-18 NOTE — Patient Instructions (Signed)
Bacterial Vaginosis Bacterial vaginosis is a vaginal infection that occurs when the normal balance of bacteria in the vagina is disrupted. It results from an overgrowth of certain bacteria. This is the most common vaginal infection in women of childbearing age. Treatment is important to prevent complications, especially in pregnant women, as it can cause a premature delivery. CAUSES  Bacterial vaginosis is caused by an increase in harmful bacteria that are normally present in smaller amounts in the vagina. Several different kinds of bacteria can cause bacterial vaginosis. However, the reason that the condition develops is not fully understood. RISK FACTORS Certain activities or behaviors can put you at an increased risk of developing bacterial vaginosis, including:  Having a new sex partner or multiple sex partners.  Douching.  Using an intrauterine device (IUD) for contraception. Women do not get bacterial vaginosis from toilet seats, bedding, swimming pools, or contact with objects around them. SIGNS AND SYMPTOMS  Some women with bacterial vaginosis have no signs or symptoms. Common symptoms include:  Grey vaginal discharge.  A fishlike odor with discharge, especially after sexual intercourse.  Itching or burning of the vagina and vulva.  Burning or pain with urination. DIAGNOSIS  Your health care provider will take a medical history and examine the vagina for signs of bacterial vaginosis. A sample of vaginal fluid may be taken. Your health care provider will look at this sample under a microscope to check for bacteria and abnormal cells. A vaginal pH test may also be done.  TREATMENT  Bacterial vaginosis may be treated with antibiotic medicines. These may be given in the form of a pill or a vaginal cream. A second round of antibiotics may be prescribed if the condition comes back after treatment.  HOME CARE INSTRUCTIONS   Only take over-the-counter or prescription medicines as  directed by your health care provider.  If antibiotic medicine was prescribed, take it as directed. Make sure you finish it even if you start to feel better.  Do not have sex until treatment is completed.  Tell all sexual partners that you have a vaginal infection. They should see their health care provider and be treated if they have problems, such as a mild rash or itching.  Practice safe sex by using condoms and only having one sex partner. SEEK MEDICAL CARE IF:   Your symptoms are not improving after 3 days of treatment.  You have increased discharge or pain.  You have a fever. MAKE SURE YOU:   Understand these instructions.  Will watch your condition.  Will get help right away if you are not doing well or get worse. FOR MORE INFORMATION  Centers for Disease Control and Prevention, Division of STD Prevention: AppraiserFraud.fi American Sexual Health Association (ASHA): www.ashastd.org  Document Released: 01/10/2005 Document Revised: 10/31/2012 Document Reviewed: 08/22/2012 [facility] Patient Information 2015 [place], [place]. This information is not intended to replace advice given to you by your health care provider. Make sure you discuss any questions you have with your health care provider. Use mytrex 2-3 x daily Take flagyl Follow up in 1 week

## 2014-03-25 ENCOUNTER — Ambulatory Visit (INDEPENDENT_AMBULATORY_CARE_PROVIDER_SITE_OTHER): Payer: 59 | Admitting: Adult Health

## 2014-03-25 ENCOUNTER — Encounter: Payer: Self-pay | Admitting: Adult Health

## 2014-03-25 VITALS — BP 120/72 | HR 76 | Ht 61.0 in | Wt 201.5 lb

## 2014-03-25 DIAGNOSIS — L298 Other pruritus: Secondary | ICD-10-CM | POA: Diagnosis not present

## 2014-03-25 DIAGNOSIS — L905 Scar conditions and fibrosis of skin: Secondary | ICD-10-CM

## 2014-03-25 DIAGNOSIS — N63 Unspecified lump in unspecified breast: Secondary | ICD-10-CM | POA: Insufficient documentation

## 2014-03-25 DIAGNOSIS — N644 Mastodynia: Secondary | ICD-10-CM

## 2014-03-25 DIAGNOSIS — R52 Pain, unspecified: Secondary | ICD-10-CM

## 2014-03-25 DIAGNOSIS — N898 Other specified noninflammatory disorders of vagina: Secondary | ICD-10-CM

## 2014-03-25 HISTORY — DX: Unspecified lump in unspecified breast: N63.0

## 2014-03-25 MED ORDER — NAPROXEN SODIUM 550 MG PO TABS: 550.0000 mg | ORAL_TABLET | Freq: Two times a day (BID) | ORAL | Status: DC

## 2014-03-25 NOTE — Progress Notes (Signed)
Subjective:     Patient ID: [name], female   DOB: [birth year], 29 y.o.   MRN: [id number]  HPI [name] is a 29 year old white female back in follow up of vaginal itching, was treated for BV and is finally feeling better, breasts still tender even after decreasing caffeine.Has seen PCP and was given wellbutrin but has not started it yet.  Review of Systems  +vaginal itch,+breast tenderness all other systems negative Reviewed past medical,surgical, social and family history. Reviewed medications and allergies.     Objective:   Physical Exam BP 120/72 mmHg  Pulse 76  Ht 5\' 1"  (1.549 m)  Wt 201 lb 8 oz (91.4 kg)  BMI 38.09 kg/m2  LMP 01/08/2013 Skin warm and dry.Pelvic: external genitalia is normal in appearance no lesions, vagina: scant discharge without odor,urethra has no lesions or masses noted, cervix:smooth and bulbous, uterus: absent adnexa: no masses or tenderness noted. Bladder is non tender and no masses felt. Tender over scar area near bladder.   Breasts:no dominate palpable mass, retraction or nipple discharge on left, but tender UOQ, on right there is no retraction or nipple discharge, has 1 cm nodule at 11 0'clock 3 FB from areola that is tender, has regular irregularities.   She has history of endometriosis at scar and has had hysterectomy for that.Will try anaprox ds to see if helps.  Assessment:     Right breast nodule Bilateral breast tenderness Vaginal itch Pain at scar, history of endometriosis    Plan:    Rx anaprox ds #40 take 1 bid with 1 refill Right breast US 3/8 at 9:30 am at [facility] Return 3/16 to see Dr Glo Herring for pain at scar and re evaluate for recurrence of endometriosis    Review handout on breast cysts

## 2014-03-25 NOTE — Patient Instructions (Signed)
Breast Cyst A breast cyst is a sac in the breast that is filled with fluid. Breast cysts are common in women. Women can have one or many cysts. When the breasts contain many cysts, it is usually due to a noncancerous (benign) condition called fibrocystic change. These lumps form under the influence of female hormones (estrogen and progesterone). The lumps are most often located in the upper, outer portion of the breast. They are often more swollen, painful, and tender before your period starts. They usually disappear after menopause, unless you are on hormone therapy.  There are several types of cysts:  Macrocyst. This is a cyst that is about 2 in. (5.1 cm) in diameter.   Microcyst. This is a tiny cyst that you cannot feel but can be seen with a mammogram or an ultrasound.   Galactocele. This is a cyst containing milk that may develop if you suddenly stop breastfeeding.   Sebaceous cyst of the skin. This type of cyst is not in the breast tissue itself. Breast cysts do not increase your risk of breast cancer. However, they must be monitored closely because they can be cancerous.  CAUSES  It is not known exactly what causes a breast cyst to form. Possible causes include:  An overgrowth of milk glands and connective tissue in the breast can block the milk glands, causing them to fill with fluid.   Scar tissue in the breast from previous surgery may block the glands, causing a cyst.  RISK FACTORS Estrogen may influence the development of a breast cyst.  SIGNS AND SYMPTOMS   Feeling a smooth, round, soft lump (like a grape) in the breast that is easily moveable.   Breast discomfort or pain.  Increase in size of the lump before your menstrual period and decrease in its size after your menstrual period.  DIAGNOSIS  A cyst can be felt during a physical exam by your health care provider. A breast X-ray exam (mammogram) and ultrasonography will be done to confirm the diagnosis. Fluid may  be removed from the cyst with a needle (fine needle aspiration) to make sure the cyst is not cancerous.  TREATMENT  Treatment may not be necessary. Your health care provider may monitor the cyst to see if it goes away on its own. If treatment is needed, it may include:  Hormone treatment.   Needle aspiration. There is a chance of the cyst coming back after aspiration.   Surgery to remove the whole cyst.  HOME CARE INSTRUCTIONS   Keep all follow-up appointments with your health care provider.  See your health care provider regularly:  Get a yearly exam by your health care provider.  Have a clinical breast exam by a health care provider every 1-3 years if you are 20-40 years of age. After age 40 years, you should have the exam every year.   Get mammogram tests as directed by your health care provider.   Understand the normal appearance and feel of your breasts and perform breast self-exams.   Only take over-the-counter or prescription medicines as directed by your health care provider.   Wear a supportive bra, especially when exercising.   Avoid caffeine.   Reduce your salt intake, especially before your menstrual period. Too much salt can cause fluid retention, breast swelling, and discomfort.  SEEK MEDICAL CARE IF:   You feel, or think you feel, a lump in your breast.   You notice that both breasts look or feel different than usual.   Your   breast is still causing pain after your menstrual period is over.   You need medicine for breast pain and swelling that occurs with your menstrual period.  SEEK IMMEDIATE MEDICAL CARE IF:   You have severe pain, tenderness, redness, or warmth in your breast.   You have nipple discharge or bleeding.   Your breast lump becomes hard and painful.   You find new lumps or bumps that were not there before.   You feel lumps in your armpit (axilla).   You notice dimpling or wrinkling of the breast or nipple.   You  have a fever.  MAKE SURE YOU:  Understand these instructions.  Will watch your condition.  Will get help right away if you are not doing well or get worse. Document Released: 01/10/2005 Document Revised: 09/12/2012 Document Reviewed: 08/09/2012 [facility] Patient Information 2015 [place], [place]. This information is not intended to replace advice given to you by your health care provider. Make sure you discuss any questions you have with your health care provider. Breast US 3/8 at 9:30 am at [facility] Return  3/16 to see JVF Take anaprox 1 2 x daily

## 2014-04-01 ENCOUNTER — Ambulatory Visit (HOSPITAL_COMMUNITY)
Admission: RE | Admit: 2014-04-01 | Discharge: 2014-04-01 | Disposition: A | Discharge status: Home / Self Care | Payer: 59 | Source: Ambulatory Visit | Attending: Adult Health | Admitting: Adult Health

## 2014-04-01 DIAGNOSIS — N644 Mastodynia: Secondary | ICD-10-CM | POA: Diagnosis not present

## 2014-04-01 DIAGNOSIS — N63 Unspecified lump in unspecified breast: Secondary | ICD-10-CM

## 2014-04-09 ENCOUNTER — Encounter: Payer: Self-pay | Admitting: Obstetrics and Gynecology

## 2014-04-09 ENCOUNTER — Ambulatory Visit (INDEPENDENT_AMBULATORY_CARE_PROVIDER_SITE_OTHER): Payer: 59 | Admitting: Obstetrics and Gynecology

## 2014-04-09 VITALS — BP 120/78 | Ht 62.0 in | Wt 200.0 lb

## 2014-04-09 DIAGNOSIS — R109 Unspecified abdominal pain: Secondary | ICD-10-CM | POA: Diagnosis not present

## 2014-04-09 NOTE — Progress Notes (Signed)
Patient ID: [name], female   DOB: 1985/05/28, 29 y.o.   MRN: [id number] Pt here today for pain at scar and to go over another issue she is having per Anderson Malta. Pt states that the scar causes pain all the way across but more in the center and to the right.

## 2014-04-09 NOTE — Progress Notes (Addendum)
Patient ID: [name], female   DOB: 08-15-1985, 29 y.o.   MRN: [id number]  This chart was SCRIBED for Mallory Shirk, MD by Stephania Fragmin, ED Scribe. This patient was seen in room 2 and the patient's care was started at [facility] Visit  Patient name: [name] MRN [id number]  Date of birth: 07-06-1985  CC & HPI:  [name] is a 29 y.o. female with a history of endometriosis presenting today for constant, moderate, suprapubic abdominal pain around her surgical incision S/P supracervical abdominal hysterectomy, scar revision, and lysis of adhesion that occurred 02/19/2013. Patient has seen Derrek Monaco, NP, for this. She states that she felt she was healing normally for 6 months afterwards, but started having pain in the following months. Movement and palpation exacerbate her pain. She has taken Naproxen with minimal relief. She states she has daily, normal BMs with no associated pain. She has not had any imaging done. Patient denies any unusual weight changes.   ROS:  Cesarean section x 2, tubal ligation, supracervical abdominal hysterectomy, scar revision, and lysis of adhesion  Pertinent History Reviewed:   Reviewed: Significant for abdominal pain Medical         Past Medical History  Diagnosis Date  . Hypoglycemia   . Vaginal discharge 03/18/2014  . Vaginal itching 03/18/2014  . BV (bacterial vaginosis) 03/18/2014  . Breast nodule 03/25/2014                              Surgical Hx:    Past Surgical History  Procedure Laterality Date  . Cesarean section  2009, 2012    x2  . Tonsillectomy    . Tubal ligation    . Mass excision N/A 07/23/2012    Procedure: EXCISION OF RIGHT LOWER ABDOMINAL MASS, ENDOMETRIAL IMPLANT;  Surgeon: Scherry Ran, MD;  Location: [facility];  Service: General;  Laterality: N/A;  . Supracervical abdominal hysterectomy N/A 02/19/2013    Procedure: HYSTERECTOMY SUPRACERVICAL ABDOMINAL;  Surgeon: [name], MD;  Location: [facility];  Service: Gynecology;  Laterality: N/A;  . Scar revision N/A 02/19/2013    Procedure: EXCISION OF SCAR ENDOMETRIOSIS;  Surgeon: [name], MD;  Location: [facility];  Service: Gynecology;  Laterality: N/A;  . Lysis of adhesion N/A 02/19/2013    Procedure: LYSIS OF ADHESION;  Surgeon: [name], MD;  Location: [facility];  Service: Gynecology;  Laterality: N/A;  . Abdominal hysterectomy     Medications: Reviewed & Updated - see associated section                       Current outpatient prescriptions:  .  ACCU-CHEK AVIVA PLUS test strip, , Disp: , Rfl: 11 .  hydrochlorothiazide (HYDRODIURIL) 25 MG tablet, TAKE 1 TABLET BY MOUTH EVERY DAY, Disp: 30 tablet, Rfl: 1 .  ibuprofen (ADVIL,MOTRIN) 800 MG tablet, Take 800 mg by mouth as needed. , Disp: , Rfl: 0 .  naproxen sodium (ANAPROX) 550 MG tablet, Take 1 tablet (550 mg total) by mouth 2 (two) times daily with a meal., Disp: 40 tablet, Rfl: 1 .  buPROPion (WELLBUTRIN XL) 150 MG 24 hr tablet, Take 150 mg by mouth daily., Disp: , Rfl: 1   Social History: Reviewed -  reports that she has never smoked. She has never used smokeless tobacco.  Objective Findings:  Vitals: Blood pressure 120/78, height 5'  2" (1.575 m), weight 200 lb (90.719 kg), last menstrual period 01/08/2013.  Physical Examination: General appearance - alert, well appearing, and in no distress, oriented to person, place, and time and overweight Mental status - alert, oriented to person, place, and time, normal mood, behavior, speech, dress, motor activity, and thought processes, affect appropriate to mood Abdomen - soft, nondistended, no masses or organomegaly tenderness noted along the midportion of the abd scar and over to the pt right, no hernia mass or adheison s felt. scars from previous incisions perfectly healed transverse incision with no masses or hernia   Assessment & Plan:   A:  1. Abd Wall pain ?etiology 2. Hx severe scarring intraabdominal from last  cesarean, s/p supracervical hysterectomy  P:  1. CT abdomen with contrast 2. Revisit with pelvic exam      I personally performed the services described in this documentation, which was SCRIBED in my presence. The recorded information has been reviewed and considered accurate. It has been edited as necessary during review. [name], MD

## 2014-04-15 ENCOUNTER — Ambulatory Visit (HOSPITAL_COMMUNITY)
Admission: RE | Admit: 2014-04-15 | Discharge: 2014-04-15 | Disposition: A | Discharge status: Home / Self Care | Payer: 59 | Source: Ambulatory Visit | Attending: Obstetrics and Gynecology | Admitting: Obstetrics and Gynecology

## 2014-04-15 ENCOUNTER — Encounter (HOSPITAL_COMMUNITY): Payer: Self-pay

## 2014-04-15 DIAGNOSIS — R109 Unspecified abdominal pain: Secondary | ICD-10-CM | POA: Diagnosis not present

## 2014-04-15 MED ORDER — IOHEXOL 300 MG/ML  SOLN
100.0000 mL | Freq: Once | INTRAMUSCULAR | Status: AC | PRN
  Administered 2014-04-15: 100 mL via INTRAVENOUS

## 2014-04-28 ENCOUNTER — Encounter: Payer: Self-pay | Admitting: Obstetrics and Gynecology

## 2014-04-28 ENCOUNTER — Ambulatory Visit (INDEPENDENT_AMBULATORY_CARE_PROVIDER_SITE_OTHER): Payer: 59 | Admitting: Obstetrics and Gynecology

## 2014-04-28 VITALS — BP 110/72 | Ht 62.0 in | Wt 200.0 lb

## 2014-04-28 DIAGNOSIS — L7682 Other postprocedural complications of skin and subcutaneous tissue: Secondary | ICD-10-CM

## 2014-04-28 DIAGNOSIS — R208 Other disturbances of skin sensation: Secondary | ICD-10-CM

## 2014-04-28 NOTE — Progress Notes (Signed)
Patient ID: [name], female   DOB: Sep 02, 1985, 29 y.o.   MRN: [id number] Pt here today for follow up. Pt denies any problems or concerns at this time.

## 2014-04-28 NOTE — Progress Notes (Signed)
Patient ID: [name], female   DOB: 1985/03/05, 29 y.o.   MRN: [id number]    [facility] Visit  Patient name: [name] MRN [id number]  Date of birth: 10-15-1985  CC & HPI:  [name] is a 29 y.o. female with a history of endometriosis, s/p supracervical partial hysterectomy, presenting today for central suprapubic pain that became worse in the last 4 months. Pt reports pain associated with the incision line from her hysterectomy 1 year ago. She had CT abdomen 2 weeks ago which was unremarkable.   ROS:  A complete 10 system review of systems was obtained and all systems are negative except as noted in the HPI and PMH.    Pertinent History Reviewed:   Reviewed: Significant for tubal ligation, abdominal hysterectomy Medical         Past Medical History  Diagnosis Date  . Hypoglycemia   . Vaginal discharge 03/18/2014  . Vaginal itching 03/18/2014  . BV (bacterial vaginosis) 03/18/2014  . Breast nodule 03/25/2014                              Surgical Hx:    Past Surgical History  Procedure Laterality Date  . Cesarean section  2009, 2012    x2  . Tonsillectomy    . Tubal ligation    . Mass excision N/A 07/23/2012    Procedure: EXCISION OF RIGHT LOWER ABDOMINAL MASS, ENDOMETRIAL IMPLANT;  Surgeon: Scherry Ran, MD;  Location: [facility];  Service: General;  Laterality: N/A;  . Supracervical abdominal hysterectomy N/A 02/19/2013    Procedure: HYSTERECTOMY SUPRACERVICAL ABDOMINAL;  Surgeon: [name], MD;  Location: [facility];  Service: Gynecology;  Laterality: N/A;  . Scar revision N/A 02/19/2013    Procedure: EXCISION OF SCAR ENDOMETRIOSIS;  Surgeon: [name], MD;  Location: [facility];  Service: Gynecology;  Laterality: N/A;  . Lysis of adhesion N/A 02/19/2013    Procedure: LYSIS OF ADHESION;  Surgeon: [name], MD;  Location: [facility];  Service: Gynecology;  Laterality: N/A;  . Abdominal hysterectomy     Medications: Reviewed & Updated - see associated  section                       Current outpatient prescriptions:  .  ACCU-CHEK AVIVA PLUS test strip, , Disp: , Rfl: 11 .  buPROPion (WELLBUTRIN XL) 150 MG 24 hr tablet, Take 150 mg by mouth daily., Disp: , Rfl: 1 .  hydrochlorothiazide (HYDRODIURIL) 25 MG tablet, TAKE 1 TABLET BY MOUTH EVERY DAY, Disp: 30 tablet, Rfl: 1 .  ibuprofen (ADVIL,MOTRIN) 800 MG tablet, Take 800 mg by mouth as needed. , Disp: , Rfl: 0 .  naproxen sodium (ANAPROX) 550 MG tablet, Take 1 tablet (550 mg total) by mouth 2 (two) times daily with a meal., Disp: 40 tablet, Rfl: 1   Social History: Reviewed -  reports that she has never smoked. She has never used smokeless tobacco.  Objective Findings:  Vitals: Blood pressure 110/72, height 5\' 2"  (1.575 m), weight 200 lb (90.719 kg), last menstrual period 01/08/2013.  Physical Examination: General appearance - alert, well appearing, and in no distress and oriented to person, place, and time Mental status - alert, oriented to person, place, and time, normal mood, behavior, speech, dress, motor activity, and thought processes Pelvic - normal external genitalia, vulva, vagina, cervix VULVA: normal appearing vulva with no masses, tenderness  or lesions VAGINA: normal appearing vagina with normal color and discharge, no lesions CERVIX: normal appearing cervix without discharge or lesions  UTERUS: surgically absent ADNEXA: surgically absent Abdomen-Tender to midline and RLQ  Assessment & Plan:   A:  1. Difficulty differentiating between suprapubic pain and pain with vaginal exam at contact of cervix 2. Discussion of treatment which includes OTC pain treatment, but no surgery at this time, pt advised to expect some chronic sensitivity  P:  1. Motrin and Aleve prn  2.      This chart was scribed for [name], MD by Tula Nakayama, ED Scribe. This patient was seen in room 2 and the patient's care was started at 10:38 AM.   I personally performed the services  described in this documentation, which was SCRIBED in my presence. The recorded information has been reviewed and considered accurate. It has been edited as necessary during review. [name], MD

## 2014-07-21 ENCOUNTER — Other Ambulatory Visit: Payer: Self-pay | Admitting: Occupational Medicine

## 2014-07-21 ENCOUNTER — Ambulatory Visit: Payer: Self-pay

## 2014-07-21 DIAGNOSIS — M25511 Pain in right shoulder: Secondary | ICD-10-CM

## 2014-08-18 ENCOUNTER — Ambulatory Visit: Payer: PRIVATE HEALTH INSURANCE | Attending: Orthopedic Surgery | Admitting: Physical Therapy

## 2014-08-18 DIAGNOSIS — M25511 Pain in right shoulder: Secondary | ICD-10-CM | POA: Insufficient documentation

## 2014-08-18 DIAGNOSIS — M2560 Stiffness of unspecified joint, not elsewhere classified: Secondary | ICD-10-CM | POA: Diagnosis present

## 2014-08-18 NOTE — Therapy (Signed)
[place], [place], [address] Phone: [phone]   Fax:  [phone]  Physical Therapy Evaluation  Patient Details  Name: [name] MRN: [id number] Date of Birth: [birth year] Referring Provider:  Frederik Pear, MD  Encounter Date: 08/18/2014      PT End of Session - 08/18/14 1015    Visit Number 1   Number of Visits 12   Date for PT Re-Evaluation 09/29/14   PT Start Time 0935   PT Stop Time 1020   PT Time Calculation (min) 45 min   Activity Tolerance Patient tolerated treatment well      Past Medical History  Diagnosis Date  . Hypoglycemia   . Vaginal discharge 03/18/2014  . Vaginal itching 03/18/2014  . BV (bacterial vaginosis) 03/18/2014  . Breast nodule 03/25/2014    Past Surgical History  Procedure Laterality Date  . Cesarean section  2009, 2012    x2  . Tonsillectomy    . Tubal ligation    . Mass excision N/A 07/23/2012    Procedure: EXCISION OF RIGHT LOWER ABDOMINAL MASS, ENDOMETRIAL IMPLANT;  Surgeon: Scherry Ran, MD;  Location: [facility];  Service: General;  Laterality: N/A;  . Supracervical abdominal hysterectomy N/A 02/19/2013    Procedure: HYSTERECTOMY SUPRACERVICAL ABDOMINAL;  Surgeon: [name], MD;  Location: [facility];  Service: Gynecology;  Laterality: N/A;  . Scar revision N/A 02/19/2013    Procedure: EXCISION OF SCAR ENDOMETRIOSIS;  Surgeon: [name], MD;  Location: [facility];  Service: Gynecology;  Laterality: N/A;  . Lysis of adhesion N/A 02/19/2013    Procedure: LYSIS OF ADHESION;  Surgeon: [name], MD;  Location: [facility];  Service: Gynecology;  Laterality: N/A;  . Abdominal hysterectomy      There were no vitals filed for this visit.  Visit Diagnosis:  Pain in joint, shoulder region, right - Plan: PT plan of care cert/re-cert  Stiffness in joint - Plan: PT plan of care cert/re-cert      Subjective Assessment - 08/18/14 0939    Subjective Patient was  kicking in the Rt. shoulder on 07/20/14 and went to [facility], XR, saw ortho.  She is limited in the efficiency with normal daily activities, she has to use caution and take things slowly.  She is on lihgt duty.  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    Limitations Lifting;House hold activities  sleeping   Diagnostic tests XR   Patient Stated Goals go back to normal   Currently in Pain? Yes  none at rest   Pain Score 4   in AM, improves as the day goes on   Pain Location Arm   Pain Orientation Anterior;Right;Proximal   Pain Descriptors / Indicators Tightness;Pressure   Pain Type Acute pain   Pain Onset More than a month ago  07/20/14   Pain Frequency Intermittent   Aggravating Factors  lifting, using arm    Pain Relieving Factors rest, ibuprofen   Effect of Pain on Daily Activities cannot do normal work activities   Multiple Pain Sites No            [facility] PT Assessment - 08/18/14 0946    Assessment   Medical Diagnosis Rt shoulder pain    Onset Date/Surgical Date 07/19/14   Hand Dominance Right   Next MD Visit 08/19/14   Prior Therapy no   Precautions   Precautions --  light duty   Restrictions   Weight  Bearing Restrictions No   Balance Screen   Has the patient fallen in the past 6 months No   [place] residence   Prior Function   Level of Independence Independent   Cognition   Overall Cognitive Status Within Functional Limits for tasks assessed   Observation/Other Assessments   Focus on Therapeutic Outcomes (FOTO)  34%  goal 28%   Sensation   Light Touch Appears Intact   Coordination   Gross Motor Movements are Fluid and Coordinated Not tested   Posture/Postural Control   Posture/Postural Control No significant limitations   AROM   Right Shoulder Extension 55 Degrees   Right Shoulder Flexion 160 Degrees  pain begins at 90 deg and inc   Right Shoulder ABduction 160 Degrees  pain   Right Shoulder Internal Rotation --   WNL   Right Shoulder External Rotation --  WFL, pain and incr time   PROM   PROM Assessment Site --  WNL pain end range   Strength   Right Shoulder Flexion 3+/5  pain   Right Shoulder ABduction 3+/5  pain   Right Shoulder Internal Rotation 5/5   Right Shoulder External Rotation 5/5   Right Shoulder Horizontal ABduction 4+/5   Right Shoulder Horizontal ADduction 3+/5  pain   Palpation   Palpation comment pain at coracoid process , none in pectorals, deltoid or post cuff            [facility] Adult PT Treatment/Exercise - 08/18/14 0946    Self-Care   Self-Care RICE;Posture   Shoulder Exercises: Standing   Horizontal ABduction Strengthening;Both;10 reps   Theraband Level (Shoulder Horizontal ABduction) Level 1 (Yellow)   Other Standing Exercises bicep curl yellow T band   Shoulder Exercises: Stretch   Corner Stretch 3 reps;20 seconds   Corner Stretch Limitations mild tingling in 5th digit           PT Education - 08/18/14 1015    Education provided Yes   Education Details PT/POC, HEP and RICE   Person(s) Educated Patient   Methods Explanation;Demonstration;Handout   Comprehension Verbalized understanding;Returned demonstration          PT Short Term Goals - 08/18/14 1459    PT SHORT TERM GOAL #1   Title Pt will be I with initial HEP    Time 2   Period Weeks   Status New   PT SHORT TERM GOAL #2   Title Pt will be able to use Rt. UE below the level of shoulder for cutting, reaching, lifting light items without pain incr.    Time 3   Period Weeks   Status New           PT Long Term Goals - 08/18/14 1500    PT LONG TERM GOAL #1   Title Patient will be I with advanced HEP    Time 6   Period Weeks   Status New   PT LONG TERM GOAL #2   Title Pt will be able to report use of RICE at home and understand pain control with exercises   Time 6   Period Weeks   Status New   PT LONG TERM GOAL #3   Title Patient will be able ot return to work full duty and  report no more than min pain increase on avg.    Time 6   Period Weeks   Status New   PT LONG TERM GOAL #4   Title Pt will  be able to demo safe lifting and body mech to prevent further injury.    Time 6   Period Weeks   Status New               Plan - 08/18/14 1322    Clinical Impression Statement This patient presents with soft tissue injury to Rt. anterior shoulder sustained while working with an agitated patient.  Her injury has resulted in her being on light duty and having to be very cautious with ADLs, normal mobility activities . She will benefit from skilled PT to improve comfort and function in Rt UE and allow to return to full work duties as Chartered certified accountant.    Pt will benefit from skilled therapeutic intervention in order to improve on the following deficits Increased fascial restricitons;Impaired UE functional use;Pain;Impaired flexibility;Decreased mobility;Decreased strength;Impaired sensation   Rehab Potential Excellent   PT Frequency 2x / week   PT Duration 6 weeks  12 visits approved   PT Treatment/Interventions Cryotherapy;Electrical Stimulation;Ultrasound;Therapeutic exercise;Therapeutic activities;Moist Heat;Iontophoresis 4mg /ml Dexamethasone;Functional mobility training;Patient/family education;Dry needling;Manual techniques;Neuromuscular re-education   PT Next Visit Plan check HEP, try [place] to Rt. ant shoulder, soft tissue/IASTM   PT Home Exercise Plan horiz abd, bicep curl and corner stretch   Consulted and Agree with Plan of Care Patient         Problem List Patient Active Problem List   Diagnosis Date Noted  . Breast nodule 03/25/2014  . Vaginal discharge 03/18/2014  . Vaginal itching 03/18/2014  . BV (bacterial vaginosis) 03/18/2014  . Pelvic peritoneal adhesions, female 02/19/2013  . Status post hysterectomy 02/19/2013    PAA,JENNIFER 08/18/2014, 3:11 PM  [facility] [address] [place],  [place], [address] Phone: [phone]   Fax:  [phone]   Raeford Razor, PT 08/18/2014 3:12 PM Phone: [phone] Fax: [phone]

## 2014-08-21 ENCOUNTER — Ambulatory Visit: Payer: 59 | Attending: Physician Assistant | Admitting: Physical Therapy

## 2014-08-21 DIAGNOSIS — M2560 Stiffness of unspecified joint, not elsewhere classified: Secondary | ICD-10-CM | POA: Diagnosis not present

## 2014-08-21 DIAGNOSIS — M25511 Pain in right shoulder: Secondary | ICD-10-CM | POA: Insufficient documentation

## 2014-08-21 NOTE — Therapy (Addendum)
[place], [place], [address] Phone: [phone]   Fax:  [phone]  Physical Therapy Treatment  Patient Details  Name: [name] MRN: [id number] Date of Birth: Apr 25, 1985 Referring Provider:  Cory Munch, PA-C  Encounter Date: 08/21/2014      PT End of Session - 08/21/14 1035    Visit Number 2   Number of Visits 12   Date for PT Re-Evaluation 09/29/14   PT Start Time 1034   PT Stop Time 1107   PT Time Calculation (min) 33 min      Past Medical History  Diagnosis Date  . Hypoglycemia   . Vaginal discharge 03/18/2014  . Vaginal itching 03/18/2014  . BV (bacterial vaginosis) 03/18/2014  . Breast nodule 03/25/2014    Past Surgical History  Procedure Laterality Date  . Cesarean section  2009, 2012    x2  . Tonsillectomy    . Tubal ligation    . Mass excision N/A 07/23/2012    Procedure: EXCISION OF RIGHT LOWER ABDOMINAL MASS, ENDOMETRIAL IMPLANT;  Surgeon: Scherry Ran, MD;  Location: [facility];  Service: General;  Laterality: N/A;  . Supracervical abdominal hysterectomy N/A 02/19/2013    Procedure: HYSTERECTOMY SUPRACERVICAL ABDOMINAL;  Surgeon: [name], MD;  Location: [facility];  Service: Gynecology;  Laterality: N/A;  . Scar revision N/A 02/19/2013    Procedure: EXCISION OF SCAR ENDOMETRIOSIS;  Surgeon: [name], MD;  Location: [facility];  Service: Gynecology;  Laterality: N/A;  . Lysis of adhesion N/A 02/19/2013    Procedure: LYSIS OF ADHESION;  Surgeon: [name], MD;  Location: [facility];  Service: Gynecology;  Laterality: N/A;  . Abdominal hysterectomy      There were no vitals filed for this visit.  Visit Diagnosis:  Stiffness in joint  Pain in joint, shoulder region, right      Subjective Assessment - 08/21/14 1035    Subjective Its not painful, it's just tight when I raise it.    Currently in Pain? No/denies                         [facility]  PT Treatment/Exercise - 08/21/14 1039    Shoulder Exercises: Supine   External Rotation Both;20 reps;Theraband   Flexion Right;20 reps;Theraband   Theraband Level (Shoulder Flexion) Level 1 (Yellow)   Flexion Limitations diagonals   Shoulder Exercises: Standing   Horizontal ABduction Strengthening;Both;20 reps   Theraband Level (Shoulder Horizontal ABduction) Level 1 (Yellow)   Other Standing Exercises bicep curl yellow T band   Shoulder Exercises: Stretch   Corner Stretch 3 reps;20 seconds   Modalities   Modalities Ultrasound   Ultrasound   Ultrasound Location R anterior shoulder   Ultrasound Parameters 1.8w/cm 2 100% 1MHZ   Ultrasound Goals Pain                  PT Short Term Goals - 08/18/14 1459    PT SHORT TERM GOAL #1   Title Pt will be I with initial HEP    Time 2   Period Weeks   Status New   PT SHORT TERM GOAL #2   Title Pt will be able to use Rt. UE below the level of shoulder for cutting, reaching, lifting light items without pain incr.    Time 3   Period Weeks   Status New           PT  Long Term Goals - 08/18/14 1500    PT LONG TERM GOAL #1   Title Patient will be I with advanced HEP    Time 6   Period Weeks   Status New   PT LONG TERM GOAL #2   Title Pt will be able to report use of RICE at home and understand pain control with exercises   Time 6   Period Weeks   Status New   PT LONG TERM GOAL #3   Title Patient will be able ot return to work full duty and report no more than min pain increase on avg.    Time 6   Period Weeks   Status New   PT LONG TERM GOAL #4   Title Pt will be able to demo safe lifting and body mech to prevent further injury.    Time 6   Period Weeks   Status New               Plan - 08/21/14 1111    Clinical Impression Statement Pt instructed in review of HEP as well as addiditon of supine shoulder ER and diagonals for HEP. Trial of Ultrasound with pt reporting right shoulder feeling looser afterwards  and no longer having a hard stop/stiffness.   PT Next Visit Plan Review HEP, Soft tissue/IASTM, repeat US if helpful, ionto        Problem List Patient Active Problem List   Diagnosis Date Noted  . Breast nodule 03/25/2014  . Vaginal discharge 03/18/2014  . Vaginal itching 03/18/2014  . BV (bacterial vaginosis) 03/18/2014  . Pelvic peritoneal adhesions, female 02/19/2013  . Status post hysterectomy 02/19/2013    Dorene Ar, PTA 08/21/2014, 11:14 AM  [facility] [address] [place], [place], [address] Phone: [phone]   Fax:  [phone]     PHYSICAL THERAPY DISCHARGE SUMMARY  Visits from Start of Care: 2  Current functional level related to goals / functional outcomes: See above, unknown has not returned   Remaining deficits: Unknown, has not returned   Education / Equipment: HEP, RICE Plan: Patient agrees to discharge.  Patient goals were not met. Patient is being discharged due to not returning since the last visit.  ?????   Raeford Razor, PT 12/12/2014 8:07 AM Phone: [phone] Fax: [phone]

## 2014-08-21 NOTE — Patient Instructions (Signed)
Sash   On back, knees bent, feet flat, left hand on left hip, right hand above left. Pull right arm DIAGONALLY (hip to shoulder) across chest. Bring right arm along head toward floor. Hold momentarily. Slowly return to starting position. Repeat _20__ times. Do with left arm. Band color __Y____   Shoulder Rotation: Double Arm   On back, knees bent, feet flat, elbows tucked at sides, bent 90, hands palms up. Pull hands apart and down toward floor, keeping elbows near sides. Hold momentarily. Slowly return to starting position. Repeat _20__ times. Band color __y____

## 2014-08-25 ENCOUNTER — Ambulatory Visit: Payer: 59 | Attending: Physician Assistant | Admitting: Physical Therapy

## 2014-08-27 ENCOUNTER — Ambulatory Visit: Payer: 59 | Admitting: Physical Therapy

## 2014-09-03 ENCOUNTER — Encounter: Payer: Self-pay | Admitting: Physical Therapy

## 2014-09-05 ENCOUNTER — Ambulatory Visit: Payer: 59 | Admitting: Physical Therapy

## 2014-09-09 ENCOUNTER — Ambulatory Visit: Payer: 59 | Admitting: Physical Therapy

## 2014-09-11 ENCOUNTER — Encounter: Payer: Self-pay | Admitting: Physical Therapy

## 2014-09-16 ENCOUNTER — Encounter: Payer: Self-pay | Admitting: Physical Therapy

## 2014-09-19 ENCOUNTER — Encounter: Payer: Self-pay | Admitting: Physical Therapy

## 2014-10-04 ENCOUNTER — Encounter (HOSPITAL_COMMUNITY): Payer: Self-pay | Admitting: Emergency Medicine

## 2014-10-04 ENCOUNTER — Emergency Department (HOSPITAL_COMMUNITY)
Admission: EM | Admit: 2014-10-04 | Discharge: 2014-10-04 | Disposition: A | Discharge status: Home / Self Care | Payer: 59 | Attending: Emergency Medicine | Admitting: Emergency Medicine

## 2014-10-04 DIAGNOSIS — R3915 Urgency of urination: Secondary | ICD-10-CM | POA: Diagnosis not present

## 2014-10-04 DIAGNOSIS — Z8742 Personal history of other diseases of the female genital tract: Secondary | ICD-10-CM | POA: Insufficient documentation

## 2014-10-04 DIAGNOSIS — Z872 Personal history of diseases of the skin and subcutaneous tissue: Secondary | ICD-10-CM | POA: Insufficient documentation

## 2014-10-04 DIAGNOSIS — R109 Unspecified abdominal pain: Secondary | ICD-10-CM | POA: Diagnosis present

## 2014-10-04 DIAGNOSIS — Z79899 Other long term (current) drug therapy: Secondary | ICD-10-CM | POA: Diagnosis not present

## 2014-10-04 DIAGNOSIS — Z7952 Long term (current) use of systemic steroids: Secondary | ICD-10-CM | POA: Insufficient documentation

## 2014-10-04 DIAGNOSIS — M62838 Other muscle spasm: Secondary | ICD-10-CM | POA: Diagnosis not present

## 2014-10-04 DIAGNOSIS — M545 Low back pain, unspecified: Secondary | ICD-10-CM

## 2014-10-04 DIAGNOSIS — Z8639 Personal history of other endocrine, nutritional and metabolic disease: Secondary | ICD-10-CM | POA: Insufficient documentation

## 2014-10-04 DIAGNOSIS — Z8619 Personal history of other infectious and parasitic diseases: Secondary | ICD-10-CM | POA: Insufficient documentation

## 2014-10-04 DIAGNOSIS — R112 Nausea with vomiting, unspecified: Secondary | ICD-10-CM | POA: Insufficient documentation

## 2014-10-04 LAB — URINALYSIS, ROUTINE W REFLEX MICROSCOPIC
Bilirubin Urine: NEGATIVE
Glucose, UA: 250 mg/dL — AB
Hgb urine dipstick: NEGATIVE
KETONES UR: NEGATIVE mg/dL
LEUKOCYTES UA: NEGATIVE
Nitrite: NEGATIVE
PROTEIN: NEGATIVE mg/dL
Specific Gravity, Urine: 1.02 (ref 1.005–1.030)
UROBILINOGEN UA: 0.2 mg/dL (ref 0.0–1.0)
pH: 6 (ref 5.0–8.0)

## 2014-10-04 MED ORDER — IBUPROFEN 800 MG PO TABS
800.0000 mg | ORAL_TABLET | Freq: Three times a day (TID) | ORAL | Status: DC
Start: 2014-10-04 — End: 2015-05-27

## 2014-10-04 MED ORDER — ONDANSETRON 4 MG PO TBDP
4.0000 mg | ORAL_TABLET | Freq: Once | ORAL | Status: AC
  Administered 2014-10-04: 4 mg via ORAL
  Filled 2014-10-04: qty 1

## 2014-10-04 MED ORDER — METHOCARBAMOL 500 MG PO TABS: 500.0000 mg | ORAL_TABLET | Freq: Two times a day (BID) | ORAL | Status: DC

## 2014-10-04 MED ORDER — HYDROCODONE-ACETAMINOPHEN 5-325 MG PO TABS
1.0000 | ORAL_TABLET | Freq: Once | ORAL | Status: AC
  Administered 2014-10-04: 1 via ORAL
  Filled 2014-10-04: qty 1

## 2014-10-04 MED ORDER — TRAMADOL HCL 50 MG PO TABS: 50.0000 mg | ORAL_TABLET | Freq: Four times a day (QID) | ORAL | Status: DC | PRN

## 2014-10-04 NOTE — Discharge Instructions (Signed)
Back Pain, Adult Back pain is very common. The pain often gets better over time. The cause of back pain is usually not dangerous. Most people can learn to manage their back pain on their own.  HOME CARE   Stay active. Start with short walks on flat ground if you can. Try to walk farther each day.  Do not sit, drive, or stand in one place for more than 30 minutes. Do not stay in bed.  Do not avoid exercise or work. Activity can help your back heal faster.  Be careful when you bend or lift an object. Bend at your knees, keep the object close to you, and do not twist.  Sleep on a firm mattress. Lie on your side, and bend your knees. If you lie on your back, put a pillow under your knees.  Only take medicines as told by your doctor.  Put ice on the injured area.  Put ice in a plastic bag.  Place a towel between your skin and the bag.  Leave the ice on for 15-20 minutes, 03-04 times a day for the first 2 to 3 days. After that, you can switch between ice and heat packs.  Ask your doctor about back exercises or massage.  Avoid feeling anxious or stressed. Find good ways to deal with stress, such as exercise. GET HELP RIGHT AWAY IF:   Your pain does not go away with rest or medicine.  Your pain does not go away in 1 week.  You have new problems.  You do not feel well.  The pain spreads into your legs.  You cannot control when you poop (bowel movement) or pee (urinate).  Your arms or legs feel weak or lose feeling (numbness).  You feel sick to your stomach (nauseous) or throw up (vomit).  You have belly (abdominal) pain.  You feel like you may pass out (faint). MAKE SURE YOU:   Understand these instructions.  Will watch your condition.  Will get help right away if you are not doing well or get worse. Document Released: 06/29/2007 Document Revised: 04/04/2011 Document Reviewed: 05/14/2013 [facility] Patient Information 2015 [place], [place]. This information is not intended  to replace advice given to you by your health care provider. Make sure you discuss any questions you have with your health care provider.  Muscle Cramps and Spasms Muscle cramps and spasms are when muscles tighten by themselves. They usually get better within minutes. Muscle cramps are painful. They are usually stronger and last longer than muscle spasms. Muscle spasms may or may not be painful. They can last a few seconds or much longer. HOME CARE  Drink enough fluid to keep your pee (urine) clear or pale yellow.  Massage, stretch, and relax the muscle.  Use a warm towel, heating pad, or warm shower water on tight muscles.  Place ice on the muscle if it is tender or in pain.  Put ice in a plastic bag.  Place a towel between your skin and the bag.  Leave the ice on for 15-20 minutes, 03-04 times a day.  Only take medicine as told by your doctor. GET HELP RIGHT AWAY IF:  Your cramps or spasms get worse, happen more often, or do not get better with time. MAKE SURE YOU:  Understand these instructions.  Will watch your condition.  Will get help right away if you are not doing well or get worse. Document Released: 12/24/2007 Document Revised: 05/07/2012 Document Reviewed: 12/28/2011 [employer] Patient Information 2015 [employer]. This information is not intended to replace advice given to you by your health care provider. Make sure you discuss any questions you have with your health care provider.

## 2014-10-04 NOTE — ED Notes (Signed)
Left flank pain, nausea, vomiting, voiding without difficult

## 2014-10-04 NOTE — ED Provider Notes (Signed)
CSN: [id number]     Arrival date & time 10/04/14  1437 History   First MD Initiated Contact with Patient 10/04/14 1447     Chief Complaint  Patient presents with  . Flank Pain      HPI  Patient presents for evaluation of left flank pain. She states she waking this morning and felt a sudden urge to empty her bladder. It was not uncomfortable or painful. No blood. No continued frequency or urgency throughout the day. She states that she started feeling a "heaviness on my kidney". This is left low back. It is worse with movement. States she became nauseated and has vomited twice. Still no dysuria frequency or hematuria. No previous kidney stones no previous kidney infections. Medications list hydrochlorothiazide. She states she used to take it for swelling but does not take it anymore.  Past Medical History  Diagnosis Date  . Hypoglycemia   . Vaginal discharge 03/18/2014  . Vaginal itching 03/18/2014  . BV (bacterial vaginosis) 03/18/2014  . Breast nodule 03/25/2014   Past Surgical History  Procedure Laterality Date  . Cesarean section  2009, 2012    x2  . Tonsillectomy    . Tubal ligation    . Mass excision N/A 07/23/2012    Procedure: EXCISION OF RIGHT LOWER ABDOMINAL MASS, ENDOMETRIAL IMPLANT;  Surgeon: Scherry Ran, MD;  Location: [facility];  Service: General;  Laterality: N/A;  . Supracervical abdominal hysterectomy N/A 02/19/2013    Procedure: HYSTERECTOMY SUPRACERVICAL ABDOMINAL;  Surgeon: [name], MD;  Location: [facility];  Service: Gynecology;  Laterality: N/A;  . Scar revision N/A 02/19/2013    Procedure: EXCISION OF SCAR ENDOMETRIOSIS;  Surgeon: [name], MD;  Location: [facility];  Service: Gynecology;  Laterality: N/A;  . Lysis of adhesion N/A 02/19/2013    Procedure: LYSIS OF ADHESION;  Surgeon: [name], MD;  Location: [facility];  Service: Gynecology;  Laterality: N/A;  . Abdominal hysterectomy     Family History  Problem Relation Age of Onset  . Hypertension  Father   . Diabetes Other   . Heart disease Other   . Colon cancer Other   . Heart disease Other    Social History  Substance Use Topics  . Smoking status: Never Smoker   . Smokeless tobacco: Never Used  . Alcohol Use: No   OB History    Gravida Para Term Preterm AB TAB SAB Ectopic Multiple Living   2 2             Review of Systems  Constitutional: Negative for fever, chills, diaphoresis, appetite change and fatigue.  HENT: Negative for mouth sores, sore throat and trouble swallowing.   Eyes: Negative for visual disturbance.  Respiratory: Negative for cough, chest tightness, shortness of breath and wheezing.   Cardiovascular: Negative for chest pain.  Gastrointestinal: Positive for nausea. Negative for vomiting, abdominal pain, diarrhea and abdominal distention.  Endocrine: Negative for polydipsia, polyphagia and polyuria.  Genitourinary: Positive for flank pain. Negative for dysuria, frequency and hematuria.  Musculoskeletal: Negative for gait problem.  Skin: Negative for color change, pallor and rash.  Neurological: Negative for dizziness, syncope, light-headedness and headaches.  Hematological: Does not bruise/bleed easily.  Psychiatric/Behavioral: Negative for behavioral problems and confusion.      Allergies  Codeine  Home Medications   Prior to Admission medications   Medication Sig Start Date End Date Taking? Authorizing Provider  predniSONE (DELTASONE) 10 MG tablet Take 10-50 mg by mouth daily. 09/25/14  Yes Historical Provider, MD  buPROPion (WELLBUTRIN XL) 150 MG 24 hr tablet Take 150 mg by mouth daily. 03/18/14   Historical Provider, MD  hydrochlorothiazide (HYDRODIURIL) 25 MG tablet TAKE 1 TABLET BY MOUTH EVERY DAY Patient not taking: Reported on 08/18/2014 04/20/13   [name], MD  ibuprofen (ADVIL,MOTRIN) 800 MG tablet Take 1 tablet (800 mg total) by mouth 3 (three) times daily. 10/04/14   Tanna Furry, MD  methocarbamol (ROBAXIN) 500 MG tablet Take 1  tablet (500 mg total) by mouth 2 (two) times daily. 10/04/14   Tanna Furry, MD  naproxen sodium (ANAPROX) 550 MG tablet Take 1 tablet (550 mg total) by mouth 2 (two) times daily with a meal. Patient not taking: Reported on 08/18/2014 03/25/14   Estill Dooms, NP  traMADol (ULTRAM) 50 MG tablet Take 1 tablet (50 mg total) by mouth every 6 (six) hours as needed. 10/04/14   Tanna Furry, MD   BP 130/88 mmHg  Pulse 73  Temp(Src) 98.1 F (36.7 C) (Oral)  Resp 18  Ht 5\' 1"  (1.549 m)  Wt 205 lb (92.987 kg)  BMI 38.75 kg/m2  SpO2 100%  LMP 01/08/2013 Physical Exam  Constitutional: She is oriented to person, place, and time. She appears well-developed and well-nourished. No distress.  HENT:  Head: Normocephalic.  Eyes: Conjunctivae are normal. Pupils are equal, round, and reactive to light. No scleral icterus.  Neck: Normal range of motion. Neck supple. No thyromegaly present.  Cardiovascular: Normal rate and regular rhythm.  Exam reveals no gallop and no friction rub.   No murmur heard. Pulmonary/Chest: Effort normal and breath sounds normal. No respiratory distress. She has no wheezes. She has no rales.  Abdominal: Soft. Bowel sounds are normal. She exhibits no distension. There is no tenderness. There is no rebound.  Musculoskeletal: Normal range of motion.       Back:  Neurological: She is alert and oriented to person, place, and time.  Skin: Skin is warm and dry. No rash noted.  Psychiatric: She has a normal mood and affect. Her behavior is normal.    ED Course  Procedures (including critical care time) Labs Review Labs Reviewed  URINALYSIS, ROUTINE W REFLEX MICROSCOPIC (NOT AT [facility]) - Abnormal; Notable for the following:    Glucose, UA 250 (*)    All other components within normal limits    Imaging Review No results found. I have personally reviewed and evaluated these images and lab results as part of my medical decision-making.   EKG Interpretation None      MDM    Final diagnoses:  Left-sided low back pain without sciatica  Muscle spasm    Acellular urine. Reproducible pain. Plan is treatment with antibiotics that was, pain medicine, muscle relaxant.    Tanna Furry, MD 10/04/14 [phone]

## 2015-03-09 DIAGNOSIS — L658 Other specified nonscarring hair loss: Secondary | ICD-10-CM | POA: Diagnosis not present

## 2015-03-09 DIAGNOSIS — R635 Abnormal weight gain: Secondary | ICD-10-CM | POA: Diagnosis not present

## 2015-03-09 DIAGNOSIS — R5383 Other fatigue: Secondary | ICD-10-CM | POA: Diagnosis not present

## 2015-03-09 DIAGNOSIS — R7309 Other abnormal glucose: Secondary | ICD-10-CM | POA: Diagnosis not present

## 2015-03-09 DIAGNOSIS — Z6838 Body mass index (BMI) 38.0-38.9, adult: Secondary | ICD-10-CM | POA: Diagnosis not present

## 2015-05-18 ENCOUNTER — Emergency Department (HOSPITAL_COMMUNITY)
Admission: EM | Admit: 2015-05-18 | Discharge: 2015-05-18 | Disposition: A | Discharge status: Home / Self Care | Payer: 59 | Attending: Emergency Medicine | Admitting: Emergency Medicine

## 2015-05-18 ENCOUNTER — Encounter (HOSPITAL_COMMUNITY): Payer: Self-pay

## 2015-05-18 DIAGNOSIS — Y999 Unspecified external cause status: Secondary | ICD-10-CM | POA: Insufficient documentation

## 2015-05-18 DIAGNOSIS — R110 Nausea: Secondary | ICD-10-CM | POA: Insufficient documentation

## 2015-05-18 DIAGNOSIS — Y92096 Garden or yard of other non-institutional residence as the place of occurrence of the external cause: Secondary | ICD-10-CM | POA: Insufficient documentation

## 2015-05-18 DIAGNOSIS — Y9389 Activity, other specified: Secondary | ICD-10-CM | POA: Insufficient documentation

## 2015-05-18 DIAGNOSIS — S50361A Insect bite (nonvenomous) of right elbow, initial encounter: Secondary | ICD-10-CM | POA: Insufficient documentation

## 2015-05-18 DIAGNOSIS — W57XXXA Bitten or stung by nonvenomous insect and other nonvenomous arthropods, initial encounter: Secondary | ICD-10-CM | POA: Insufficient documentation

## 2015-05-18 MED ORDER — DOXYCYCLINE HYCLATE 100 MG PO CAPS: 100.0000 mg | ORAL_CAPSULE | Freq: Two times a day (BID) | ORAL | Status: DC

## 2015-05-18 MED ORDER — PREDNISONE 20 MG PO TABS
40.0000 mg | ORAL_TABLET | Freq: Once | ORAL | Status: AC
  Administered 2015-05-18: 40 mg via ORAL
  Filled 2015-05-18: qty 2

## 2015-05-18 MED ORDER — ONDANSETRON HCL 4 MG PO TABS
4.0000 mg | ORAL_TABLET | Freq: Once | ORAL | Status: AC
  Administered 2015-05-18: 4 mg via ORAL
  Filled 2015-05-18: qty 1

## 2015-05-18 MED ORDER — DOXYCYCLINE HYCLATE 100 MG PO TABS
100.0000 mg | ORAL_TABLET | Freq: Once | ORAL | Status: AC
  Administered 2015-05-18: 100 mg via ORAL
  Filled 2015-05-18: qty 1

## 2015-05-18 MED ORDER — DEXAMETHASONE 4 MG PO TABS: 4.0000 mg | ORAL_TABLET | Freq: Two times a day (BID) | ORAL | Status: DC

## 2015-05-18 NOTE — Discharge Instructions (Signed)
Please cleanse the wound to your arm with soap and water daily. Please apply dressing until the wound seals. Please use Decadron and doxycycline 2 times daily with food. Please see Dr. Collene Mares for additional evaluation if any red streaks, fever, or pus like drainage from the wound area. Please wash hands frequently.

## 2015-05-18 NOTE — ED Provider Notes (Signed)
CSN: [id number]     Arrival date & time 05/18/15  1553 History  By signing my name below, I, Eustaquio Maize, attest that this documentation has been prepared under the direction and in the presence of Lily Kocher, PA-C.  Electronically Signed: Eustaquio Maize, ED Scribe. 05/18/2015. 5:09 PM.   Chief Complaint  Patient presents with  . Insect Bite   The history is provided by the patient. No language interpreter was used.     HPI Comments: [name] is a 30 y.o. female who presents to the Emergency Department complaining of possible insect bite to right elbow that occurred 3-4 days ago. Pt states that she was laying mulch in her yard 4 days ago but did not notice the area until the next day. The area has been increasing in size and redness since onset. She also complains of nausea.Denies fever, chills, vomiting, or any other associated symptoms.    Past Medical History  Diagnosis Date  . Hypoglycemia   . Vaginal discharge 03/18/2014  . Vaginal itching 03/18/2014  . BV (bacterial vaginosis) 03/18/2014  . Breast nodule 03/25/2014   Past Surgical History  Procedure Laterality Date  . Cesarean section  2009, 2012    x2  . Tonsillectomy    . Tubal ligation    . Mass excision N/A 07/23/2012    Procedure: EXCISION OF RIGHT LOWER ABDOMINAL MASS, ENDOMETRIAL IMPLANT;  Surgeon: Scherry Ran, MD;  Location: [facility];  Service: General;  Laterality: N/A;  . Supracervical abdominal hysterectomy N/A 02/19/2013    Procedure: HYSTERECTOMY SUPRACERVICAL ABDOMINAL;  Surgeon: [name], MD;  Location: [facility];  Service: Gynecology;  Laterality: N/A;  . Scar revision N/A 02/19/2013    Procedure: EXCISION OF SCAR ENDOMETRIOSIS;  Surgeon: [name], MD;  Location: [facility];  Service: Gynecology;  Laterality: N/A;  . Lysis of adhesion N/A 02/19/2013    Procedure: LYSIS OF ADHESION;  Surgeon: [name], MD;  Location: [facility];  Service: Gynecology;  Laterality: N/A;  . Abdominal hysterectomy      Family History  Problem Relation Age of Onset  . Hypertension Father   . Diabetes Other   . Heart disease Other   . Colon cancer Other   . Heart disease Other    Social History  Substance Use Topics  . Smoking status: Never Smoker   . Smokeless tobacco: Never Used  . Alcohol Use: No   OB History    Gravida Para Term Preterm AB TAB SAB Ectopic Multiple Living   2 2             Review of Systems  Constitutional: Negative for fever and chills.  Gastrointestinal: Positive for nausea. Negative for vomiting.  Skin: Positive for color change.       + Possible insect bite to right elbow  All other systems reviewed and are negative.  Allergies  Codeine  Home Medications   Prior to Admission medications   Medication Sig Start Date End Date Taking? Authorizing Provider  buPROPion (WELLBUTRIN XL) 150 MG 24 hr tablet Take 150 mg by mouth daily. 03/18/14   Historical Provider, MD  hydrochlorothiazide (HYDRODIURIL) 25 MG tablet TAKE 1 TABLET BY MOUTH EVERY DAY Patient not taking: Reported on 08/18/2014 04/20/13   [name], MD  ibuprofen (ADVIL,MOTRIN) 800 MG tablet Take 1 tablet (800 mg total) by mouth 3 (three) times daily. 10/04/14   Tanna Furry, MD  methocarbamol (ROBAXIN) 500 MG tablet Take 1 tablet (500 mg  total) by mouth 2 (two) times daily. 10/04/14   Tanna Furry, MD  naproxen sodium (ANAPROX) 550 MG tablet Take 1 tablet (550 mg total) by mouth 2 (two) times daily with a meal. Patient not taking: Reported on 08/18/2014 03/25/14   Estill Dooms, NP  predniSONE (DELTASONE) 10 MG tablet Take 10-50 mg by mouth daily. 09/25/14   Historical Provider, MD  traMADol (ULTRAM) 50 MG tablet Take 1 tablet (50 mg total) by mouth every 6 (six) hours as needed. 10/04/14   Tanna Furry, MD   BP 140/84 mmHg  Pulse 74  Temp(Src) 98.7 F (37.1 C) (Oral)  Resp 18  Ht 5\' 1"  (1.549 m)  Wt 210 lb (95.255 kg)  BMI 39.70 kg/m2  SpO2 100%  LMP 01/08/2013   Physical Exam  Constitutional: She is  oriented to person, place, and time. She appears well-developed and well-nourished. No distress.  HENT:  Head: Normocephalic and atraumatic.  Eyes: Conjunctivae and EOM are normal.  Neck: Neck supple. No tracheal deviation present.  Cardiovascular: Normal rate, regular rhythm and normal heart sounds.   Pulmonary/Chest: Effort normal and breath sounds normal. No respiratory distress. She has no wheezes. She has no rales.  Musculoskeletal: Normal range of motion.  There are no palpable nodes in the right axillary area or bicep/tricep area.  There is a 6 x 7 cm area of increased redness just below the elbow in the RUE. There is a small blister present with mild drainage. No red streaks appreciated. The upper extremity is not hot.  Radial pulse 2+.  Capillary refill < 2 seconds.  There are no temperature changes of the right hand.   Neurological: She is alert and oriented to person, place, and time.  Skin: Skin is warm and dry.  Psychiatric: She has a normal mood and affect. Her behavior is normal.  Nursing note and vitals reviewed.   ED Course  Procedures (including critical care time)  DIAGNOSTIC STUDIES: Oxygen Saturation is 100% on RA, normal by my interpretation.    COORDINATION OF CARE: 5:08 PM-Discussed treatment plan which includes Rx antibiotic and prednisone with pt at bedside and pt agreed to plan.   Labs Review Labs Reviewed - No data to display  Imaging Review No results found.    EKG Interpretation None      MDM The examination favors an insect bite to the right elbow and upper arm area. Vital signs within normal limits. The patient will be treated with both doxycycline and Decadron to cover both possible infection, as well as inflammation. I have asked the patient to change dressings daily. The patient is to see the primary physician or return to the emergency department if any signs of advancing infection or other issues or problems. The patient is in agreement  with this discharge plan.    Final diagnoses:  Insect bite    *I have reviewed nursing notes, vital signs, and all appropriate lab and imaging results for this patient.**  I personally performed the services described in this documentation, which was scribed in my presence. The recorded information has been reviewed and is accurate.    Lily Kocher, PA-C 05/18/15 [place], MD 05/19/15 [phone]

## 2015-05-18 NOTE — ED Notes (Signed)
C/o insect bite to right elbow. Patient states yellow/clear drainage from site.

## 2015-05-27 ENCOUNTER — Observation Stay (HOSPITAL_COMMUNITY)
Admission: EM | Admit: 2015-05-27 | Discharge: 2015-05-28 | Disposition: A | Discharge status: Home / Self Care | Payer: Self-pay | Attending: Internal Medicine | Admitting: Internal Medicine

## 2015-05-27 ENCOUNTER — Emergency Department (HOSPITAL_COMMUNITY): Payer: Self-pay

## 2015-05-27 ENCOUNTER — Emergency Department (HOSPITAL_COMMUNITY): Payer: MEDICAID

## 2015-05-27 ENCOUNTER — Encounter (HOSPITAL_COMMUNITY): Payer: Self-pay | Admitting: Emergency Medicine

## 2015-05-27 DIAGNOSIS — Z538 Procedure and treatment not carried out for other reasons: Secondary | ICD-10-CM | POA: Insufficient documentation

## 2015-05-27 DIAGNOSIS — R291 Meningismus: Secondary | ICD-10-CM | POA: Diagnosis present

## 2015-05-27 DIAGNOSIS — Z792 Long term (current) use of antibiotics: Secondary | ICD-10-CM | POA: Insufficient documentation

## 2015-05-27 DIAGNOSIS — H539 Unspecified visual disturbance: Secondary | ICD-10-CM

## 2015-05-27 DIAGNOSIS — M2569 Stiffness of other specified joint, not elsewhere classified: Secondary | ICD-10-CM | POA: Diagnosis present

## 2015-05-27 DIAGNOSIS — D72829 Elevated white blood cell count, unspecified: Principal | ICD-10-CM | POA: Diagnosis present

## 2015-05-27 DIAGNOSIS — D751 Secondary polycythemia: Secondary | ICD-10-CM | POA: Diagnosis present

## 2015-05-27 DIAGNOSIS — Z7952 Long term (current) use of systemic steroids: Secondary | ICD-10-CM | POA: Insufficient documentation

## 2015-05-27 DIAGNOSIS — M2560 Stiffness of unspecified joint, not elsewhere classified: Secondary | ICD-10-CM

## 2015-05-27 DIAGNOSIS — M436 Torticollis: Secondary | ICD-10-CM | POA: Diagnosis present

## 2015-05-27 DIAGNOSIS — M542 Cervicalgia: Secondary | ICD-10-CM | POA: Insufficient documentation

## 2015-05-27 LAB — URINALYSIS, ROUTINE W REFLEX MICROSCOPIC
BILIRUBIN URINE: NEGATIVE
GLUCOSE, UA: NEGATIVE mg/dL
HGB URINE DIPSTICK: NEGATIVE
Ketones, ur: NEGATIVE mg/dL
Leukocytes, UA: NEGATIVE
Nitrite: NEGATIVE
PROTEIN: NEGATIVE mg/dL
Specific Gravity, Urine: 1.015 (ref 1.005–1.030)
pH: 6.5 (ref 5.0–8.0)

## 2015-05-27 LAB — CBC WITH DIFFERENTIAL/PLATELET
BASOS ABS: 0 10*3/uL (ref 0.0–0.1)
Basophils Relative: 0 %
Eosinophils Absolute: 0.2 10*3/uL (ref 0.0–0.7)
Eosinophils Relative: 1 %
HEMATOCRIT: 49.7 % — AB (ref 36.0–46.0)
HEMOGLOBIN: 17.1 g/dL — AB (ref 12.0–15.0)
LYMPHS ABS: 4.1 10*3/uL — AB (ref 0.7–4.0)
Lymphocytes Relative: 23 %
MCH: 29.4 pg (ref 26.0–34.0)
MCHC: 34.4 g/dL (ref 30.0–36.0)
MCV: 85.5 fL (ref 78.0–100.0)
MONOS PCT: 8 %
Monocytes Absolute: 1.4 10*3/uL — ABNORMAL HIGH (ref 0.1–1.0)
NEUTROS ABS: 11.7 10*3/uL — AB (ref 1.7–7.7)
NEUTROS PCT: 68 %
Platelets: 264 10*3/uL (ref 150–400)
RBC: 5.81 MIL/uL — AB (ref 3.87–5.11)
RDW: 13.2 % (ref 11.5–15.5)
WBC: 17.5 10*3/uL — AB (ref 4.0–10.5)

## 2015-05-27 LAB — GLUCOSE, CSF: Glucose, CSF: 58 mg/dL (ref 40–70)

## 2015-05-27 LAB — CSF CELL COUNT WITH DIFFERENTIAL
RBC COUNT CSF: 22 /mm3 — AB
TUBE #: 4
WBC CSF: 1 /mm3 (ref 0–5)

## 2015-05-27 LAB — COMPREHENSIVE METABOLIC PANEL
ALK PHOS: 71 U/L (ref 38–126)
ALT: 16 U/L (ref 14–54)
AST: 13 U/L — AB (ref 15–41)
Albumin: 3.7 g/dL (ref 3.5–5.0)
Anion gap: 9 (ref 5–15)
BILIRUBIN TOTAL: 0.7 mg/dL (ref 0.3–1.2)
BUN: 15 mg/dL (ref 6–20)
CO2: 31 mmol/L (ref 22–32)
CREATININE: 0.94 mg/dL (ref 0.44–1.00)
Calcium: 9.1 mg/dL (ref 8.9–10.3)
Chloride: 99 mmol/L — ABNORMAL LOW (ref 101–111)
GFR calc Af Amer: >60 mL/min (ref >60)
GLUCOSE: 89 mg/dL (ref 65–99)
Potassium: 4.1 mmol/L (ref 3.5–5.1)
Sodium: 139 mmol/L (ref 135–145)
TOTAL PROTEIN: 6.8 g/dL (ref 6.5–8.1)

## 2015-05-27 LAB — RAPID HIV SCREEN (HIV 1/2 AB+AG)
HIV 1/2 ANTIBODIES: NONREACTIVE
HIV-1 P24 Antigen - HIV24: NONREACTIVE
Interpretation (HIV Ag Ab): NONREACTIVE

## 2015-05-27 LAB — SEDIMENTATION RATE: SED RATE: 2 mm/h (ref 0–22)

## 2015-05-27 LAB — PROTEIN, CSF: Total  Protein, CSF: 24 mg/dL (ref 15–45)

## 2015-05-27 LAB — PREGNANCY, URINE: PREG TEST UR: NEGATIVE

## 2015-05-27 MED ORDER — ONDANSETRON HCL 4 MG/2ML IJ SOLN
4.0000 mg | Freq: Once | INTRAMUSCULAR | Status: AC
  Administered 2015-05-27: 4 mg via INTRAVENOUS

## 2015-05-27 MED ORDER — ONDANSETRON HCL 4 MG PO TABS: 4.0000 mg | ORAL_TABLET | Freq: Four times a day (QID) | ORAL | Status: DC | PRN

## 2015-05-27 MED ORDER — FENTANYL CITRATE (PF) 100 MCG/2ML IJ SOLN
25.0000 ug | INTRAMUSCULAR | Status: DC | PRN
  Administered 2015-05-28: 25 ug via INTRAVENOUS
  Filled 2015-05-27: qty 2

## 2015-05-27 MED ORDER — ONDANSETRON HCL 4 MG/2ML IJ SOLN: 4.0000 mg | Freq: Four times a day (QID) | INTRAMUSCULAR | Status: DC | PRN

## 2015-05-27 MED ORDER — KETOROLAC TROMETHAMINE 30 MG/ML IJ SOLN
30.0000 mg | Freq: Once | INTRAMUSCULAR | Status: AC
  Administered 2015-05-27: 30 mg via INTRAVENOUS
  Filled 2015-05-27: qty 1

## 2015-05-27 MED ORDER — SODIUM CHLORIDE 0.9 % IV BOLUS (SEPSIS)
1000.0000 mL | Freq: Once | INTRAVENOUS | Status: AC
  Administered 2015-05-27: 1000 mL via INTRAVENOUS

## 2015-05-27 MED ORDER — DEXTROSE 5 % IV SOLN
2.0000 g | Freq: Two times a day (BID) | INTRAVENOUS | Status: DC
  Administered 2015-05-28: 2 g via INTRAVENOUS
  Filled 2015-05-27 (×3): qty 2

## 2015-05-27 MED ORDER — ACETAMINOPHEN 325 MG PO TABS: 650.0000 mg | ORAL_TABLET | Freq: Four times a day (QID) | ORAL | Status: DC | PRN

## 2015-05-27 MED ORDER — SODIUM CHLORIDE 0.9 % IV SOLN
INTRAVENOUS | Status: DC
  Administered 2015-05-27 – 2015-05-28 (×3): via INTRAVENOUS

## 2015-05-27 MED ORDER — ONDANSETRON HCL 4 MG/2ML IJ SOLN
INTRAMUSCULAR | Status: AC
  Administered 2015-05-27: 4 mg via INTRAVENOUS
  Filled 2015-05-27: qty 2

## 2015-05-27 MED ORDER — ACETAMINOPHEN 650 MG RE SUPP: 650.0000 mg | Freq: Four times a day (QID) | RECTAL | Status: DC | PRN

## 2015-05-27 MED ORDER — PANTOPRAZOLE SODIUM 40 MG IV SOLR
INTRAVENOUS | Status: AC
  Administered 2015-05-27: 40 mg via INTRAVENOUS
  Filled 2015-05-27: qty 40

## 2015-05-27 MED ORDER — PANTOPRAZOLE SODIUM 40 MG IV SOLR
40.0000 mg | Freq: Once | INTRAVENOUS | Status: AC
  Administered 2015-05-27: 40 mg via INTRAVENOUS

## 2015-05-27 MED ORDER — DEXTROSE 5 % IV SOLN: 2.0000 g | INTRAVENOUS | Status: DC

## 2015-05-27 MED ORDER — DEXTROSE 5 % IV SOLN
2.0000 g | Freq: Once | INTRAVENOUS | Status: AC
  Administered 2015-05-27: 2 g via INTRAVENOUS
  Filled 2015-05-27: qty 2

## 2015-05-27 NOTE — Progress Notes (Signed)
ANTIBIOTIC CONSULT NOTE - INITIAL  Pharmacy Consult for Rocephin Indication: meningitis  Allergies  Allergen Reactions  . Codeine Hives and Itching    Patient Measurements: Height: 5\' 2"  (157.5 cm) Weight: 210 lb (95.255 kg) IBW/kg (Calculated) : 50.1   Vital Signs: Temp: 98.4 F (36.9 C) (05/03 1845) Temp Source: Oral (05/03 1845) BP: 108/77 mmHg (05/03 1845) Pulse Rate: 86 (05/03 1845) Intake/Output from previous day:   Intake/Output from this shift:    Labs:  Recent Labs  05/27/15 1013  WBC 17.5*  HGB 17.1*  PLT 264  CREATININE 0.94   Estimated Creatinine Clearance: 96.8 mL/min (by C-G formula based on Cr of 0.94). No results for input(s): VANCOTROUGH, VANCOPEAK, VANCORANDOM, GENTTROUGH, GENTPEAK, GENTRANDOM, TOBRATROUGH, TOBRAPEAK, TOBRARND, AMIKACINPEAK, AMIKACINTROU, AMIKACIN in the last 72 hours.   Microbiology: Recent Results (from the past 720 hour(s))  CSF culture     Status: None (Preliminary result)   Collection Time: 05/27/15  3:08 PM  Result Value Ref Range Status   Specimen Description CSF  Final   Special Requests NONE  Final   Gram Stain   Final    CYTOSPIN SMEAR WBC PRESENT, PREDOMINANTLY MONONUCLEAR FUSIFORM NO ORGANISMS SEEN IN PAIRS Performed at [facility]    Culture PENDING  Incomplete   Report Status PENDING  Incomplete    Medical History: Past Medical History  Diagnosis Date  . Hypoglycemia   . Vaginal discharge 03/18/2014  . Vaginal itching 03/18/2014  . BV (bacterial vaginosis) 03/18/2014  . Breast nodule 03/25/2014    Medications:  Scheduled:     Assessment: Okay for Protocol, Recent Doxycyline therapy for insect bite on right elbow.  To be admitted now for symptom of potential meningitis.  Goal of Therapy:  Eradicate infection.   Plan:  Follow up culture results  Rocephin 2gm IV every 12 hours. Monitor labs, micro and vitals.   Pricilla Larsson 05/27/2015,7:24 PM

## 2015-05-27 NOTE — ED Notes (Signed)
RN unable to take report at this time 

## 2015-05-27 NOTE — ED Provider Notes (Signed)
Sign out pending results of CSF studies. No evidence of meningitis. Patient continues to have neck rigidity and pain. No definite neurologic deficits. Right elbow with rash concerning for erythema migrans. Discussed with Dr. Tommy Medal. Recommends IV Rocephin and observation. May need neuro consult and MRI. Discussed with Dr. Tamala Julian. Will admit to MedSurg bed.  Julianne Rice, MD 05/27/15 [phone]

## 2015-05-27 NOTE — ED Notes (Signed)
Patient not tolerating Lumbar Puncture well. States lightheadedness and nausea. MD stopped procedure. Patient vomited x 3. Verbal orders for zofran and protonix obtained. Patient remains alert. States vomiting eased nausea and lightheadedness.

## 2015-05-27 NOTE — ED Notes (Addendum)
Pt reports was seen last week for a spider bite and was started on abx,steroids. Pt reports last dose of abx x2 days ago. Pt reports generalized edema/weakness,back pain and tenderness, peripheral vision blurry x2 days. nad noted. Pt denies any known or recent injury. Pt reports husband had similar symptoms and was recently diagnosed with lyme disease.

## 2015-05-27 NOTE — ED Notes (Signed)
Patient requesting pain medication. Orders obtained.

## 2015-05-27 NOTE — ED Provider Notes (Signed)
CSN: [id number]     Arrival date & time 05/27/15  [id number] History  By signing my name below, I, [name], attest that this documentation has been prepared under the direction and in the presence of Loretta Christen, MD Electronically Signed: Ladene Lopez, ED Scribe 05/27/2015 at 11:01 AM.   Chief Complaint  Patient presents with  . Back Pain   The history is provided by the patient. No language interpreter was used.   HPI Comments: [name] is a 30 y.o. female who presents to the Emergency Department complaining of gradually worsening upper back/neck pain onset 2 days ago. Pt was seen in the ED 8 days ago for "insect bite" in right medial elbow and was started on antibiotics and prednison. Her last dose was 2 days ago. Today, she reports worsening upper back pain that radiates into her neck, worse with movement and palpation. Pt reports associated symptoms of hot flashes with diaphoresis, blurry peripheral vision x2 days, eye pain with looking to the right and left; and unsteady gait. No treatments tried PTA. She denies fever, chills, appetite change, dizziness.   Past Medical History  Diagnosis Date  . Hypoglycemia   . Vaginal discharge 03/18/2014  . Vaginal itching 03/18/2014  . BV (bacterial vaginosis) 03/18/2014  . Breast nodule 03/25/2014   Past Surgical History  Procedure Laterality Date  . Cesarean section  2009, 2012    x2  . Tonsillectomy    . Tubal ligation    . Mass excision N/A 07/23/2012    Procedure: EXCISION OF RIGHT LOWER ABDOMINAL MASS, ENDOMETRIAL IMPLANT;  Surgeon: Loretta Ran, MD;  Location: [facility];  Service: General;  Laterality: N/A;  . Supracervical abdominal hysterectomy N/A 02/19/2013    Procedure: HYSTERECTOMY SUPRACERVICAL ABDOMINAL;  Surgeon: Loretta Kind, MD;  Location: [facility];  Service: Gynecology;  Laterality: N/A;  . Scar revision N/A 02/19/2013    Procedure: EXCISION OF SCAR ENDOMETRIOSIS;  Surgeon: Loretta Kind, MD;  Location: [facility];  Service:  Gynecology;  Laterality: N/A;  . Lysis of adhesion N/A 02/19/2013    Procedure: LYSIS OF ADHESION;  Surgeon: Loretta Kind, MD;  Location: [facility];  Service: Gynecology;  Laterality: N/A;  . Abdominal hysterectomy     Family History  Problem Relation Age of Onset  . Hypertension Father   . Diabetes Other   . Heart disease Other   . Colon cancer Other   . Heart disease Other    Social History  Substance Use Topics  . Smoking status: Never Smoker   . Smokeless tobacco: Never Used  . Alcohol Use: No   OB History    Gravida Para Term Preterm AB TAB SAB Ectopic Multiple Living   2 2             Review of Systems  A complete 10 system review of systems was obtained and all systems are negative except as noted in the HPI and PMH.  Allergies  Codeine  Home Medications   Prior to Admission medications   Medication Sig Start Date End Date Taking? Authorizing Provider  dexamethasone (DECADRON) 4 MG tablet Take 1 tablet (4 mg total) by mouth 2 (two) times daily with a meal. Patient not taking: Reported on 05/27/2015 05/18/15   Loretta Kocher, PA-C  doxycycline (VIBRAMYCIN) 100 MG capsule Take 1 capsule (100 mg total) by mouth 2 (two) times daily. Patient not taking: Reported on 05/27/2015 05/18/15   Loretta Kocher, PA-C   BP 128/89 mmHg  Pulse 91  Temp(Src) 97.7 F (36.5 C) (Oral)  Resp 18  Ht 5\' 2"  (1.575 m)  Wt 210 lb (95.255 kg)  BMI 38.40 kg/m2  SpO2 100%  LMP 01/08/2013 Physical Exam  Constitutional: She is oriented to person, place, and time. She appears well-developed and well-nourished.  HENT:  Head: Normocephalic and atraumatic.  ? Pain c lateral gaze bilaterally  Eyes: Conjunctivae and EOM are normal. Pupils are equal, round, and reactive to light.  Neck: Normal range of motion. Neck supple.  Min pain c flexion  Cardiovascular: Normal rate and regular rhythm.   Pulmonary/Chest: Effort normal and breath sounds normal.  Abdominal: Soft. Bowel sounds are normal.   Musculoskeletal: Normal range of motion.  Tender in mid upper back extending to neck  Neurological: She is alert and oriented to person, place, and time.  Skin: Skin is warm and dry.  Psychiatric: She has a normal mood and affect. Her behavior is normal.  Nursing note and vitals reviewed.  ED Course  .Lumbar Puncture Date/Time: 05/27/2015 1:45 PM Performed by: [name] Authorized by: [name] Consent: Verbal consent obtained. Written consent obtained. Risks and benefits: risks, benefits and alternatives were discussed Consent given by: patient Patient understanding: patient states understanding of the procedure being performed Patient consent: the patient's understanding of the procedure matches consent given Relevant documents: relevant documents present and verified Site marked: the operative site was marked Imaging studies: imaging studies available Comments: Lumbar puncture attempted at the L3-4 interspace with local anesthesia 1% Xylocaine 4 mL. Procedure was terminated secondary to patient's vomiting. Will obtain fluoroscopy-guided lumbar puncture   (including critical care time) DIAGNOSTIC STUDIES: Oxygen Saturation is 100% on RA, normal by my interpretation.    COORDINATION OF CARE: .9:43 AM-Discussed treatment plan which includes lab work and check glucose with pt at bedside and pt agreed to plan.   Labs Review Labs Reviewed  CBC WITH DIFFERENTIAL/PLATELET - Abnormal; Notable for the following:    WBC 17.5 (*)    RBC 5.81 (*)    Hemoglobin 17.1 (*)    HCT 49.7 (*)    Neutro Abs 11.7 (*)    Lymphs Abs 4.1 (*)    Monocytes Absolute 1.4 (*)    All other components within normal limits  COMPREHENSIVE METABOLIC PANEL - Abnormal; Notable for the following:    Chloride 99 (*)    AST 13 (*)    All other components within normal limits  GRAM STAIN  CSF CULTURE  URINALYSIS, ROUTINE W REFLEX MICROSCOPIC (NOT AT [facility])  PREGNANCY, URINE  B. BURGDORFI ANTIBODIES   ROCKY MTN SPOTTED FVR ABS PNL(IGG+IGM)  GLUCOSE, CSF  PROTEIN, CSF  CSF CELL COUNT WITH DIFFERENTIAL   Imaging Review Ct Head Wo Contrast  05/27/2015  CLINICAL DATA:  Left seen for spider bite generalize weakness. Blurry vision. EXAM: CT HEAD WITHOUT CONTRAST TECHNIQUE: Contiguous axial images were obtained from the base of the skull through the vertex without intravenous contrast. COMPARISON:  None. FINDINGS: No acute cortical infarct, hemorrhage, or mass lesion ispresent. Ventricles are of normal size. No significant extra-axial fluid collection is present. The paranasal sinuses andmastoid air cells are clear. The osseous skull is intact. IMPRESSION: Normal brain. Electronically Signed   By: Kerby Moors M.D.   On: 05/27/2015 13:47   I have personally reviewed and evaluated these images and lab results as part of my medical decision-making.   EKG Interpretation None      MDM   Final diagnoses:  Neck pain  Leukocytosis    Uncertain etiology of patient's neck pain and back pain. She was apparently bitten on the right medial elbow approximately 1 week ago. [place] spotted fever and Lyme titers obtained. Lumbar puncture failed by examiner. Will obtain fluoroscopy-guided LP. Discussed with patient and her husband.  I personally performed the services described in this documentation, which was scribed in my presence. The recorded information has been reviewed and is accurate.     Loretta Christen, MD 05/27/15 [phone]

## 2015-05-27 NOTE — H&P (Signed)
History and Physical    [name] [id number] DOB: 1985-07-17 DOA: 05/27/2015  Referring MD/NP/PA: Dr. Lacinda Axon PCP: Collene Mares, PA-C  Outpatient Specialists:  Patient coming from: Home  Chief Complaint: Back and neck pain  HPI: [name] is a 30 y.o. female with a relatively unremarkable past medical history; who presents with complaints of back and neck pain that has progressively worsened over the last 2 days. Apparently 12 days ago she had been laying in mulch and possibly gotten bitten by an insect that she noticed the subsequent day to the right medial aspect of the elbow. She initially thought nothing of it, but developed a red rash that was also increasing in size. 8 days ago, she went to the emergency department and was evaluated for the rash. She was given prescriptions for doxycycline and prednisone. The following day she started taking the prescription as prescribed. Over this past weekend her and her family went camping and she completed her antibiotics of doxycycline 2 days ago(5/1). Since that time she's had this progressively ascending soreness that started in the middle of her back and goes up into her neck. Symptoms radiate across both shoulders. Symptoms worsened by movement and touch. Associated symptoms of hot flashes, diaphoresis, blurry vision when looking laterally with eye pain. Patient tried nothing prior to arrival denies any vomiting, change in appetite, lightheadedness, loss of consciousness, mentation change, focal weakness, fever, chest pain, or palpitations.   ED Course: Upon admission to the emergency department patient was seen have normal vital signs. Lab work revealed WBC of 17.5, hemoglobin of 17.1, platelets of 264. CMP was relatively unremarkable. Urinalysis was negative. A lumbar puncture was obtained which showed glucose 58, total protein 24, and red blood cell count  22. Infectious disease was consulted and recommended placing patient on cefepime and  they will see the patient in the morning.   Review of Systems: As per HPI otherwise 10 point review of systems negative.  Past Medical History  Diagnosis Date  . Hypoglycemia   . Vaginal discharge 03/18/2014  . Vaginal itching 03/18/2014  . BV (bacterial vaginosis) 03/18/2014  . Breast nodule 03/25/2014    Past Surgical History  Procedure Laterality Date  . Cesarean section  2009, 2012    x2  . Tonsillectomy    . Tubal ligation    . Mass excision N/A 07/23/2012    Procedure: EXCISION OF RIGHT LOWER ABDOMINAL MASS, ENDOMETRIAL IMPLANT;  Surgeon: Scherry Ran, MD;  Location: [facility];  Service: General;  Laterality: N/A;  . Supracervical abdominal hysterectomy N/A 02/19/2013    Procedure: HYSTERECTOMY SUPRACERVICAL ABDOMINAL;  Surgeon: [name], MD;  Location: [facility];  Service: Gynecology;  Laterality: N/A;  . Scar revision N/A 02/19/2013    Procedure: EXCISION OF SCAR ENDOMETRIOSIS;  Surgeon: [name], MD;  Location: [facility];  Service: Gynecology;  Laterality: N/A;  . Lysis of adhesion N/A 02/19/2013    Procedure: LYSIS OF ADHESION;  Surgeon: [name], MD;  Location: [facility];  Service: Gynecology;  Laterality: N/A;  . Abdominal hysterectomy       reports that she has never smoked. She has never used smokeless tobacco. She reports that she does not drink alcohol or use illicit drugs.  Allergies  Allergen Reactions  . Codeine Hives and Itching    Family History  Problem Relation Age of Onset  . Hypertension Father   . Diabetes Other   . Heart disease Other   . Colon cancer Other   .  Heart disease Other     Prior to Admission medications   Medication Sig Start Date End Date Taking? Authorizing Provider  dexamethasone (DECADRON) 4 MG tablet Take 1 tablet (4 mg total) by mouth 2 (two) times daily with a meal. Patient not taking: Reported on 05/27/2015 05/18/15   Lily Kocher, PA-C  doxycycline (VIBRAMYCIN) 100 MG capsule Take 1 capsule (100 mg total) by mouth 2  (two) times daily. Patient not taking: Reported on 05/27/2015 05/18/15   Lily Kocher, PA-C    Physical Exam: Filed Vitals:   05/27/15 0856 05/27/15 1247 05/27/15 1547 05/27/15 1845  BP: 128/89 130/80 106/64 108/77  Pulse: 91 92 72 86  Temp: 97.7 F (36.5 C) 97.9 F (36.6 C)  98.4 F (36.9 C)  TempSrc: Oral Oral  Oral  Resp: '18 17 18 16  ' Height: '5\' 2"'  (1.575 m)     Weight: 95.255 kg (210 lb)     SpO2: 100% 99% 100% 98%      Constitutional: NAD, calm, comfortable Filed Vitals:   05/27/15 0856 05/27/15 1247 05/27/15 1547 05/27/15 1845  BP: 128/89 130/80 106/64 108/77  Pulse: 91 92 72 86  Temp: 97.7 F (36.5 C) 97.9 F (36.6 C)  98.4 F (36.9 C)  TempSrc: Oral Oral  Oral  Resp: '18 17 18 16  ' Height: '5\' 2"'  (1.575 m)     Weight: 95.255 kg (210 lb)     SpO2: 100% 99% 100% 98%   Eyes: PERRL, lids and conjunctivae norma. Pain with lateral gaze in either direction and both eyes ENMT: Mucous membranes are moist. Posterior pharynx clear of any exudate or lesions.Normal dentition.  Neck: normal, supple, no masses, no thyromegaly Respiratory: clear to auscultation bilaterally, no wheezing, no crackles. Normal respiratory effort. No accessory muscle use.  Cardiovascular: Regular rate and rhythm, no murmurs / rubs / gallops. No extremity edema. 2+ pedal pulses. No carotid bruits.  Abdomen: no tenderness, no masses palpated. No hepatosplenomegaly. Bowel sounds positive.  Musculoskeletal: no clubbing / cyanosis. No joint deformity upper and lower extremities. Tenderness to palpation along the cervical and thoracic spine. Good ROM, no contractures. Normal muscle tone.  Skin: Right inner aspect of elbow lesions, ulcers. No induration Neurologic: CN 2-12 grossly intact. Sensation intact, DTR normal. Strength 5/5 in all 4.  Psychiatric: Normal judgment and insight. Alert and oriented x 3. Normal mood.     Labs on Admission: I have personally reviewed following labs and imaging  studies  CBC:  Recent Labs Lab 05/27/15 1013  WBC 17.5*  NEUTROABS 11.7*  HGB 17.1*  HCT 49.7*  MCV 85.5  PLT 158   Basic Metabolic Panel:  Recent Labs Lab 05/27/15 1013  NA 139  K 4.1  CL 99*  CO2 31  GLUCOSE 89  BUN 15  CREATININE 0.94  CALCIUM 9.1   GFR: Estimated Creatinine Clearance: 96.8 mL/min (by C-G formula based on Cr of 0.94). Liver Function Tests:  Recent Labs Lab 05/27/15 1013  AST 13*  ALT 16  ALKPHOS 71  BILITOT 0.7  PROT 6.8  ALBUMIN 3.7   No results for input(s): LIPASE, AMYLASE in the last 168 hours. No results for input(s): AMMONIA in the last 168 hours. Coagulation Profile: No results for input(s): INR, PROTIME in the last 168 hours. Cardiac Enzymes: No results for input(s): CKTOTAL, CKMB, CKMBINDEX, TROPONINI in the last 168 hours. BNP (last 3 results) No results for input(s): PROBNP in the last 8760 hours. HbA1C: No results for input(s): HGBA1C in the  last 72 hours. CBG: No results for input(s): GLUCAP in the last 168 hours. Lipid Profile: No results for input(s): CHOL, HDL, LDLCALC, TRIG, CHOLHDL, LDLDIRECT in the last 72 hours. Thyroid Function Tests: No results for input(s): TSH, T4TOTAL, FREET4, T3FREE, THYROIDAB in the last 72 hours. Anemia Panel: No results for input(s): VITAMINB12, FOLATE, FERRITIN, TIBC, IRON, RETICCTPCT in the last 72 hours. Urine analysis:    Component Value Date/Time   COLORURINE YELLOW 05/27/2015 1003   APPEARANCEUR CLEAR 05/27/2015 1003   LABSPEC 1.015 05/27/2015 1003   PHURINE 6.5 05/27/2015 1003   GLUCOSEU NEGATIVE 05/27/2015 1003   HGBUR NEGATIVE 05/27/2015 1003   [place] 05/27/2015 1003   [place] 05/27/2015 1003   PROTEINUR NEGATIVE 05/27/2015 1003   PROTEINUR neg 03/18/2014 1120   UROBILINOGEN 0.2 10/04/2014 1451   NITRITE NEGATIVE 05/27/2015 1003   NITRITE neg 03/18/2014 1120   LEUKOCYTESUR NEGATIVE 05/27/2015 1003   Sepsis  Labs: '@[email or web address]' (procalcitonin:4,lacticidven:4) ) Recent Results (from the past 240 hour(s))  CSF culture     Status: None (Preliminary result)   Collection Time: 05/27/15  3:08 PM  Result Value Ref Range Status   Specimen Description CSF  Final   Special Requests NONE  Final   Gram Stain   Final    CYTOSPIN SMEAR WBC PRESENT, PREDOMINANTLY MONONUCLEAR FUSIFORM NO ORGANISMS SEEN IN PAIRS Performed at [facility]    Culture PENDING  Incomplete   Report Status PENDING  Incomplete     Radiological Exams on Admission: Ct Head Wo Contrast  05/27/2015  CLINICAL DATA:  Left seen for spider bite generalize weakness. Blurry vision. EXAM: CT HEAD WITHOUT CONTRAST TECHNIQUE: Contiguous axial images were obtained from the base of the skull through the vertex without intravenous contrast. COMPARISON:  None. FINDINGS: No acute cortical infarct, hemorrhage, or mass lesion ispresent. Ventricles are of normal size. No significant extra-axial fluid collection is present. The paranasal sinuses andmastoid air cells are clear. The osseous skull is intact. IMPRESSION: Normal brain. Electronically Signed   By: Kerby Moors M.D.   On: 05/27/2015 13:47   Dg Lumbar Puncture Fluoro Guide  05/27/2015  CLINICAL DATA:  Neck pain and stiffness question meningitis EXAM: DIAGNOSTIC LUMBAR PUNCTURE UNDER FLUOROSCOPIC GUIDANCE FLUOROSCOPY TIME:  Radiation Exposure Index (as provided by the fluoroscopic device): Not provided If the device does not provide the exposure index: Fluoroscopy Time (in minutes and seconds):  0 minutes 24 seconds Number of Acquired Images:  2 PROCEDURE: Procedure, benefits, and risks were discussed with the patient, including alternatives. Patient's questions were answered. Written informed consent was obtained. Timeout protocol followed. Patient placed prone. L4-L5 disc space was localized under fluoroscopy. Skin prepped and draped in usual sterile fashion. Skin and soft tissues anesthetized  with 3 mL of 1% lidocaine. 22 gauge needle was advanced into the spinal canal where clear colorless CSF was encountered. Opening pressure was not assessed. 9 mL of CSF was obtained in 4 tubes for requested analysis. Procedure tolerated very well by patient without immediate complication. IMPRESSION: Successful lumbar puncture as above. Electronically Signed   By: Lavonia Dana M.D.   On: 05/27/2015 15:35      Assessment/Plan Meningitis like reaction with Back& neck stiffness and visual changes: Patient reporting ascending back and neck rigidity/stiffness after recent insect bite. - Admit to MedSurg - Check RPR, CRP, ESR, HIV - Neuro checks every 4 hours 4 - Follow-up C&S cultures/studies - Empirically placed on Rocephin IV - Infectious disease to see in a.m.  Leukocytosis: Acute. WBC elevated at 17.5. Patient afebrile with vital signs stable. Question if symptoms secondary to recent steroids versus underlying infection - Recheck in CBC a.m.  Erythrocytosis: Acute. Hemoglobin noted to be elevated at 17.1. Previous values all within normal limits. - Recheck CBC in a.m. Question need for further investigation   DVT prophylaxis: SCDs start DVT prophylaxis in a.m. Code Status: Full Family Communication: discussed plan with mom and family Disposition Plan: Possible discharge home in 1-2 days  Consults called: Infectious disease Admission status: Observation MedSurg  Norval Morton MD [facility] Pager [phone]  If 7PM-7AM, please contact night-coverage [email or web address] Password [facility]  05/27/2015, 6:51 PM

## 2015-05-27 NOTE — Procedures (Signed)
Preprocedure Dx: Neck pain and stiffness question meningitis Postprocedure Dx: Neck pain and stiffness question meningitis Procedure:  Fluoroscopically guided lumbar puncture Radiologist:  Thornton Papas Anesthesia:  3 ml of 1% lidocaine Specimen:  9 ml CSF, clear colorless EBL:   < 1 ml Complications: None

## 2015-05-27 NOTE — ED Notes (Signed)
Patient ambulatory to bathroom at this time

## 2015-05-27 NOTE — ED Notes (Signed)
Consents obtained for Lumbar Puncture.

## 2015-05-28 DIAGNOSIS — M2560 Stiffness of unspecified joint, not elsewhere classified: Secondary | ICD-10-CM

## 2015-05-28 DIAGNOSIS — M2569 Stiffness of other specified joint, not elsewhere classified: Secondary | ICD-10-CM | POA: Diagnosis present

## 2015-05-28 DIAGNOSIS — M436 Torticollis: Secondary | ICD-10-CM | POA: Diagnosis present

## 2015-05-28 DIAGNOSIS — D72829 Elevated white blood cell count, unspecified: Secondary | ICD-10-CM | POA: Diagnosis present

## 2015-05-28 DIAGNOSIS — H539 Unspecified visual disturbance: Secondary | ICD-10-CM

## 2015-05-28 DIAGNOSIS — D751 Secondary polycythemia: Secondary | ICD-10-CM | POA: Diagnosis present

## 2015-05-28 LAB — COMPREHENSIVE METABOLIC PANEL
ALBUMIN: 2.7 g/dL — AB (ref 3.5–5.0)
ALT: 14 U/L (ref 14–54)
ANION GAP: 6 (ref 5–15)
AST: 12 U/L — ABNORMAL LOW (ref 15–41)
Alkaline Phosphatase: 55 U/L (ref 38–126)
BILIRUBIN TOTAL: 0.3 mg/dL (ref 0.3–1.2)
BUN: 17 mg/dL (ref 6–20)
CHLORIDE: 106 mmol/L (ref 101–111)
CO2: 28 mmol/L (ref 22–32)
Calcium: 8.2 mg/dL — ABNORMAL LOW (ref 8.9–10.3)
Creatinine, Ser: 1 mg/dL (ref 0.44–1.00)
GFR calc Af Amer: >60 mL/min (ref >60)
GLUCOSE: 123 mg/dL — AB (ref 65–99)
POTASSIUM: 4 mmol/L (ref 3.5–5.1)
Sodium: 140 mmol/L (ref 135–145)
Total Protein: 5.2 g/dL — ABNORMAL LOW (ref 6.5–8.1)

## 2015-05-28 LAB — B. BURGDORFI ANTIBODIES: B burgdorferi Ab IgG+IgM: <0.91 {ISR} (ref 0.00–0.90)

## 2015-05-28 LAB — CBC
HEMATOCRIT: 41.8 % (ref 36.0–46.0)
HEMOGLOBIN: 13.9 g/dL (ref 12.0–15.0)
MCH: 29.1 pg (ref 26.0–34.0)
MCHC: 33.3 g/dL (ref 30.0–36.0)
MCV: 87.6 fL (ref 78.0–100.0)
Platelets: 204 10*3/uL (ref 150–400)
RBC: 4.77 MIL/uL (ref 3.87–5.11)
RDW: 13.3 % (ref 11.5–15.5)
WBC: 13.6 10*3/uL — AB (ref 4.0–10.5)

## 2015-05-28 LAB — C-REACTIVE PROTEIN: CRP: 0.7 mg/dL (ref <1.0)

## 2015-05-28 LAB — ROCKY MTN SPOTTED FVR ABS PNL(IGG+IGM)
RMSF IGG: NEGATIVE
RMSF IGM: 0.65 {index} (ref 0.00–0.89)

## 2015-05-28 MED ORDER — DOXYCYCLINE HYCLATE 100 MG PO TBEC: 100.0000 mg | DELAYED_RELEASE_TABLET | Freq: Two times a day (BID) | ORAL | Status: DC

## 2015-05-28 MED ORDER — DEXTROSE 5 % IV SOLN
INTRAVENOUS | Status: AC
  Filled 2015-05-28: qty 2

## 2015-05-28 NOTE — Discharge Summary (Signed)
Physician Discharge Summary  Urania Burdine [id number] DOB: 08-Dec-1985 DOA: 05/27/2015  PCP: Collene Mares, PA-C  Admit date: 05/27/2015 Discharge date: 05/28/2015  Time spent: 45 minutes  Recommendations for Outpatient Follow-up:  -Will be discharged home today. -Advised to follow-up with primary care provider in 2 weeks.   Discharge Diagnoses:  Principal Problem:   Meningitis like reaction Active Problems:   Erythrocytosis   Back stiffness   Neck stiffness   Vision changes   Leukocytosis   Discharge Condition: Stable and improved  Filed Weights   05/27/15 0856 05/27/15 2208  Weight: 95.255 kg (210 lb) 95.255 kg (210 lb)    History of present illness:  As per Dr. Tamala Julian on 5/3: [name] is a 30 y.o. female with a relatively unremarkable past medical history; who presents with complaints of back and neck pain that has progressively worsened over the last 2 days. Apparently 12 days ago she had been laying in mulch and possibly gotten bitten by an insect that she noticed the subsequent day to the right medial aspect of the elbow. She initially thought nothing of it, but developed a red rash that was also increasing in size. 8 days ago, she went to the emergency department and was evaluated for the rash. She was given prescriptions for doxycycline and prednisone. The following day she started taking the prescription as prescribed. Over this past weekend her and her family went camping and she completed her antibiotics of doxycycline 2 days ago(5/1). Since that time she's had this progressively ascending soreness that started in the middle of her back and goes up into her neck. Symptoms radiate across both shoulders. Symptoms worsened by movement and touch. Associated symptoms of hot flashes, diaphoresis, blurry vision when looking laterally with eye pain. Patient tried nothing prior to arrival denies any vomiting, change in appetite, lightheadedness, loss of consciousness,  mentation change, focal weakness, fever, chest pain, or palpitations.   ED Course: Upon admission to the emergency department patient was seen have normal vital signs. Lab work revealed WBC of 17.5, hemoglobin of 17.1, platelets of 264. CMP was relatively unremarkable. Urinalysis was negative. A lumbar puncture was obtained which showed glucose 58, total protein 24, and red blood cell count 22. Infectious disease was consulted and recommended placing patient on cefepime and they will see the patient in the morning.    Hospital Course:   Neck/back stiffness -Given symptoms, I suspect this is mostly musculoskeletal in origin given pain with palpation and with movement of head. -Advised to use topical measures such as heat, massage as well as NSAIDs. -Unlikely to be meningitis especially with results of lumbar puncture: Glucose 58, protein 24, RBCs 22, WBCs 1, Gram stain without organisms. -Discussed via telephone with infectious diseases Dr. Drucilla Schmidt. Given her recent tick bite will give another week of doxycycline empirically to complete a total of 2 weeks of treatment.   Procedures:  None   Consultations:  None  Discharge Instructions  Discharge Instructions    Increase activity slowly    Complete by:  As directed             Medication List    STOP taking these medications        dexamethasone 4 MG tablet  Commonly known as:  DECADRON     doxycycline 100 MG capsule  Commonly known as:  VIBRAMYCIN  Replaced by:  doxycycline 100 MG EC tablet      TAKE these medications  doxycycline 100 MG EC tablet  Commonly known as:  DORYX  Take 1 tablet (100 mg total) by mouth 2 (two) times daily.       Allergies  Allergen Reactions  . Codeine Hives and Itching       Follow-up Information    Follow up with MANN, BENJAMIN, PA-C. Schedule an appointment as soon as possible for a visit in 2 weeks.   Specialties:  Physician Assistant, Internal Medicine   Contact  information:   [address] [place] [id number] [phone]        The results of significant diagnostics from this hospitalization (including imaging, microbiology, ancillary and laboratory) are listed below for reference.    Significant Diagnostic Studies: Ct Head Wo Contrast  05/27/2015  CLINICAL DATA:  Left seen for spider bite generalize weakness. Blurry vision. EXAM: CT HEAD WITHOUT CONTRAST TECHNIQUE: Contiguous axial images were obtained from the base of the skull through the vertex without intravenous contrast. COMPARISON:  None. FINDINGS: No acute cortical infarct, hemorrhage, or mass lesion ispresent. Ventricles are of normal size. No significant extra-axial fluid collection is present. The paranasal sinuses andmastoid air cells are clear. The osseous skull is intact. IMPRESSION: Normal brain. Electronically Signed   By: Kerby Moors M.D.   On: 05/27/2015 13:47   Dg Lumbar Puncture Fluoro Guide  05/27/2015  CLINICAL DATA:  Neck pain and stiffness question meningitis EXAM: DIAGNOSTIC LUMBAR PUNCTURE UNDER FLUOROSCOPIC GUIDANCE FLUOROSCOPY TIME:  Radiation Exposure Index (as provided by the fluoroscopic device): Not provided If the device does not provide the exposure index: Fluoroscopy Time (in minutes and seconds):  0 minutes 24 seconds Number of Acquired Images:  2 PROCEDURE: Procedure, benefits, and risks were discussed with the patient, including alternatives. Patient's questions were answered. Written informed consent was obtained. Timeout protocol followed. Patient placed prone. L4-L5 disc space was localized under fluoroscopy. Skin prepped and draped in usual sterile fashion. Skin and soft tissues anesthetized with 3 mL of 1% lidocaine. 22 gauge needle was advanced into the spinal canal where clear colorless CSF was encountered. Opening pressure was not assessed. 9 mL of CSF was obtained in 4 tubes for requested analysis. Procedure tolerated very well by patient without  immediate complication. IMPRESSION: Successful lumbar puncture as above. Electronically Signed   By: Lavonia Dana M.D.   On: 05/27/2015 15:35    Microbiology: Recent Results (from the past 240 hour(s))  CSF culture     Status: None (Preliminary result)   Collection Time: 05/27/15  3:08 PM  Result Value Ref Range Status   Specimen Description CSF  Final   Special Requests NONE  Final   Gram Stain   Final    CYTOSPIN SMEAR WBC PRESENT, PREDOMINANTLY MONONUCLEAR FUSIFORM NO ORGANISMS SEEN IN PAIRS CORRECTED RESULTS WBC PRESENT, PREDOMINANTLY MONONUCLEAR NO ORGANISMS SEEN CYTOSPIN SMEAR CORRECTED RESULTS CALLED TO: L. DAVIS,RN AT ID:[id number] ON UG:[id number] BY Rhea Bleacher    Culture   Final    NO GROWTH < 12 HOURS Performed at [facility]    Report Status PENDING  Incomplete     Labs: Basic Metabolic Panel:  Recent Labs Lab 05/27/15 1013 05/28/15 0620  NA 139 140  K 4.1 4.0  CL 99* 106  CO2 31 28  GLUCOSE 89 123*  BUN 15 17  CREATININE 0.94 1.00  CALCIUM 9.1 8.2*   Liver Function Tests:  Recent Labs Lab 05/27/15 1013 05/28/15 0620  AST 13* 12*  ALT 16 14  ALKPHOS 71 55  BILITOT 0.7 0.3  PROT 6.8 5.2*  ALBUMIN 3.7 2.7*   No results for input(s): LIPASE, AMYLASE in the last 168 hours. No results for input(s): AMMONIA in the last 168 hours. CBC:  Recent Labs Lab 05/27/15 1013 05/28/15 0620  WBC 17.5* 13.6*  NEUTROABS 11.7*  --   HGB 17.1* 13.9  HCT 49.7* 41.8  MCV 85.5 87.6  PLT 264 204   Cardiac Enzymes: No results for input(s): CKTOTAL, CKMB, CKMBINDEX, TROPONINI in the last 168 hours. BNP: BNP (last 3 results) No results for input(s): BNP in the last 8760 hours.  ProBNP (last 3 results) No results for input(s): PROBNP in the last 8760 hours.  CBG: No results for input(s): GLUCAP in the last 168 hours.     SignedLelon Frohlich  [facility] Pager: [phone] 05/28/2015, 3:05 PM

## 2015-05-28 NOTE — Care Management Note (Signed)
Case Management Note  Patient Details  Name: [name] MRN: [id number] Date of Birth: 01-04-1986  Subjective/Objective:                  Pt is from home, lives with family and is ind with ADL's. Pt is uninsured, has transportation and PCP at [facility]. Pt will be DC'd on abx, pt can afford cost abx OOP. Goodrx coupon given.   Action/Plan: Pt discharged home today, no CM needs.   Expected Discharge Date:     05/28/2015             Expected Discharge Plan:  Home/Self Care  In-House Referral:  NA  Discharge planning Services  CM Consult  Post Acute Care Choice:  NA Choice offered to:  NA  DME Arranged:    DME Agency:     HH Arranged:    HH Agency:     Status of Service:  In process, will continue to follow  Medicare Important Message Given:    Date Medicare IM Given:    Medicare IM give by:    Date Additional Medicare IM Given:    Additional Medicare Important Message give by:     If discussed at [place] of Stay Meetings, dates discussed:    Additional Comments:  Sherald Barge, RN 05/28/2015, 1:44 PM

## 2015-05-29 ENCOUNTER — Observation Stay (HOSPITAL_COMMUNITY): Payer: Self-pay

## 2015-05-29 ENCOUNTER — Encounter (HOSPITAL_COMMUNITY): Payer: Self-pay | Admitting: *Deleted

## 2015-05-29 ENCOUNTER — Inpatient Hospital Stay (HOSPITAL_COMMUNITY)
Admission: EM | Admit: 2015-05-29 | Discharge: 2015-05-31 | DRG: 872 | Disposition: A | Discharge status: Home / Self Care | Payer: Self-pay | Attending: Internal Medicine | Admitting: Internal Medicine

## 2015-05-29 DIAGNOSIS — R21 Rash and other nonspecific skin eruption: Secondary | ICD-10-CM | POA: Diagnosis present

## 2015-05-29 DIAGNOSIS — M2560 Stiffness of unspecified joint, not elsewhere classified: Secondary | ICD-10-CM

## 2015-05-29 DIAGNOSIS — R519 Headache, unspecified: Secondary | ICD-10-CM

## 2015-05-29 DIAGNOSIS — D72829 Elevated white blood cell count, unspecified: Secondary | ICD-10-CM | POA: Diagnosis present

## 2015-05-29 DIAGNOSIS — A419 Sepsis, unspecified organism: Secondary | ICD-10-CM | POA: Diagnosis present

## 2015-05-29 DIAGNOSIS — R1110 Vomiting, unspecified: Secondary | ICD-10-CM

## 2015-05-29 DIAGNOSIS — B952 Enterococcus as the cause of diseases classified elsewhere: Secondary | ICD-10-CM | POA: Diagnosis present

## 2015-05-29 DIAGNOSIS — B9789 Other viral agents as the cause of diseases classified elsewhere: Secondary | ICD-10-CM | POA: Diagnosis present

## 2015-05-29 DIAGNOSIS — A4189 Other specified sepsis: Principal | ICD-10-CM | POA: Diagnosis present

## 2015-05-29 DIAGNOSIS — R112 Nausea with vomiting, unspecified: Secondary | ICD-10-CM | POA: Diagnosis present

## 2015-05-29 DIAGNOSIS — S50361A Insect bite (nonvenomous) of right elbow, initial encounter: Secondary | ICD-10-CM | POA: Diagnosis present

## 2015-05-29 DIAGNOSIS — R197 Diarrhea, unspecified: Secondary | ICD-10-CM | POA: Diagnosis present

## 2015-05-29 DIAGNOSIS — M2569 Stiffness of other specified joint, not elsewhere classified: Secondary | ICD-10-CM

## 2015-05-29 DIAGNOSIS — R51 Headache: Secondary | ICD-10-CM | POA: Diagnosis present

## 2015-05-29 DIAGNOSIS — W57XXXA Bitten or stung by nonvenomous insect and other nonvenomous arthropods, initial encounter: Secondary | ICD-10-CM | POA: Diagnosis present

## 2015-05-29 LAB — URINALYSIS, ROUTINE W REFLEX MICROSCOPIC
Bilirubin Urine: NEGATIVE
Glucose, UA: NEGATIVE mg/dL
HGB URINE DIPSTICK: NEGATIVE
KETONES UR: NEGATIVE mg/dL
LEUKOCYTES UA: NEGATIVE
Nitrite: NEGATIVE
Protein, ur: NEGATIVE mg/dL
SPECIFIC GRAVITY, URINE: 1.007 (ref 1.005–1.030)
pH: 7 (ref 5.0–8.0)

## 2015-05-29 LAB — CBC WITH DIFFERENTIAL/PLATELET
BASOS PCT: 0 %
Basophils Absolute: 0 10*3/uL (ref 0.0–0.1)
EOS ABS: 0 10*3/uL (ref 0.0–0.7)
Eosinophils Relative: 0 %
HCT: 47.3 % — ABNORMAL HIGH (ref 36.0–46.0)
HEMOGLOBIN: 15.9 g/dL — AB (ref 12.0–15.0)
Lymphocytes Relative: 3 %
Lymphs Abs: 0.6 10*3/uL — ABNORMAL LOW (ref 0.7–4.0)
MCH: 29.7 pg (ref 26.0–34.0)
MCHC: 33.6 g/dL (ref 30.0–36.0)
MCV: 88.2 fL (ref 78.0–100.0)
Monocytes Absolute: 0.9 10*3/uL (ref 0.1–1.0)
Monocytes Relative: 4 %
NEUTROS PCT: 93 %
Neutro Abs: 20.5 10*3/uL — ABNORMAL HIGH (ref 1.7–7.7)
PLATELETS: 218 10*3/uL (ref 150–400)
RBC: 5.36 MIL/uL — AB (ref 3.87–5.11)
RDW: 13 % (ref 11.5–15.5)
WBC: 22 10*3/uL — AB (ref 4.0–10.5)

## 2015-05-29 LAB — INFLUENZA PANEL BY PCR (TYPE A & B)
H1N1 flu by pcr: NOT DETECTED
INFLAPCR: NEGATIVE
Influenza B By PCR: NEGATIVE

## 2015-05-29 LAB — COMPREHENSIVE METABOLIC PANEL
ALT: 25 U/L (ref 14–54)
ANION GAP: 12 (ref 5–15)
AST: 19 U/L (ref 15–41)
Albumin: 3.6 g/dL (ref 3.5–5.0)
Alkaline Phosphatase: 68 U/L (ref 38–126)
BUN: 10 mg/dL (ref 6–20)
CALCIUM: 9.3 mg/dL (ref 8.9–10.3)
CHLORIDE: 102 mmol/L (ref 101–111)
CO2: 24 mmol/L (ref 22–32)
Creatinine, Ser: 0.87 mg/dL (ref 0.44–1.00)
Glucose, Bld: 95 mg/dL (ref 65–99)
Potassium: 4 mmol/L (ref 3.5–5.1)
SODIUM: 138 mmol/L (ref 135–145)
Total Bilirubin: 0.7 mg/dL (ref 0.3–1.2)
Total Protein: 6.7 g/dL (ref 6.5–8.1)

## 2015-05-29 LAB — CBC
HCT: 49.2 % — ABNORMAL HIGH (ref 36.0–46.0)
HEMOGLOBIN: 16.9 g/dL — AB (ref 12.0–15.0)
MCH: 30 pg (ref 26.0–34.0)
MCHC: 34.3 g/dL (ref 30.0–36.0)
MCV: 87.2 fL (ref 78.0–100.0)
PLATELETS: 266 10*3/uL (ref 150–400)
RBC: 5.64 MIL/uL — AB (ref 3.87–5.11)
RDW: 13.3 % (ref 11.5–15.5)
WBC: 24.8 10*3/uL — AB (ref 4.0–10.5)

## 2015-05-29 LAB — RPR: RPR: NONREACTIVE

## 2015-05-29 LAB — I-STAT CG4 LACTIC ACID, ED: LACTIC ACID, VENOUS: 0.91 mmol/L (ref 0.5–2.0)

## 2015-05-29 LAB — LIPASE, BLOOD: Lipase: 30 U/L (ref 11–51)

## 2015-05-29 MED ORDER — SODIUM CHLORIDE 0.9% FLUSH
3.0000 mL | Freq: Two times a day (BID) | INTRAVENOUS | Status: DC
  Administered 2015-05-30: 3 mL via INTRAVENOUS

## 2015-05-29 MED ORDER — CYCLOBENZAPRINE HCL 10 MG PO TABS: 10.0000 mg | ORAL_TABLET | Freq: Two times a day (BID) | ORAL | Status: DC | PRN

## 2015-05-29 MED ORDER — HYDROMORPHONE HCL 1 MG/ML IJ SOLN: 1.0000 mg | INTRAMUSCULAR | Status: DC | PRN

## 2015-05-29 MED ORDER — HYDROMORPHONE HCL 1 MG/ML IJ SOLN
0.5000 mg | Freq: Once | INTRAMUSCULAR | Status: AC
Start: 2015-05-29 — End: 2015-05-29
  Administered 2015-05-29: 0.5 mg via INTRAVENOUS
  Filled 2015-05-29: qty 1

## 2015-05-29 MED ORDER — DIPHENHYDRAMINE HCL 50 MG/ML IJ SOLN
25.0000 mg | Freq: Once | INTRAMUSCULAR | Status: AC
  Administered 2015-05-29: 25 mg via INTRAVENOUS
  Filled 2015-05-29: qty 1

## 2015-05-29 MED ORDER — LACTATED RINGERS IV SOLN
INTRAVENOUS | Status: AC
  Administered 2015-05-30: 01:00:00 via INTRAVENOUS

## 2015-05-29 MED ORDER — IBUPROFEN 200 MG PO TABS: 200.0000 mg | ORAL_TABLET | Freq: Four times a day (QID) | ORAL | Status: DC | PRN

## 2015-05-29 MED ORDER — LACTATED RINGERS IV SOLN
INTRAVENOUS | Status: DC
  Administered 2015-05-29: 21:00:00 via INTRAVENOUS

## 2015-05-29 MED ORDER — ONDANSETRON HCL 4 MG/2ML IJ SOLN
4.0000 mg | Freq: Once | INTRAMUSCULAR | Status: AC
  Administered 2015-05-29: 4 mg via INTRAVENOUS
  Filled 2015-05-29: qty 2

## 2015-05-29 MED ORDER — ONDANSETRON HCL 4 MG/2ML IJ SOLN
4.0000 mg | Freq: Three times a day (TID) | INTRAMUSCULAR | Status: DC | PRN
  Administered 2015-05-30 (×2): 4 mg via INTRAVENOUS
  Filled 2015-05-29 (×2): qty 2

## 2015-05-29 MED ORDER — METHOCARBAMOL 1000 MG/10ML IJ SOLN
500.0000 mg | Freq: Once | INTRAMUSCULAR | Status: AC
Start: 2015-05-29 — End: 2015-05-29
  Administered 2015-05-29: 500 mg via INTRAMUSCULAR
  Filled 2015-05-29: qty 5

## 2015-05-29 MED ORDER — SODIUM CHLORIDE 0.9 % IV SOLN
INTRAVENOUS | Status: DC
  Administered 2015-05-30 – 2015-05-31 (×2): via INTRAVENOUS

## 2015-05-29 MED ORDER — DOXYCYCLINE HYCLATE 100 MG IV SOLR
200.0000 mg | Freq: Two times a day (BID) | INTRAVENOUS | Status: DC
  Administered 2015-05-30: 200 mg via INTRAVENOUS
  Filled 2015-05-29 (×2): qty 200

## 2015-05-29 MED ORDER — GUAIFENESIN ER 600 MG PO TB12
600.0000 mg | ORAL_TABLET | Freq: Two times a day (BID) | ORAL | Status: DC
  Administered 2015-05-30 – 2015-05-31 (×4): 600 mg via ORAL
  Filled 2015-05-29 (×4): qty 1

## 2015-05-29 MED ORDER — SODIUM CHLORIDE 0.9 % IV BOLUS (SEPSIS)
1000.0000 mL | Freq: Once | INTRAVENOUS | Status: AC
  Administered 2015-05-29: 1000 mL via INTRAVENOUS

## 2015-05-29 MED ORDER — ONDANSETRON 4 MG PO TBDP
ORAL_TABLET | ORAL | Status: AC
  Filled 2015-05-29: qty 1

## 2015-05-29 MED ORDER — PROCHLORPERAZINE EDISYLATE 5 MG/ML IJ SOLN
5.0000 mg | Freq: Once | INTRAMUSCULAR | Status: AC
  Administered 2015-05-29: 5 mg via INTRAMUSCULAR
  Filled 2015-05-29: qty 2

## 2015-05-29 MED ORDER — ONDANSETRON HCL 4 MG PO TABS: 4.0000 mg | ORAL_TABLET | Freq: Three times a day (TID) | ORAL | Status: DC | PRN

## 2015-05-29 MED ORDER — ACETAMINOPHEN 325 MG PO TABS: 650.0000 mg | ORAL_TABLET | Freq: Four times a day (QID) | ORAL | Status: DC | PRN

## 2015-05-29 MED ORDER — SODIUM CHLORIDE 0.9 % IV BOLUS (SEPSIS)
1000.0000 mL | Freq: Once | INTRAVENOUS | Status: AC
  Administered 2015-05-30: 1000 mL via INTRAVENOUS

## 2015-05-29 MED ORDER — HYDROCODONE-ACETAMINOPHEN 5-325 MG PO TABS
1.0000 | ORAL_TABLET | ORAL | Status: DC | PRN
  Administered 2015-05-30 – 2015-05-31 (×6): 2 via ORAL
  Filled 2015-05-29 (×6): qty 2

## 2015-05-29 MED ORDER — ACETAMINOPHEN 650 MG RE SUPP: 650.0000 mg | Freq: Four times a day (QID) | RECTAL | Status: DC | PRN

## 2015-05-29 MED ORDER — ONDANSETRON 4 MG PO TBDP
4.0000 mg | ORAL_TABLET | Freq: Once | ORAL | Status: AC | PRN
  Administered 2015-05-29: 4 mg via ORAL

## 2015-05-29 MED ORDER — NAPROXEN 500 MG PO TABS: 500.0000 mg | ORAL_TABLET | Freq: Two times a day (BID) | ORAL | Status: DC

## 2015-05-29 MED ORDER — ONDANSETRON HCL 4 MG/2ML IJ SOLN: 4.0000 mg | Freq: Three times a day (TID) | INTRAMUSCULAR | Status: DC | PRN

## 2015-05-29 NOTE — ED Notes (Signed)
Pt was dc from [facility] yesterday for headache, neck stiffness, n/v/d and sensitivity to light. Had full workup including LP to r/o meningitis. But pt presents today with still having severe headache and n/v.

## 2015-05-29 NOTE — Discharge Instructions (Signed)
General Headache Without Cause A headache is pain or discomfort felt around the head or neck area. The specific cause of a headache may not be found. There are many causes and types of headaches. A few common ones are:  Tension headaches.  Migraine headaches.  Cluster headaches.  Chronic daily headaches. HOME CARE INSTRUCTIONS  Watch your condition for any changes. Take these steps to help with your condition: Managing Pain  Take over-the-counter and prescription medicines only as told by your health care provider.  Lie down in a dark, quiet room when you have a headache.  If directed, apply ice to the head and neck area:  Put ice in a plastic bag.  Place a towel between your skin and the bag.  Leave the ice on for 20 minutes, 2-3 times per day.  Use a heating pad or hot shower to apply heat to the head and neck area as told by your health care provider.  Keep lights dim if bright lights bother you or make your headaches worse. Eating and Drinking  Eat meals on a regular schedule.  Limit alcohol use.  Decrease the amount of caffeine you drink, or stop drinking caffeine. General Instructions  Keep all follow-up visits as told by your health care provider. This is important.  Keep a headache journal to help find out what may trigger your headaches. For example, write down:  What you eat and drink.  How much sleep you get.  Any change to your diet or medicines.  Try massage or other relaxation techniques.  Limit stress.  Sit up straight, and do not tense your muscles.  Do not use tobacco products, including cigarettes, chewing tobacco, or e-cigarettes. If you need help quitting, ask your health care provider.  Exercise regularly as told by your health care provider.  Sleep on a regular schedule. Get 7-9 hours of sleep, or the amount recommended by your health care provider. SEEK MEDICAL CARE IF:   Your symptoms are not helped by medicine.  You have a  headache that is different from the usual headache.  You have nausea or you vomit.  You have a fever. SEEK IMMEDIATE MEDICAL CARE IF:   Your headache becomes severe.  You have repeated vomiting.  You have a stiff neck.  You have a loss of vision.  You have problems with speech.  You have pain in the eye or ear.  You have muscular weakness or loss of muscle control.  You lose your balance or have trouble walking.  You feel faint or pass out.  You have confusion.   This information is not intended to replace advice given to you by your health care provider. Make sure you discuss any questions you have with your health care provider.   Document Released: 01/10/2005 Document Revised: 10/01/2014 Document Reviewed: 05/05/2014 Elsevier Interactive Patient Education 2016 [place].  Lumbar Puncture A lumbar puncture, or spinal tap, is a procedure in which a small amount of the fluid that surrounds the brain and spinal cord is removed and examined. The fluid is called the cerebrospinal fluid. This procedure may be done to:   Help diagnose various problems, such as meningitis, encephalitis, multiple sclerosis, and AIDS.   Remove fluid and relieve pressure that occurs with certain types of headaches.   Look for bleeding within the brain and spinal cord areas (central nervous system).   Place medicine into the spinal fluid.  LET [facility] CARE PROVIDER KNOW ABOUT:  Any allergies you have.  All medicines you are taking, including vitamins, herbs, eye drops, creams, and over-the-counter medicines.  Previous problems you or members of your family have had with the use of anesthetics.  Any blood disorders you have.  Previous surgeries you have had.  Medical conditions you have. RISKS AND COMPLICATIONS Generally, this is a safe procedure. However, as with any procedure, complications can occur. Possible complications include:   Spinal headache. This is a severe  headache that occurs when there is a leak of spinal fluid. A spinal headache causes discomfort but is not dangerous. If it persists, another procedure may be done to treat the headache.  Bleeding. This most often occurs in people with bleeding disorders. These are disorders in which the blood does not clot normally.   Infection at the insertion site that can spread to the bone or spinal fluid.  Formation of a spinal cord tumor (rare).  Brain herniation or movement of the brain into the spinal cord (rare).  Inability to move (extremely rare). BEFORE THE PROCEDURE  You may have blood tests done. These tests can help tell how well your kidneys and liver are working. They can also show how well your blood clots.   If you take blood thinners (anticoagulant medicine), ask your health care provider if and when you should stop taking them.   Your health care provider may order a CT scan of your brain.  Make arrangements for someone to drive you home after the procedure.  PROCEDURE  You will be positioned so that the spaces between the bones of the spine (vertebrae) are as wide as possible. This will make it easier to pass the needle into the spinal canal.  Depending on your age and size, you may lie on your side, curled up with your knees under your chin. Or, you may sit with your head resting on a pillow that is placed at waist level.  The skin covering the lower back (or lumbar region) will be cleaned.   The skin may be numbed with medicine.  You may be given pain medicine or a medicine to help you relax (sedative).  A small needle will be inserted in the skin until it enters the space that contains the spinal fluid. The needle will not enter the spinal cord.   The spinal fluid will be collected into tubes.   The needle will be withdrawn, and a bandage will be placed on the site.  AFTER THE PROCEDURE  You will remain lying down for 1 hour or for as long as your health  care provider suggests.   The spinal fluid will be sent to a laboratory to be examined. The results of the examination may be available before you go home.  A test, called a culture, may be taken of the spinal fluid if your health care provider thinks you have an infection. If cultures were taken for exam, the results will usually be available in a couple of days.    This information is not intended to replace advice given to you by your health care provider. Make sure you discuss any questions you have with your health care provider.   Document Released: 01/08/2000 Document Revised: 10/31/2012 Document Reviewed: 09/17/2012 Elsevier Interactive Patient Education 2016 [employer]  Leukocytosis Leukocytosis means you have more white blood cells than normal. White blood cells are made in your bone marrow. The main job of white blood cells is to fight infection. Having too many white blood cells is a common condition.  It can develop as a result of many types of medical problems. CAUSES  In some cases, your bone marrow may be normal, but it is still making too many white blood cells. This could be the result of:  Infection.  Injury.  Physical stress.  Emotional stress.  Surgery.  Allergic reactions.  Tumors that do not start in the blood or bone marrow.  An inherited disease.  Certain medicines.  Pregnancy and labor. In other cases, you may have a bone marrow disorder that is causing your body to make too many white blood cells. Bone marrow disorders include:  Leukemia. This is a type of blood cancer.  Myeloproliferative disorders. These disorders cause blood cells to grow abnormally. SYMPTOMS  Some people have no symptoms. Others have symptoms due to the medical problem that is causing their leukocytosis. These symptoms may include:  Bleeding.  Bruising.  Fever.  Night sweats.  Repeated infections.  Weakness.  Weight loss. DIAGNOSIS  Leukocytosis is often  found during blood tests that are done as part of a normal physical exam. Your caregiver will probably order other tests to help determine why you have too many white blood cells. These tests may include:  A complete blood count (CBC). This test measures all the types of blood cells in your body.  Chest X-rays, urine tests (urinalysis), or other tests to look for signs of infection.  Bone marrow aspiration. For this test, a needle is put into your bone. Cells from the bone marrow are removed through the needle. The cells are then examined under a microscope. TREATMENT  Treatment is usually not needed for leukocytosis. However, if a disorder is causing your leukocytosis, it will need to be treated. Treatment may include:  Antibiotic medicines if you have a bacterial infection.  Bone marrow transplant. Your diseased bone marrow is replaced with healthy cells that will grow new bone marrow.  Chemotherapy. This is the use of drugs to kill cancer cells. HOME CARE INSTRUCTIONS  Only take over-the-counter or prescription medicines as directed by your caregiver.  Maintain a healthy weight. Ask your caregiver what weight is best for you.  Eat foods that are low in saturated fats and high in fiber. Eat plenty of fruits and vegetables.  Drink enough fluids to keep your urine clear or pale yellow.  Get 30 minutes of exercise at least 5 times a week. Check with your caregiver before starting a new exercise routine.  Limit caffeine and alcohol.  Do not smoke.  Keep all follow-up appointments as directed by your caregiver. SEEK MEDICAL CARE IF:  You feel weak or more tired than usual.  You develop chills, a cough, or nasal congestion.  You lose weight without trying.  You have night sweats.  You bruise easily. SEEK IMMEDIATE MEDICAL CARE IF:  You bleed more than normal.  You have chest pain.  You have trouble breathing.  You have a fever.  You have uncontrolled nausea or  vomiting.  You feel dizzy or lightheaded. MAKE SURE YOU:  Understand these instructions.  Will watch your condition.  Will get help right away if you are not doing well or get worse.   This information is not intended to replace advice given to you by your health care provider. Make sure you discuss any questions you have with your health care provider.   Document Released: 12/30/2010 Document Revised: 04/04/2011 Document Reviewed: 07/14/2014 Elsevier Interactive Patient Education 2016 Elsevier Inc.  Nausea and Vomiting Nausea is a sick feeling  that often comes before throwing up (vomiting). Vomiting is a reflex where stomach contents come out of your mouth. Vomiting can cause severe loss of body fluids (dehydration). Children and elderly adults can become dehydrated quickly, especially if they also have diarrhea. Nausea and vomiting are symptoms of a condition or disease. It is important to find the cause of your symptoms. CAUSES   Direct irritation of the stomach lining. This irritation can result from increased acid production (gastroesophageal reflux disease), infection, food poisoning, taking certain medicines (such as nonsteroidal anti-inflammatory drugs), alcohol use, or tobacco use.  Signals from the brain.These signals could be caused by a headache, heat exposure, an inner ear disturbance, increased pressure in the brain from injury, infection, a tumor, or a concussion, pain, emotional stimulus, or metabolic problems.  An obstruction in the gastrointestinal tract (bowel obstruction).  Illnesses such as diabetes, hepatitis, gallbladder problems, appendicitis, kidney problems, cancer, sepsis, atypical symptoms of a heart attack, or eating disorders.  Medical treatments such as chemotherapy and radiation.  Receiving medicine that makes you sleep (general anesthetic) during surgery. DIAGNOSIS Your caregiver may ask for tests to be done if the problems do not improve after a few  days. Tests may also be done if symptoms are severe or if the reason for the nausea and vomiting is not clear. Tests may include:  Urine tests.  Blood tests.  Stool tests.  Cultures (to look for evidence of infection).  X-rays or other imaging studies. Test results can help your caregiver make decisions about treatment or the need for additional tests. TREATMENT You need to stay well hydrated. Drink frequently but in small amounts.You may wish to drink water, sports drinks, clear broth, or eat frozen ice pops or gelatin dessert to help stay hydrated.When you eat, eating slowly may help prevent nausea.There are also some antinausea medicines that may help prevent nausea. HOME CARE INSTRUCTIONS   Take all medicine as directed by your caregiver.  If you do not have an appetite, do not force yourself to eat. However, you must continue to drink fluids.  If you have an appetite, eat a normal diet unless your caregiver tells you differently.  Eat a variety of complex carbohydrates (rice, wheat, potatoes, bread), lean meats, yogurt, fruits, and vegetables.  Avoid high-fat foods because they are more difficult to digest.  Drink enough water and fluids to keep your urine clear or pale yellow.  If you are dehydrated, ask your caregiver for specific rehydration instructions. Signs of dehydration may include:  Severe thirst.  Dry lips and mouth.  Dizziness.  Dark urine.  Decreasing urine frequency and amount.  Confusion.  Rapid breathing or pulse. SEEK IMMEDIATE MEDICAL CARE IF:   You have blood or brown flecks (like coffee grounds) in your vomit.  You have black or bloody stools.  You have a severe headache or stiff neck.  You are confused.  You have severe abdominal pain.  You have chest pain or trouble breathing.  You do not urinate at least once every 8 hours.  You develop cold or clammy skin.  You continue to vomit for longer than 24 to 48 hours.  You have a  fever. MAKE SURE YOU:   Understand these instructions.  Will watch your condition.  Will get help right away if you are not doing well or get worse.   This information is not intended to replace advice given to you by your health care provider. Make sure you discuss any questions you have with  your health care provider.   Document Released: 01/10/2005 Document Revised: 04/04/2011 Document Reviewed: 06/09/2010 Elsevier Interactive Patient Education 2016 [place].  Diarrhea Diarrhea is frequent loose and watery bowel movements. It can cause you to feel weak and dehydrated. Dehydration can cause you to become tired and thirsty, have a dry mouth, and have decreased urination that often is dark yellow. Diarrhea is a sign of another problem, most often an infection that will not last long. In most cases, diarrhea typically lasts 2-3 days. However, it can last longer if it is a sign of something more serious. It is important to treat your diarrhea as directed by your caregiver to lessen or prevent future episodes of diarrhea. CAUSES  Some common causes include:  Gastrointestinal infections caused by viruses, bacteria, or parasites.  Food poisoning or food allergies.  Certain medicines, such as antibiotics, chemotherapy, and laxatives.  Artificial sweeteners and fructose.  Digestive disorders. HOME CARE INSTRUCTIONS  Ensure adequate fluid intake (hydration): Have 1 cup (8 oz) of fluid for each diarrhea episode. Avoid fluids that contain simple sugars or sports drinks, fruit juices, whole milk products, and sodas. Your urine should be clear or pale yellow if you are drinking enough fluids. Hydrate with an oral rehydration solution that you can purchase at pharmacies, retail stores, and online. You can prepare an oral rehydration solution at home by mixing the following ingredients together:   - tsp table salt.   tsp baking soda.   tsp salt substitute containing potassium  chloride.  1  tablespoons sugar.  1 L (34 oz) of water.  Certain foods and beverages may increase the speed at which food moves through the gastrointestinal (GI) tract. These foods and beverages should be avoided and include:  Caffeinated and alcoholic beverages.  High-fiber foods, such as raw fruits and vegetables, nuts, seeds, and whole grain breads and cereals.  Foods and beverages sweetened with sugar alcohols, such as xylitol, sorbitol, and mannitol.  Some foods may be well tolerated and may help thicken stool including:  Starchy foods, such as rice, toast, pasta, low-sugar cereal, oatmeal, grits, baked potatoes, crackers, and bagels.  Bananas.  Applesauce.  Add probiotic-rich foods to help increase healthy bacteria in the GI tract, such as yogurt and fermented milk products.  Wash your hands well after each diarrhea episode.  Only take over-the-counter or prescription medicines as directed by your caregiver.  Take a warm bath to relieve any burning or pain from frequent diarrhea episodes. SEEK IMMEDIATE MEDICAL CARE IF:   You are unable to keep fluids down.  You have persistent vomiting.  You have blood in your stool, or your stools are black and tarry.  You do not urinate in 6-8 hours, or there is only a small amount of very dark urine.  You have abdominal pain that increases or localizes.  You have weakness, dizziness, confusion, or light-headedness.  You have a severe headache.  Your diarrhea gets worse or does not get better.  You have a fever or persistent symptoms for more than 2-3 days.  You have a fever and your symptoms suddenly get worse. MAKE SURE YOU:   Understand these instructions.  Will watch your condition.  Will get help right away if you are not doing well or get worse.   This information is not intended to replace advice given to you by your health care provider. Make sure you discuss any questions you have with your health care  provider.   Document Released: 12/31/2001 Document  Revised: 01/31/2014 Document Reviewed: 09/18/2011 Elsevier Interactive Patient Education 2016 [place] Choices to Help Relieve Diarrhea, Adult When you have diarrhea, the foods you eat and your eating habits are very important. Choosing the right foods and drinks can help relieve diarrhea. Also, because diarrhea can last up to 7 days, you need to replace lost fluids and electrolytes (such as sodium, potassium, and chloride) in order to help prevent dehydration.  WHAT GENERAL GUIDELINES DO I NEED TO FOLLOW?  Slowly drink 1 cup (8 oz) of fluid for each episode of diarrhea. If you are getting enough fluid, your urine will be clear or pale yellow.  Eat starchy foods. Some good choices include white rice, white toast, pasta, low-fiber cereal, baked potatoes (without the skin), saltine crackers, and bagels.  Avoid large servings of any cooked vegetables.  Limit fruit to two servings per day. A serving is  cup or 1 small piece.  Choose foods with less than 2 g of fiber per serving.  Limit fats to less than 8 tsp (38 g) per day.  Avoid fried foods.  Eat foods that have probiotics in them. Probiotics can be found in certain dairy products.  Avoid foods and beverages that may increase the speed at which food moves through the stomach and intestines (gastrointestinal tract). Things to avoid include:  High-fiber foods, such as dried fruit, raw fruits and vegetables, nuts, seeds, and whole grain foods.  Spicy foods and high-fat foods.  Foods and beverages sweetened with high-fructose corn syrup, honey, or sugar alcohols such as xylitol, sorbitol, and mannitol. WHAT FOODS ARE RECOMMENDED? Grains White rice. White, [place], or pita breads (fresh or toasted), including plain rolls, buns, or bagels. White pasta. Saltine, soda, or graham crackers. Pretzels. Low-fiber cereal. Cooked cereals made with water (such as cornmeal, farina, or  cream cereals). Plain muffins. Matzo. Melba toast. Zwieback.  Vegetables Potatoes (without the skin). Strained tomato and vegetable juices. Most well-cooked and canned vegetables without seeds. Tender lettuce. Fruits Cooked or canned applesauce, apricots, cherries, fruit cocktail, grapefruit, peaches, pears, or plums. Fresh bananas, apples without skin, cherries, grapes, cantaloupe, grapefruit, peaches, oranges, or plums.  Meat and Other Protein Products Baked or boiled chicken. Eggs. Tofu. Fish. Seafood. Smooth peanut butter. Ground or well-cooked tender beef, ham, veal, lamb, pork, or poultry.  Dairy Plain yogurt, kefir, and unsweetened liquid yogurt. Lactose-free milk, buttermilk, or soy milk. Plain hard cheese. Beverages Sport drinks. Clear broths. Diluted fruit juices (except prune). Regular, caffeine-free sodas such as ginger ale. Water. Decaffeinated teas. Oral rehydration solutions. Sugar-free beverages not sweetened with sugar alcohols. Other Bouillon, broth, or soups made from recommended foods.  The items listed above may not be a complete list of recommended foods or beverages. Contact your dietitian for more options. WHAT FOODS ARE NOT RECOMMENDED? Grains Whole grain, whole wheat, bran, or rye breads, rolls, pastas, crackers, and cereals. Wild or brown rice. Cereals that contain more than 2 g of fiber per serving. Corn tortillas or taco shells. Cooked or dry oatmeal. Granola. Popcorn. Vegetables Raw vegetables. Cabbage, broccoli, Brussels sprouts, artichokes, baked beans, beet greens, corn, kale, legumes, peas, sweet potatoes, and yams. Potato skins. Cooked spinach and cabbage. Fruits Dried fruit, including raisins and dates. Raw fruits. Stewed or dried prunes. Fresh apples with skin, apricots, mangoes, pears, raspberries, and strawberries.  Meat and Other Protein Products Chunky peanut butter. Nuts and seeds. Beans and lentils. Berniece Salines.  Dairy High-fat cheeses. Milk, chocolate  milk, and beverages made with milk, such as  milk shakes. Cream. Ice cream. Sweets and Desserts Sweet rolls, doughnuts, and sweet breads. Pancakes and waffles. Fats and Oils Butter. Cream sauces. Margarine. Salad oils. Plain salad dressings. Olives. Avocados.  Beverages Caffeinated beverages (such as coffee, tea, soda, or energy drinks). Alcoholic beverages. Fruit juices with pulp. Prune juice. Soft drinks sweetened with high-fructose corn syrup or sugar alcohols. Other Coconut. Hot sauce. Chili powder. Mayonnaise. Gravy. Cream-based or milk-based soups.  The items listed above may not be a complete list of foods and beverages to avoid. Contact your dietitian for more information. WHAT SHOULD I DO IF I BECOME DEHYDRATED? Diarrhea can sometimes lead to dehydration. Signs of dehydration include dark urine and dry mouth and skin. If you think you are dehydrated, you should rehydrate with an oral rehydration solution. These solutions can be purchased at pharmacies, retail stores, or online.  Drink -1 cup (120-240 mL) of oral rehydration solution each time you have an episode of diarrhea. If drinking this amount makes your diarrhea worse, try drinking smaller amounts more often. For example, drink 1-3 tsp (5-15 mL) every 5-10 minutes.  A general rule for staying hydrated is to drink 1-2 L of fluid per day. Talk to your health care provider about the specific amount you should be drinking each day. Drink enough fluids to keep your urine clear or pale yellow.   This information is not intended to replace advice given to you by your health care provider. Make sure you discuss any questions you have with your health care provider.   Document Released: 04/02/2003 Document Revised: 01/31/2014 Document Reviewed: 12/03/2012 Elsevier Interactive Patient Education [employer].

## 2015-05-29 NOTE — ED Provider Notes (Signed)
8:49 PM Patient relates particularly symptomatic. I discussed her case with our neurology colleagues. Patient will have MR studies of her brain, received Robaxin, continued fluids. Additional studies negative thus far include influenza.  10:45 PM Patient remains tachycardic.  She now notes that today (and only today) she has had mild cough.  XR pending.  I discussed the patient's case with our hospitalist for admission.    Carmin Muskrat, MD 05/29/15 [id number]

## 2015-05-29 NOTE — H&P (Addendum)
History and Physical    [name] WJX:[id number] DOB: May 24, 1985 DOA: 05/29/2015  Referring MD/NP/PA:   PCP: Collene Mares, PA-C   Outpatient Specialists: none  Patient coming from:  Home  Chief Complaint: Headache, spine pain, nausea, vomiting, diarrhea, cough  HPI: [name] is a 30 y.o. female with medical history significant of bacterial vaginosis, who presents with headache, spine pain, nausea, vomiting, diarrhea, cough.  Pt reports that she possibly had insect bite over her right elbow area 2 weeks ago. She has a small red rash in that area. She was treated with oral doxycycline and Decdron for 6 days. She was doing fine until 05/27/15 when she developed spine pain involving whole spine from neck to the lower back, and headache. She also reports that she had swelling in both hands and feet, which resolved spontaneously. She was admitted to [facility] on 05/27/15. She had negative work up, including RPR, RMSF, lyme, HIV, ESR=2, CRP=0.7. LP was done and CSF was negative for meningitis. She was discharged on oral doxycycline for 2 weeks as recommended by ID, Dr. Drucilla Schmidt on 5/4.   Pt states that she developed nausea, vomiting, diarrhea today. She vomited 5-6 times without blood in vomitus. She had 5-6 times if bowel movement with loose stool. She feels hot, and has chills. She continues to have headache, which is pressure like feeling, over occipital area, constant, nonradiating. She does not have unilateral weakness, numbness or tingling sensations. No vision change or hearing loss. No symptoms of UTI.  ED Course: pt was found to have lactate 0.91, WBC 24.8, temperature 99.7, tachycardia, tachypnea, electrolytes and renal function okay, lipase 30, negative urinalysis. Negative chest x-ray. Negative CT head for acute intracranial abnormalities. Patient is placed on telemetry bed for observation. Neurology, Dr. Nicole Kindred was consulted.   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 No frank Chiari 1 malformation, otherwise normal brain MRI. # MRA HEAD: Normal intracranial MRA. # MRV HEAD: Normal intracranial MRV.  Review of Systems:   General: has fevers, chills, no changes in body weight, has poor appetite, has fatigue HEENT: no blurry vision, hearing changes or sore throat Pulm: no dyspnea, coughing, wheezing CV: no chest pain, no palpitations Abd: has nausea, vomiting,  diarrhea, no constipation or abdominal pain, GU: no dysuria, burning on urination, increased urinary frequency, hematuria  Ext: no leg edema Neuro: no unilateral weakness, numbness, or tingling, no vision change or hearing loss. Has HA. Skin: has a red rash over right elbow MSK: has spin pain. Heme: No easy bruising.  Travel history: No recent long distant travel.  Allergy:  Allergies  Allergen Reactions  . Codeine Hives and Itching    Past Medical History  Diagnosis Date  . Hypoglycemia   . Vaginal discharge 03/18/2014  . Vaginal itching 03/18/2014  . BV (bacterial vaginosis) 03/18/2014  . Breast nodule 03/25/2014    Past Surgical History  Procedure Laterality Date  . Cesarean section  2009, 2012    x2  . Tonsillectomy    . Tubal ligation    . Mass excision N/A 07/23/2012    Procedure: EXCISION OF RIGHT LOWER ABDOMINAL MASS, ENDOMETRIAL IMPLANT;  Surgeon: Scherry Ran, MD;  Location: [facility];  Service: General;  Laterality: N/A;  . Supracervical abdominal hysterectomy N/A 02/19/2013    Procedure: HYSTERECTOMY SUPRACERVICAL ABDOMINAL;  Surgeon: [name], MD;  Location: [facility];  Service: Gynecology;  Laterality: N/A;  . Scar  revision N/A 02/19/2013    Procedure: EXCISION OF SCAR ENDOMETRIOSIS;  Surgeon: [name], MD;  Location: [facility];  Service: Gynecology;  Laterality: N/A;  . Lysis of adhesion N/A 02/19/2013    Procedure: LYSIS OF ADHESION;  Surgeon: [name], MD;  Location: [facility];   Service: Gynecology;  Laterality: N/A;  . Abdominal hysterectomy      Social History:  reports that she has never smoked. She has never used smokeless tobacco. She reports that she does not drink alcohol or use illicit drugs.  Family History:  Family History  Problem Relation Age of Onset  . Hypertension Father   . Diabetes Other   . Heart disease Other   . Colon cancer Other   . Heart disease Other      Prior to Admission medications   Medication Sig Start Date End Date Taking? Authorizing Provider  ibuprofen (ADVIL,MOTRIN) 200 MG tablet Take 200-400 mg by mouth every 6 (six) hours as needed for mild pain.   Yes Historical Provider, MD  cyclobenzaprine (FLEXERIL) 10 MG tablet Take 1 tablet (10 mg total) by mouth 2 (two) times daily as needed for muscle spasms. 05/29/15   Margarita Mail, PA-C  doxycycline (DORYX) 100 MG EC tablet Take 1 tablet (100 mg total) by mouth 2 (two) times daily. 05/28/15   Erline Hau, MD  naproxen (NAPROSYN) 500 MG tablet Take 1 tablet (500 mg total) by mouth 2 (two) times daily with a meal. 05/29/15   Margarita Mail, PA-C  ondansetron (ZOFRAN) 4 MG tablet Take 1 tablet (4 mg total) by mouth every 8 (eight) hours as needed for nausea or vomiting. 05/29/15   Margarita Mail, PA-C    Physical Exam: Filed Vitals:   05/29/15 2030 05/29/15 2115 05/29/15 2117 05/29/15 2245  BP: 130/87  135/87 103/67  Pulse:  116 119 112  Temp:   98.8 F (37.1 C)   TempSrc:   Oral   Resp:   18 19  Height:      Weight:      SpO2: 98% 99% 100% 98%   General: Not in acute distress HEENT:       Eyes: PERRL, EOMI, no scleral icterus.       ENT: No discharge from the ears and nose, no pharynx injection, no tonsillar enlargement.        Neck: No JVD, no bruit, no mass felt. Heme: No neck lymph node enlargement. Cardiac: S1/S2, RRR, Tachycardia, No murmurs, No gallops or rubs. Pulm: No rales, wheezing, rhonchi or rubs. Abd: Soft, nondistended, nontender, no rebound pain,  no organomegaly, BS present. GU: No hematuria Ext: No pitting leg edema bilaterally. 2+DP/PT pulse bilaterally. Musculoskeletal: has diffused spin pain from neck to lower back. Skin: has a small red rash over right elbow. Neuro: Alert, oriented X3, cranial nerves II-XII grossly intact, moves all extremities normally. Muscle strength 5/5 in all extremities, sensation to light touch intact. Knee reflex 1+ bilaterally. Negative Babinski's sign. Normal finger to nose test. Neck is supple. Psych: Patient is not psychotic, no suicidal or hemocidal ideation.  Labs on Admission: I have personally reviewed following labs and imaging studies  CBC:  Recent Labs Lab 05/27/15 1013 05/28/15 0620 05/29/15 1139 05/29/15 1410  WBC 17.5* 13.6* 24.8* 22.0*  NEUTROABS 11.7*  --   --  20.5*  HGB 17.1* 13.9 16.9* 15.9*  HCT 49.7* 41.8 49.2* 47.3*  MCV 85.5 87.6 87.2 88.2  PLT 264 204 266 218   Basic  Metabolic Panel:  Recent Labs Lab 05/27/15 1013 05/28/15 0620 05/29/15 1139  NA 139 140 138  K 4.1 4.0 4.0  CL 99* 106 102  CO2 _0 GLUCOSE 89 123* 95  BUN _1 CREATININE 0.94 1.00 0.87  CALCIUM 9.1 8.2* 9.3   GFR: Estimated Creatinine Clearance: 101.7 mL/min (by C-G formula based on Cr of 0.87). Liver Function Tests:  Recent Labs Lab 05/27/15 1013 05/28/15 0620 05/29/15 1139  AST 13* 12* 19  ALT _2 ALKPHOS 71 55 68  BILITOT 0.7 0.3 0.7  PROT 6.8 5.2* 6.7  ALBUMIN 3.7 2.7* 3.6    Recent Labs Lab 05/29/15 1139  LIPASE 30   No results for input(s): AMMONIA in the last 168 hours. Coagulation Profile: No results for input(s): INR, PROTIME in the last 168 hours. Cardiac Enzymes: No results for input(s): CKTOTAL, CKMB, CKMBINDEX, TROPONINI in the last 168 hours. BNP (last 3 results) No results for input(s): PROBNP in the last 8760 hours. HbA1C: No results for input(s): HGBA1C in the last 72 hours. CBG: No results for input(s): GLUCAP in the last 168  hours. Lipid Profile: No results for input(s): CHOL, HDL, LDLCALC, TRIG, CHOLHDL, LDLDIRECT in the last 72 hours. Thyroid Function Tests: No results for input(s): TSH, T4TOTAL, FREET4, T3FREE, THYROIDAB in the last 72 hours. Anemia Panel: No results for input(s): VITAMINB12, FOLATE, FERRITIN, TIBC, IRON, RETICCTPCT in the last 72 hours. Urine analysis:    Component Value Date/Time   COLORURINE YELLOW 05/29/2015 [place] 05/29/2015 1445   LABSPEC 1.007 05/29/2015 1445   PHURINE 7.0 05/29/2015 1445   GLUCOSEU NEGATIVE 05/29/2015 1445   HGBUR NEGATIVE 05/29/2015 [place] 05/29/2015 1445   KETONESUR NEGATIVE 05/29/2015 1445   PROTEINUR NEGATIVE 05/29/2015 1445   PROTEINUR neg 03/18/2014 1120   UROBILINOGEN 0.2 10/04/2014 1451   NITRITE NEGATIVE 05/29/2015 1445   NITRITE neg 03/18/2014 1120   LEUKOCYTESUR NEGATIVE 05/29/2015 1445   Sepsis Labs: _3 (procalcitonin:4,lacticidven:4) ) Recent Results (from the past 240 hour(s))  CSF culture     Status: None (Preliminary result)   Collection Time: 05/27/15  3:08 PM  Result Value Ref Range Status   Specimen Description CSF  Final   Special Requests NONE  Final   Gram Stain   Final    CYTOSPIN SMEAR WBC PRESENT, PREDOMINANTLY MONONUCLEAR FUSIFORM NO ORGANISMS SEEN IN PAIRS CORRECTED RESULTS WBC PRESENT, PREDOMINANTLY MONONUCLEAR NO ORGANISMS SEEN CYTOSPIN SMEAR CORRECTED RESULTS CALLED TO: L. DAVIS,RN AT 0258 ON [id number] BY Rhea Bleacher    Culture   Final    NO GROWTH 2 DAYS Performed at [facility]    Report Status PENDING  Incomplete     Radiological Exams on Admission: No results found.   EKG: Not done in ED, will get one.   Assessment/Plan Principal Problem:   Headache Active Problems:   Leukocytosis   Sepsis (HCC)   Nausea, vomiting and diarrhea  Headache: etiology is not clear. Neurology was consulted. Dr. Nicole Kindred recommended MRI, MRA and MRV of brain which are  not impressive. Previous work up are negative, including RPR, RMSF, lyme, HIV, ESR=2, CRP=0.7. LP was done and CSF was negative for meningitis. CSF culture no growth so far.   -will admit to tele bed for obs -f/u neuro recommendations -continue doxycycline, switched to IV since patient could not tolerate oral meds. -Frequent neuro check -When necessary Percocet and dilaudid for pain (pt has nausea and vomiting,  not able to tolerate oral Percocet).  Sepsis vs SIRS: Patient meets criteria for sepsis or SIRS with tachycardia, leukocytosis, tachypnea. Lactate is normal. Hemodynamically stable. Etiology is not clear. -On IV doxycycline as above --will get Procalcitonin and trend lactic acid levels per sepsis protocol. -IVF: 3L of NS bolus in ED, followed by 125 cc/h  -f/u blood culture -check respiratory virus panel, rapid strep -check RF and ANA given recent swelling hand and feet (?still disease) -May consult to ID in AM.  Nausea, vomiting and diarrhea: Etiology is not clear. Given recent doxycycline use, wil need to rule out C. difficile colitis. -check c diff pcr and GI path panel. -IVF as above -prn zofran for nausea.   DVT ppx: SCD Code Status: Full code Family Communication: None at bed side.  Disposition Plan:  Anticipate discharge back to previous home environment Consults called:  Neuro, dr. Dr. Nicole Kindred Admission status: Obs / tele  Date of Service 05/29/2015    Ivor Costa [facility] Pager [phone]  If 7PM-7AM, please contact night-coverage [email or web address] Password [email or web address] 05/29/2015, 11:26 PM

## 2015-05-29 NOTE — ED Provider Notes (Signed)
CSN: [id number]     Arrival date & time 05/29/15  1124 History   First MD Initiated Contact with Patient 05/29/15 1219     Chief Complaint  Patient presents with  . Headache  . Emesis     (Consider location/radiation/quality/duration/timing/severity/associated sxs/prior Treatment) HPI   This is a 30 year old female who presents the emergency department with chief complaint of headache. The patient was released from [facility] for same complaint yesterday. According to the patient 1 week ago she had an infection on her arm. She was seen at [facility] in fast track and given Decadron for treatment of her inflammatory response versus infection as well as doxycycline. The patient completed a 6 day course as prescribed of doxycycline. Patient states that she is feeling fine and over this past weekend, went camping with her son. Monday with some neck and back discomfort after camping. She states that the next day when she woke she had severe neck and shoulder pain as well as severe headache, pain with eye movement and lower back pain. She has been related to any panel with a thorough workup of her headache, including Lyme and RMSF titers, which were negative, negative head CT, fluoroscopy guided lumbar puncture, which showed no signs of meningitis. Patient states that she was discharged with more doxycycline. Today her headache is much worse. She describes it as pressure in the back of her head. She has full range of motion of her neck. She describes pressure behind her eyes. Review of the electronic medical records does not show neurology consult, opening pressures of lumbar puncture, or MRI. The patient states that she has been nauseous and vomiting. She's had several episodes of diarrhea with vomiting on the way here to the emergency department. She denies being able to start her doxycycline after discharge yesterday.  Past Medical History  Diagnosis Date  . Hypoglycemia   . Vaginal discharge  03/18/2014  . Vaginal itching 03/18/2014  . BV (bacterial vaginosis) 03/18/2014  . Breast nodule 03/25/2014   Past Surgical History  Procedure Laterality Date  . Cesarean section  2009, 2012    x2  . Tonsillectomy    . Tubal ligation    . Mass excision N/A 07/23/2012    Procedure: EXCISION OF RIGHT LOWER ABDOMINAL MASS, ENDOMETRIAL IMPLANT;  Surgeon: Scherry Ran, MD;  Location: [facility];  Service: General;  Laterality: N/A;  . Supracervical abdominal hysterectomy N/A 02/19/2013    Procedure: HYSTERECTOMY SUPRACERVICAL ABDOMINAL;  Surgeon: [name], MD;  Location: [facility];  Service: Gynecology;  Laterality: N/A;  . Scar revision N/A 02/19/2013    Procedure: EXCISION OF SCAR ENDOMETRIOSIS;  Surgeon: [name], MD;  Location: [facility];  Service: Gynecology;  Laterality: N/A;  . Lysis of adhesion N/A 02/19/2013    Procedure: LYSIS OF ADHESION;  Surgeon: [name], MD;  Location: [facility];  Service: Gynecology;  Laterality: N/A;  . Abdominal hysterectomy     Family History  Problem Relation Age of Onset  . Hypertension Father   . Diabetes Other   . Heart disease Other   . Colon cancer Other   . Heart disease Other    Social History  Substance Use Topics  . Smoking status: Never Smoker   . Smokeless tobacco: Never Used  . Alcohol Use: No   OB History    Gravida Para Term Preterm AB TAB SAB Ectopic Multiple Living   2 2  Review of Systems  Ten systems reviewed and are negative for acute change, except as noted in the HPI. \   Allergies  Codeine  Home Medications   Prior to Admission medications   Medication Sig Start Date End Date Taking? Authorizing Provider  doxycycline (DORYX) 100 MG EC tablet Take 1 tablet (100 mg total) by mouth 2 (two) times daily. 05/28/15   Erline Hau, MD   BP 124/91 mmHg  Pulse 102  Temp(Src) 97.6 F (36.4 C) (Oral)  Resp 28  Ht 5\' 2"  (1.575 m)  Wt 90.719 kg  BMI 36.57 kg/m2  SpO2 100%  LMP  01/08/2013 Physical Exam  Constitutional: She is oriented to person, place, and time. She appears well-developed and well-nourished. No distress.  HENT:  Head: Normocephalic and atraumatic.  Eyes: Conjunctivae are normal. No scleral icterus.  Fundoscopic exam:      The right eye shows no papilledema.       The left eye shows no papilledema.  Neck: Normal range of motion.  Cardiovascular: Normal rate, regular rhythm and normal heart sounds.  Exam reveals no gallop and no friction rub.   No murmur heard. Pulmonary/Chest: Effort normal and breath sounds normal. No respiratory distress.  Abdominal: Soft. Bowel sounds are normal. She exhibits no distension and no mass. There is no tenderness. There is no guarding.  Neurological: She is alert and oriented to person, place, and time.  Skin: Skin is warm and dry. She is not diaphoretic.    ED Course  Procedures (including critical care time) Labs Review Labs Reviewed  LIPASE, BLOOD  COMPREHENSIVE METABOLIC PANEL  CBC  URINALYSIS, ROUTINE W REFLEX MICROSCOPIC (NOT AT [facility])    Imaging Review Ct Head Wo Contrast  05/27/2015  CLINICAL DATA:  Left seen for spider bite generalize weakness. Blurry vision. EXAM: CT HEAD WITHOUT CONTRAST TECHNIQUE: Contiguous axial images were obtained from the base of the skull through the vertex without intravenous contrast. COMPARISON:  None. FINDINGS: No acute cortical infarct, hemorrhage, or mass lesion ispresent. Ventricles are of normal size. No significant extra-axial fluid collection is present. The paranasal sinuses andmastoid air cells are clear. The osseous skull is intact. IMPRESSION: Normal brain. Electronically Signed   By: Kerby Moors M.D.   On: 05/27/2015 13:47   Dg Lumbar Puncture Fluoro Guide  05/27/2015  CLINICAL DATA:  Neck pain and stiffness question meningitis EXAM: DIAGNOSTIC LUMBAR PUNCTURE UNDER FLUOROSCOPIC GUIDANCE FLUOROSCOPY TIME:  Radiation Exposure Index (as provided by the  fluoroscopic device): Not provided If the device does not provide the exposure index: Fluoroscopy Time (in minutes and seconds):  0 minutes 24 seconds Number of Acquired Images:  2 PROCEDURE: Procedure, benefits, and risks were discussed with the patient, including alternatives. Patient's questions were answered. Written informed consent was obtained. Timeout protocol followed. Patient placed prone. L4-L5 disc space was localized under fluoroscopy. Skin prepped and draped in usual sterile fashion. Skin and soft tissues anesthetized with 3 mL of 1% lidocaine. 22 gauge needle was advanced into the spinal canal where clear colorless CSF was encountered. Opening pressure was not assessed. 9 mL of CSF was obtained in 4 tubes for requested analysis. Procedure tolerated very well by patient without immediate complication. IMPRESSION: Successful lumbar puncture as above. Electronically Signed   By: Lavonia Dana M.D.   On: 05/27/2015 15:35   I have personally reviewed and evaluated these images and lab results as part of my medical decision-making.   EKG Interpretation None  MDM   Final diagnoses:  None    4:56 PM BP 119/84 mmHg  Pulse 111  Temp(Src) 98.4 F (36.9 C) (Oral)  Resp 21  Ht 5\' 2"  (1.575 m)  Wt 90.719 kg  BMI 36.57 kg/m2  SpO2 95%  LMP 01/08/2013 Patient elevated white blood Count after several days of oral Decadron. The she has nausea, vomiting and diarrhea today. No abdominal pain. The workup for possible meningitis. However, the patient never had a headache. She does not have a headache today. She is able to move her neck in all directions. She complains that her neck and back stiffness is worse with movement. I feel this is all musculoskeletal. I think the white count is secondary to acute viral illness were secondary to several days of oral steroids. She is tachycardic. Currently, however, had nausea and vomiting today. She will receive a bag of fluids. I suspect discharge.  Discussed this case with Dr. Vanita Panda who will assume care for discharge. I doubt C. difficile colitis. This patient states that she only had small amounts of watery diarrhea with vomiting. Also, because of the low Association of C. difficile colitis with the tetracycline class. She does not have any abdominal pain.    Patient with persistent tahcycardia. I have given signout to Dr. Vanita Panda. Patient will continue fluid bolus.   Margarita Mail, PA-C 06/06/15 0216  Gareth Morgan, MD 06/08/15 [phone]

## 2015-05-29 NOTE — ED Notes (Signed)
Patient going to xray

## 2015-05-30 ENCOUNTER — Inpatient Hospital Stay (HOSPITAL_COMMUNITY): Payer: MEDICAID

## 2015-05-30 DIAGNOSIS — R21 Rash and other nonspecific skin eruption: Secondary | ICD-10-CM

## 2015-05-30 DIAGNOSIS — G44201 Tension-type headache, unspecified, intractable: Secondary | ICD-10-CM

## 2015-05-30 DIAGNOSIS — R51 Headache: Secondary | ICD-10-CM

## 2015-05-30 DIAGNOSIS — R1110 Vomiting, unspecified: Secondary | ICD-10-CM

## 2015-05-30 DIAGNOSIS — A419 Sepsis, unspecified organism: Secondary | ICD-10-CM

## 2015-05-30 DIAGNOSIS — D72829 Elevated white blood cell count, unspecified: Secondary | ICD-10-CM

## 2015-05-30 DIAGNOSIS — R197 Diarrhea, unspecified: Secondary | ICD-10-CM

## 2015-05-30 DIAGNOSIS — M2560 Stiffness of unspecified joint, not elsewhere classified: Secondary | ICD-10-CM

## 2015-05-30 DIAGNOSIS — R112 Nausea with vomiting, unspecified: Secondary | ICD-10-CM

## 2015-05-30 LAB — BASIC METABOLIC PANEL
Anion gap: 9 (ref 5–15)
BUN: 8 mg/dL (ref 6–20)
CHLORIDE: 104 mmol/L (ref 101–111)
CO2: 25 mmol/L (ref 22–32)
Calcium: 8.6 mg/dL — ABNORMAL LOW (ref 8.9–10.3)
Creatinine, Ser: 0.94 mg/dL (ref 0.44–1.00)
GFR calc Af Amer: >60 mL/min (ref >60)
Glucose, Bld: 108 mg/dL — ABNORMAL HIGH (ref 65–99)
POTASSIUM: 4.2 mmol/L (ref 3.5–5.1)
Sodium: 138 mmol/L (ref 135–145)

## 2015-05-30 LAB — RESPIRATORY PANEL BY PCR
Adenovirus: NOT DETECTED
BORDETELLA PERTUSSIS-RVPCR: NOT DETECTED
CHLAMYDOPHILA PNEUMONIAE-RVPPCR: NOT DETECTED
CORONAVIRUS 229E-RVPPCR: NOT DETECTED
CORONAVIRUS HKU1-RVPPCR: NOT DETECTED
CORONAVIRUS NL63-RVPPCR: NOT DETECTED
Coronavirus OC43: NOT DETECTED
INFLUENZA A H1 2009-RVPPR: NOT DETECTED
Influenza A H1: NOT DETECTED
Influenza A H3: NOT DETECTED
Influenza A: NOT DETECTED
Influenza B: NOT DETECTED
MYCOPLASMA PNEUMONIAE-RVPPCR: NOT DETECTED
Metapneumovirus: NOT DETECTED
PARAINFLUENZA VIRUS 3-RVPPCR: NOT DETECTED
Parainfluenza Virus 1: NOT DETECTED
Parainfluenza Virus 2: NOT DETECTED
Parainfluenza Virus 4: NOT DETECTED
RHINOVIRUS / ENTEROVIRUS - RVPPCR: DETECTED — AB
Respiratory Syncytial Virus: NOT DETECTED

## 2015-05-30 LAB — CBC
HEMATOCRIT: 42.7 % (ref 36.0–46.0)
Hemoglobin: 14.3 g/dL (ref 12.0–15.0)
MCH: 29.5 pg (ref 26.0–34.0)
MCHC: 33.5 g/dL (ref 30.0–36.0)
MCV: 88 fL (ref 78.0–100.0)
Platelets: 197 10*3/uL (ref 150–400)
RBC: 4.85 MIL/uL (ref 3.87–5.11)
RDW: 13.3 % (ref 11.5–15.5)
WBC: 14.3 10*3/uL — ABNORMAL HIGH (ref 4.0–10.5)

## 2015-05-30 LAB — GLUCOSE, CAPILLARY: Glucose-Capillary: 85 mg/dL (ref 65–99)

## 2015-05-30 LAB — PROTIME-INR
INR: 1.08 (ref 0.00–1.49)
PROTHROMBIN TIME: 14.2 s (ref 11.6–15.2)

## 2015-05-30 LAB — RAPID STREP SCREEN (MED CTR MEBANE ONLY): STREPTOCOCCUS, GROUP A SCREEN (DIRECT): NEGATIVE

## 2015-05-30 LAB — LACTIC ACID, PLASMA
LACTIC ACID, VENOUS: 0.9 mmol/L (ref 0.5–2.0)
LACTIC ACID, VENOUS: 1.8 mmol/L (ref 0.5–2.0)

## 2015-05-30 LAB — PROCALCITONIN: PROCALCITONIN: 0.1 ng/mL

## 2015-05-30 LAB — APTT: APTT: 29 s (ref 24–37)

## 2015-05-30 MED ORDER — DOXYCYCLINE HYCLATE 100 MG PO TABS
200.0000 mg | ORAL_TABLET | Freq: Two times a day (BID) | ORAL | Status: DC
  Administered 2015-05-30: 200 mg via ORAL
  Filled 2015-05-30: qty 2

## 2015-05-30 MED ORDER — IOPAMIDOL (ISOVUE-300) INJECTION 61%
INTRAVENOUS | Status: AC
  Administered 2015-05-31: 100 mL
  Filled 2015-05-30: qty 100

## 2015-05-30 MED ORDER — DIATRIZOATE MEGLUMINE & SODIUM 66-10 % PO SOLN
ORAL | Status: AC
  Administered 2015-05-30: 30 mL
  Filled 2015-05-30: qty 30

## 2015-05-30 NOTE — Progress Notes (Signed)
New Admission Note:   Arrival: From ED via stretcher Mental Orientation: A&Ox4 Telemetry: Placed on box 6e17 Assessment:  See doc flowsheet Skin: Intact.  Swelling noted on bilateral upper extremities.  Small, slightly red insect bite noted on right upper arm. IV: Left AC IV Pain: None Safety Measures:  Call bell placed within reach; patient instructed on use of call bell and verbalized understanding. Bed in lowest position.   [address] Orientation: Patient oriented to staff, room, and unit. Family: Husband in room  Orders have been reviewed and implemented. Admit questions completed. Will continue to monitor.  Arlyss Queen, RN3, RN-BC, BSN

## 2015-05-30 NOTE — Progress Notes (Signed)
PROGRESS NOTE  [name] HYW:[id number] DOB: 1985/09/09 DOA: 05/29/2015 PCP: Collene Mares, PA-C  HPI/Recap of past 24 hours: 30 y.o. female with medical history significant of bacterial vaginosis, recently discharged from [facility] after LP for spinal pain and headache after starting doxycycline for a tick bite who presented to this hospital 36 hours after [facility] discharge with headache, spine pain, nausea, vomiting, diarrhea, cough. She was admitted, started on IV doxy and doing better now.   Assessment/Plan: Headache: etiology is not clear. Neurology was consulted. Dr. Nicole Kindred recommended MRI, MRA and MRV of brain which are not impressive. Previous work up are negative, including RPR, RMSF, lyme, HIV, ESR=2, CRP=0.7. LP was done and CSF was negative for meningitis. CSF culture no growth so far.   -will admit to tele bed for obs -f/u neuro recommendations - continue doxycycline for now, back to PO since nausea better -Frequent neuro check -When necessary Percocet for pain  Sepsis vs SIRS: Patient meets criteria for sepsis or SIRS with tachycardia, leukocytosis, tachypnea. Lactate is normal. Hemodynamically stable. Etiology is not clear. - On doxycycline as above --will get Procalcitonin and trend lactic acid levels per sepsis protocol. - IVF: 3L of NS bolus in ED, followed by 125 cc/h  -f/u blood culture -check respiratory virus panel, rapid strep -check RF and ANA given recent swelling hand and feet (?still disease) - ID consulted  Nausea, vomiting and diarrhea: Etiology is not clear. Given recent doxycycline use, wil need to rule out C. difficile colitis. -check c diff pcr and GI path panel. -IVF as above -prn zofran for nausea.   Code Status: Full   Family Communication: None   Disposition Plan: Home     Consultants:  ID   Procedures:  None   Antimicrobials:  IV doxy to PO doxy    Objective: Filed Vitals:   05/30/15 0100 05/30/15 0115 05/30/15 0151 05/30/15 0652    BP: 125/89 127/83 117/63 106/56  Pulse: 108 117 109 80  Temp:   99 F (37.2 C) 97.8 F (36.6 C)  TempSrc:   Oral Oral  Resp:  _0 Height:   _1  (1.575 m)   Weight:   96.6 kg (212 lb 15.4 oz)   SpO2: 99% 99% 99% 97%    Intake/Output Summary (Last 24 hours) at 05/30/15 0835 Last data filed at 05/30/15 4854  Gross per 24 hour  Intake 1012.92 ml  Output    800 ml  Net 212.92 ml   Filed Weights   05/29/15 1133 05/30/15 0151  Weight: 90.719 kg (200 lb) 96.6 kg (212 lb 15.4 oz)    Exam: General:  Alert, oriented, calm, in no acute distress Eyes: pupils round and reactive to light and accomodation, clear sclerea Neck: supple, no masses, trachea mildline  Cardiovascular: RRR, no murmurs or rubs, no peripheral edema  Respiratory: clear to auscultation bilaterally, no wheezes, no crackles  Abdomen: soft, nontender, nondistended, normal bowel tones heard  Skin: dry, small erythematous patch R elbow Musculoskeletal: no joint effusions, normal range of motion  Psychiatric: appropriate affect, normal speech  Neurologic: extraocular muscles intact, clear speech, moving all extremities with intact sensorium    Data Reviewed: CBC:  Recent Labs Lab 05/27/15 1013 05/28/15 0620 05/29/15 1139 05/29/15 1410 05/30/15 0424  WBC 17.5* 13.6* 24.8* 22.0* 14.3*  NEUTROABS 11.7*  --   --  20.5*  --   HGB 17.1* 13.9 16.9* 15.9* 14.3  HCT 49.7* 41.8 49.2* 47.3* 42.7  MCV 85.5 87.6  87.2 88.2 88.0  PLT 264 204 266 218 993   Basic Metabolic Panel:  Recent Labs Lab 05/27/15 1013 05/28/15 0620 05/29/15 1139 05/30/15 0424  NA 139 140 138 138  K 4.1 4.0 4.0 4.2  CL 99* 106 102 104  CO2 _0 GLUCOSE 89 123* 95 108*  BUN _1 CREATININE 0.94 1.00 0.87 0.94  CALCIUM 9.1 8.2* 9.3 8.6*   GFR: Estimated Creatinine Clearance: 97.5 mL/min (by C-G formula based on Cr of 0.94). Liver Function Tests:  Recent Labs Lab 05/27/15 1013 05/28/15 0620 05/29/15 1139   AST 13* 12* 19  ALT _2 ALKPHOS 71 55 68  BILITOT 0.7 0.3 0.7  PROT 6.8 5.2* 6.7  ALBUMIN 3.7 2.7* 3.6    Recent Labs Lab 05/29/15 1139  LIPASE 30   No results for input(s): AMMONIA in the last 168 hours. Coagulation Profile:  Recent Labs Lab 05/30/15  INR 1.08   Cardiac Enzymes: No results for input(s): CKTOTAL, CKMB, CKMBINDEX, TROPONINI in the last 168 hours. BNP (last 3 results) No results for input(s): PROBNP in the last 8760 hours. HbA1C: No results for input(s): HGBA1C in the last 72 hours. CBG: No results for input(s): GLUCAP in the last 168 hours. Lipid Profile: No results for input(s): CHOL, HDL, LDLCALC, TRIG, CHOLHDL, LDLDIRECT in the last 72 hours. Thyroid Function Tests: No results for input(s): TSH, T4TOTAL, FREET4, T3FREE, THYROIDAB in the last 72 hours. Anemia Panel: No results for input(s): VITAMINB12, FOLATE, FERRITIN, TIBC, IRON, RETICCTPCT in the last 72 hours. Urine analysis:    Component Value Date/Time   COLORURINE YELLOW 05/29/2015 [place] 05/29/2015 1445   LABSPEC 1.007 05/29/2015 1445   PHURINE 7.0 05/29/2015 1445   GLUCOSEU NEGATIVE 05/29/2015 1445   HGBUR NEGATIVE 05/29/2015 [place] 05/29/2015 1445   KETONESUR NEGATIVE 05/29/2015 1445   PROTEINUR NEGATIVE 05/29/2015 1445   PROTEINUR neg 03/18/2014 1120   UROBILINOGEN 0.2 10/04/2014 1451   NITRITE NEGATIVE 05/29/2015 1445   NITRITE neg 03/18/2014 1120   LEUKOCYTESUR NEGATIVE 05/29/2015 1445   Sepsis Labs: _3 (procalcitonin:4,lacticidven:4)  ) Recent Results (from the past 240 hour(s))  CSF culture     Status: None (Preliminary result)   Collection Time: 05/27/15  3:08 PM  Result Value Ref Range Status   Specimen Description CSF  Final   Special Requests NONE  Final   Gram Stain   Final    CYTOSPIN SMEAR WBC PRESENT, PREDOMINANTLY MONONUCLEAR FUSIFORM NO ORGANISMS SEEN IN PAIRS CORRECTED RESULTS WBC PRESENT, PREDOMINANTLY  MONONUCLEAR NO ORGANISMS SEEN CYTOSPIN SMEAR CORRECTED RESULTS CALLED TO: L. DAVIS,RN AT [id number] ON [id number] BY Rhea Bleacher    Culture   Final    NO GROWTH 2 DAYS Performed at [facility]    Report Status PENDING  Incomplete      Studies: Dg Chest 2 View  05/29/2015  CLINICAL DATA:  Initial evaluation for acute leukocytosis. EXAM: CHEST  2 VIEW COMPARISON:  None. FINDINGS: The heart size and mediastinal contours are within normal limits. Both lungs are clear. The visualized skeletal structures are unremarkable. IMPRESSION: No active cardiopulmonary disease. Electronically Signed   By: Jeannine Boga M.D.   On: 05/29/2015 23:25   Mr Angiogram Head Wo Contrast  05/30/2015  CLINICAL DATA:  Initial evaluation for persistent headache, neck stiffness, nausea, vomiting. EXAM: MRI HEAD WITHOUT CONTRAST MRA HEAD WITHOUT CONTRAST MRV HEAD WITHOUT CONTRAST TECHNIQUE: Multiplanar, multiecho pulse sequences  of the brain and surrounding structures were obtained without intravenous contrast. Angiographic images of the intracranial arterial and venous structures were obtained using MRV technique without intravenous contrast. COMPARISON:  PRIOR CT from FINDINGS: MRI BRAIN FINDINGS: Cerebral volume normal for patient age. No focal parenchymal signal abnormality. No abnormal foci of restricted diffusion to suggest acute intracranial infarct. Gray-white matter differentiation well maintained. Major intracranial vascular flow voids preserved. No areas of chronic infarction. No acute or chronic intracranial hemorrhage. No mass lesion, midline shift, or mass effect. No hydrocephalus. No extra-axial fluid collection. Cerebellar tonsils are low lying positioned up to 9 mm 5 mm below the foramen magnum, consistent with cerebellar tonsillar ectopia. No frank Chiari malformation identified. Visualized upper cervical spine within normal limits. Pituitary gland normal.  No acute abnormality about the orbits. Mild  mucosal thickening within the paranasal sinuses. No air-fluid levels to suggest active sinus infection. No mastoid effusion. Inner ear structures grossly normal. Bone marrow signal intensity within normal limits. No scalp soft tissue abnormality. MRA HEAD FINDINGS: ANTERIOR CIRCULATION: Visualized distal cervical segments of the internal carotid arteries are widely patent with antegrade flow. Petrous, cavernous, and supraclinoid segments widely patent. A1 segments, anterior communicating artery, and anterior cerebral arteries are well opacified. M1 segments widely patent without stenosis or occlusion. MCA bifurcations normal. Distal MCA branches well opacified and symmetric. POSTERIOR CIRCULATION: Right vertebral artery dominant and widely patent to the vertebrobasilar junction. Diminutive left vertebral artery terminates in PICA. Basilar artery widely patent. Superior cerebral arteries patent bilaterally. Prominent right posterior communicating artery supplies the right posterior cerebral arteries. Left PCA arises from the basilar artery. PCAs well opacified bilaterally. No aneurysm or vascular malformation. MRV HEAD FINDINGS: Dedicated MRV images demonstrate no filling defect to suggest venous sinus thrombosis. Specifically, the superior sagittal, transverse, and sigmoid sinuses are patent. Straight sinus, vein of Galen, and internal cerebral veins are patent. No definite dural sinus stenosis. IMPRESSION: MRI HEAD IMPRESSION: 1. Cerebellar tonsillar ectopia with the cerebellar tonsils positioned up to 5 mm below the foramen magnum. No frank Chiari 1 malformation. 2. Otherwise normal brain MRI. MRA HEAD IMPRESSION: Normal intracranial MRA. MRV HEAD IMPRESSION: Normal intracranial MRV. Electronically Signed   By: Jeannine Boga M.D.   On: 05/30/2015 01:18   Mr Brain Wo Contrast  05/30/2015  CLINICAL DATA:  Initial evaluation for persistent headache, neck stiffness, nausea, vomiting. EXAM: MRI HEAD WITHOUT  CONTRAST MRA HEAD WITHOUT CONTRAST MRV HEAD WITHOUT CONTRAST TECHNIQUE: Multiplanar, multiecho pulse sequences of the brain and surrounding structures were obtained without intravenous contrast. Angiographic images of the intracranial arterial and venous structures were obtained using MRV technique without intravenous contrast. COMPARISON:  PRIOR CT from FINDINGS: MRI BRAIN FINDINGS: Cerebral volume normal for patient age. No focal parenchymal signal abnormality. No abnormal foci of restricted diffusion to suggest acute intracranial infarct. Gray-white matter differentiation well maintained. Major intracranial vascular flow voids preserved. No areas of chronic infarction. No acute or chronic intracranial hemorrhage. No mass lesion, midline shift, or mass effect. No hydrocephalus. No extra-axial fluid collection. Cerebellar tonsils are low lying positioned up to 9 mm 5 mm below the foramen magnum, consistent with cerebellar tonsillar ectopia. No frank Chiari malformation identified. Visualized upper cervical spine within normal limits. Pituitary gland normal.  No acute abnormality about the orbits. Mild mucosal thickening within the paranasal sinuses. No air-fluid levels to suggest active sinus infection. No mastoid effusion. Inner ear structures grossly normal. Bone marrow signal intensity within normal limits. No scalp soft tissue abnormality. MRA HEAD  FINDINGS: ANTERIOR CIRCULATION: Visualized distal cervical segments of the internal carotid arteries are widely patent with antegrade flow. Petrous, cavernous, and supraclinoid segments widely patent. A1 segments, anterior communicating artery, and anterior cerebral arteries are well opacified. M1 segments widely patent without stenosis or occlusion. MCA bifurcations normal. Distal MCA branches well opacified and symmetric. POSTERIOR CIRCULATION: Right vertebral artery dominant and widely patent to the vertebrobasilar junction. Diminutive left vertebral artery  terminates in PICA. Basilar artery widely patent. Superior cerebral arteries patent bilaterally. Prominent right posterior communicating artery supplies the right posterior cerebral arteries. Left PCA arises from the basilar artery. PCAs well opacified bilaterally. No aneurysm or vascular malformation. MRV HEAD FINDINGS: Dedicated MRV images demonstrate no filling defect to suggest venous sinus thrombosis. Specifically, the superior sagittal, transverse, and sigmoid sinuses are patent. Straight sinus, vein of Galen, and internal cerebral veins are patent. No definite dural sinus stenosis. IMPRESSION: MRI HEAD IMPRESSION: 1. Cerebellar tonsillar ectopia with the cerebellar tonsils positioned up to 5 mm below the foramen magnum. No frank Chiari 1 malformation. 2. Otherwise normal brain MRI. MRA HEAD IMPRESSION: Normal intracranial MRA. MRV HEAD IMPRESSION: Normal intracranial MRV. Electronically Signed   By: Jeannine Boga M.D.   On: 05/30/2015 01:18   Mr Hilary Hertz  05/30/2015  CLINICAL DATA:  Initial evaluation for persistent headache, neck stiffness, nausea, vomiting. EXAM: MRI HEAD WITHOUT CONTRAST MRA HEAD WITHOUT CONTRAST MRV HEAD WITHOUT CONTRAST TECHNIQUE: Multiplanar, multiecho pulse sequences of the brain and surrounding structures were obtained without intravenous contrast. Angiographic images of the intracranial arterial and venous structures were obtained using MRV technique without intravenous contrast. COMPARISON:  PRIOR CT from FINDINGS: MRI BRAIN FINDINGS: Cerebral volume normal for patient age. No focal parenchymal signal abnormality. No abnormal foci of restricted diffusion to suggest acute intracranial infarct. Gray-white matter differentiation well maintained. Major intracranial vascular flow voids preserved. No areas of chronic infarction. No acute or chronic intracranial hemorrhage. No mass lesion, midline shift, or mass effect. No hydrocephalus. No extra-axial fluid collection.  Cerebellar tonsils are low lying positioned up to 9 mm 5 mm below the foramen magnum, consistent with cerebellar tonsillar ectopia. No frank Chiari malformation identified. Visualized upper cervical spine within normal limits. Pituitary gland normal.  No acute abnormality about the orbits. Mild mucosal thickening within the paranasal sinuses. No air-fluid levels to suggest active sinus infection. No mastoid effusion. Inner ear structures grossly normal. Bone marrow signal intensity within normal limits. No scalp soft tissue abnormality. MRA HEAD FINDINGS: ANTERIOR CIRCULATION: Visualized distal cervical segments of the internal carotid arteries are widely patent with antegrade flow. Petrous, cavernous, and supraclinoid segments widely patent. A1 segments, anterior communicating artery, and anterior cerebral arteries are well opacified. M1 segments widely patent without stenosis or occlusion. MCA bifurcations normal. Distal MCA branches well opacified and symmetric. POSTERIOR CIRCULATION: Right vertebral artery dominant and widely patent to the vertebrobasilar junction. Diminutive left vertebral artery terminates in PICA. Basilar artery widely patent. Superior cerebral arteries patent bilaterally. Prominent right posterior communicating artery supplies the right posterior cerebral arteries. Left PCA arises from the basilar artery. PCAs well opacified bilaterally. No aneurysm or vascular malformation. MRV HEAD FINDINGS: Dedicated MRV images demonstrate no filling defect to suggest venous sinus thrombosis. Specifically, the superior sagittal, transverse, and sigmoid sinuses are patent. Straight sinus, vein of Galen, and internal cerebral veins are patent. No definite dural sinus stenosis. IMPRESSION: MRI HEAD IMPRESSION: 1. Cerebellar tonsillar ectopia with the cerebellar tonsils positioned up to 5 mm below the foramen magnum. No frank Chiari  1 malformation. 2. Otherwise normal brain MRI. MRA HEAD IMPRESSION: Normal  intracranial MRA. MRV HEAD IMPRESSION: Normal intracranial MRV. Electronically Signed   By: Jeannine Boga M.D.   On: 05/30/2015 01:18    Scheduled Meds: . doxycycline (VIBRAMYCIN) IV  200 mg Intravenous Q12H  . guaiFENesin  600 mg Oral BID  . lactated ringers   Intravenous STAT  . sodium chloride flush  3 mL Intravenous Q12H    Continuous Infusions: . sodium chloride 125 mL/hr at 05/30/15 0149       Time spent: [place], MD [facility] Pager [phone]  If 7PM-7AM, please contact night-coverage [email or web address] Password [email or web address] 05/30/2015, 8:35 AM

## 2015-05-30 NOTE — Consult Note (Signed)
Date of Admission:  05/29/2015  Date of Consult:  05/30/2015  Reason for Consult: leukocytosis of unknown origin, HA, N, V, D, concern for tick bite Referring Physician: Dr. Hollice Gong   HPI:  [name] is an 30 y.o. female with past medical history significant for uterine fibroids status post hysterectomy, bacterial vaginosis who noticed a erythematous lesion on her hand now roughly 13 days ago. Is intensely pruritic and had some crops of vesicles around it. It then had an expanding erythematous "halo" which evolved around it. She had absolutely no fevers or systemic symptoms beyond having this rash and intense itching. 3 days after the onset of the rash she was seen at an urgent care and prescribed both oral doxycycline and Decadron. She took these for the ensuing 6 days. She then had been doing well until May 3 when she developed severe pain involving her "entire spine from the lower neck to the lower back with a headache. She also developed swelling in her hands and feet that then resolved additionally she had some blurry vision though largely peripherally. She underwent a lumbar puncture which was completely benign.  Serologies were sent at that time for Lyme [facility] spotted fever and Ehrlichia and these were all negative. There had been initially some confusion as to if she had a lateral gaze palsy but that was never the case. I had recommended sending her home on doxycline when called by [facility] Dr. Jerilee Hoh (thogh this was "overkill" I believe). Regardless she never was able to upper prescription is due to a meniscal indication at never started the doxycycline in the interim she developed nausea vomiting along with loose stools with nausea and vomiting. She came to the emerge department at [facility] and has been admitted here. She has been started on IV doxycycline her diarrhea is resolving her nausea persists.  She had fairly substantial leukocytosis on admission here  on the 5th to 24.8k which has improved.  Her resp panel is + for rhniovirus/enterovirus. Otherwis workup has been negative.  She has never had a fever but still has intense pain in her head. She had MRI of the brain that was normal as were her MRA and MRV brain       Past Medical History  Diagnosis Date  . Hypoglycemia   . Vaginal discharge 03/18/2014  . Vaginal itching 03/18/2014  . BV (bacterial vaginosis) 03/18/2014  . Breast nodule 03/25/2014    Past Surgical History  Procedure Laterality Date  . Cesarean section  2009, 2012    x2  . Tonsillectomy    . Tubal ligation    . Mass excision N/A 07/23/2012    Procedure: EXCISION OF RIGHT LOWER ABDOMINAL MASS, ENDOMETRIAL IMPLANT;  Surgeon: Scherry Ran, MD;  Location: [facility];  Service: General;  Laterality: N/A;  . Supracervical abdominal hysterectomy N/A 02/19/2013    Procedure: HYSTERECTOMY SUPRACERVICAL ABDOMINAL;  Surgeon: [name], MD;  Location: [facility];  Service: Gynecology;  Laterality: N/A;  . Scar revision N/A 02/19/2013    Procedure: EXCISION OF SCAR ENDOMETRIOSIS;  Surgeon: [name], MD;  Location: [facility];  Service: Gynecology;  Laterality: N/A;  . Lysis of adhesion N/A 02/19/2013    Procedure: LYSIS OF ADHESION;  Surgeon: [name], MD;  Location: [facility];  Service: Gynecology;  Laterality: N/A;  . Abdominal hysterectomy      Social History:  reports that she has never smoked. She has never used  smokeless tobacco. She reports that she does not drink alcohol or use illicit drugs.   Family History  Problem Relation Age of Onset  . Hypertension Father   . Diabetes Other   . Heart disease Other   . Colon cancer Other   . Heart disease Other     Allergies  Allergen Reactions  . Codeine Hives and Itching     Medications: I have reviewed patients current medications as documented in Epic Anti-infectives    Start     Dose/Rate Route Frequency Ordered Stop   05/30/15 1000  doxycycline  (VIBRA-TABS) tablet 200 mg  Status:  Discontinued     200 mg Oral Every 12 hours 05/30/15 0851 05/30/15 1209   05/30/15 0000  doxycycline (VIBRAMYCIN) 200 mg in dextrose 5 % 250 mL IVPB  Status:  Discontinued     200 mg 125 mL/hr over 120 Minutes Intravenous Every 12 hours 05/29/15 2319 05/30/15 0851         ROS: as in HPI otherwise remainder of 12 point Review of Systems is negative   Blood pressure 106/56, pulse 80, temperature 97.8 F (36.6 C), temperature source Oral, resp. rate 18, height '5\' 2"'  (1.575 m), weight 212 lb 15.4 oz (96.6 kg), last menstrual period 01/08/2013, SpO2 97 %. General: Alert and awake, oriented x3, not in any acute distress. HEENT: anicteric sclera,  EOMI, oropharynx clear and without exudate Cardiovascular: egular rate, normal r,  no murmur rubs or gallops Pulmonary: clear to auscultation bilaterally, no wheezing, rales or rhonchi Gastrointestinal: soft nontender, nondistended, normal bowel sounds, Musculoskeletal: no  clubbing or edema noted bilaterally Skin, soft tissue: remnant of original "bite" is present Neuro: nonfocal, strength and sensation intact   Results for orders placed or performed during the hospital encounter of 05/29/15 (from the past 48 hour(s))  Lipase, blood     Status: None   Collection Time: 05/29/15 11:39 AM  Result Value Ref Range   Lipase 30 11 - 51 U/L  Comprehensive metabolic panel     Status: None   Collection Time: 05/29/15 11:39 AM  Result Value Ref Range   Sodium 138 135 - 145 mmol/L   Potassium 4.0 3.5 - 5.1 mmol/L   Chloride 102 101 - 111 mmol/L   CO2 24 22 - 32 mmol/L   Glucose, Bld 95 65 - 99 mg/dL   BUN 10 6 - 20 mg/dL   Creatinine, Ser 0.87 0.44 - 1.00 mg/dL   Calcium 9.3 8.9 - 10.3 mg/dL   Total Protein 6.7 6.5 - 8.1 g/dL   Albumin 3.6 3.5 - 5.0 g/dL   AST 19 15 - 41 U/L   ALT 25 14 - 54 U/L   Alkaline Phosphatase 68 38 - 126 U/L   Total Bilirubin 0.7 0.3 - 1.2 mg/dL   GFR calc non Af Amer >60 >60  mL/min   GFR calc Af Amer >60 >60 mL/min    Comment: (NOTE) The eGFR has been calculated using the CKD EPI equation. This calculation has not been validated in all clinical situations. eGFR's persistently <60 mL/min signify possible Chronic Kidney Disease.    Anion gap 12 5 - 15  CBC     Status: Abnormal   Collection Time: 05/29/15 11:39 AM  Result Value Ref Range   WBC 24.8 (H) 4.0 - 10.5 K/uL    Comment: WHITE COUNT CONFIRMED ON SMEAR   RBC 5.64 (H) 3.87 - 5.11 MIL/uL   Hemoglobin 16.9 (H) 12.0 - 15.0 g/dL  HCT 49.2 (H) 36.0 - 46.0 %   MCV 87.2 78.0 - 100.0 fL   MCH 30.0 26.0 - 34.0 pg   MCHC 34.3 30.0 - 36.0 g/dL   RDW 13.3 11.5 - 15.5 %   Platelets 266 150 - 400 K/uL  CBC with Differential/Platelet     Status: Abnormal   Collection Time: 05/29/15  2:10 PM  Result Value Ref Range   WBC 22.0 (H) 4.0 - 10.5 K/uL   RBC 5.36 (H) 3.87 - 5.11 MIL/uL   Hemoglobin 15.9 (H) 12.0 - 15.0 g/dL   HCT 47.3 (H) 36.0 - 46.0 %   MCV 88.2 78.0 - 100.0 fL   MCH 29.7 26.0 - 34.0 pg   MCHC 33.6 30.0 - 36.0 g/dL   RDW 13.0 11.5 - 15.5 %   Platelets 218 150 - 400 K/uL   Neutrophils Relative % 93 %   Neutro Abs 20.5 (H) 1.7 - 7.7 K/uL   Lymphocytes Relative 3 %   Lymphs Abs 0.6 (L) 0.7 - 4.0 K/uL   Monocytes Relative 4 %   Monocytes Absolute 0.9 0.1 - 1.0 K/uL   Eosinophils Relative 0 %   Eosinophils Absolute 0.0 0.0 - 0.7 K/uL   Basophils Relative 0 %   Basophils Absolute 0.0 0.0 - 0.1 K/uL  I-Stat CG4 Lactic Acid, ED     Status: None   Collection Time: 05/29/15  2:21 PM  Result Value Ref Range   Lactic Acid, Venous 0.91 0.5 - 2.0 mmol/L  Urinalysis, Routine w reflex microscopic     Status: None   Collection Time: 05/29/15  2:45 PM  Result Value Ref Range   Color, Urine YELLOW YELLOW   APPearance CLEAR CLEAR   Specific Gravity, Urine 1.007 1.005 - 1.030   pH 7.0 5.0 - 8.0   Glucose, UA NEGATIVE NEGATIVE mg/dL   Hgb urine dipstick NEGATIVE NEGATIVE   Bilirubin Urine NEGATIVE  NEGATIVE   Ketones, ur NEGATIVE NEGATIVE mg/dL   Protein, ur NEGATIVE NEGATIVE mg/dL   Nitrite NEGATIVE NEGATIVE   Leukocytes, UA NEGATIVE NEGATIVE    Comment: MICROSCOPIC NOT DONE ON URINES WITH NEGATIVE PROTEIN, BLOOD, LEUKOCYTES, NITRITE, OR GLUCOSE <1000 mg/dL.  Influenza panel by PCR (type A & B, H1N1)     Status: None   Collection Time: 05/29/15  6:01 PM  Result Value Ref Range   Influenza A By PCR NEGATIVE NEGATIVE   Influenza B By PCR NEGATIVE NEGATIVE   H1N1 flu by pcr NOT DETECTED NOT DETECTED    Comment:        The Xpert Flu assay (FDA approved for nasal aspirates or washes and nasopharyngeal swab specimens), is intended as an aid in the diagnosis of influenza and should not be used as a sole basis for treatment.   Lactic acid, plasma     Status: None   Collection Time: 05/30/15 12:00 AM  Result Value Ref Range   Lactic Acid, Venous 1.8 0.5 - 2.0 mmol/L  Procalcitonin     Status: None   Collection Time: 05/30/15 12:00 AM  Result Value Ref Range   Procalcitonin 0.10 ng/mL    Comment:        Interpretation: PCT (Procalcitonin) <= 0.5 ng/mL: Systemic infection (sepsis) is not likely. Local bacterial infection is possible. (NOTE)         ICU PCT Algorithm               Non ICU PCT Algorithm    ----------------------------     ------------------------------  PCT < 0.25 ng/mL                 PCT < 0.1 ng/mL     Stopping of antibiotics            Stopping of antibiotics       strongly encouraged.               strongly encouraged.    ----------------------------     ------------------------------       PCT level decrease by               PCT < 0.25 ng/mL       >= 80% from peak PCT       OR PCT 0.25 - 0.5 ng/mL          Stopping of antibiotics                                             encouraged.     Stopping of antibiotics           encouraged.    ----------------------------     ------------------------------       PCT level decrease by              PCT >=  0.25 ng/mL       < 80% from peak PCT        AND PCT >= 0.5 ng/mL            Continuin g antibiotics                                              encouraged.       Continuing antibiotics            encouraged.    ----------------------------     ------------------------------     PCT level increase compared          PCT > 0.5 ng/mL         with peak PCT AND          PCT >= 0.5 ng/mL             Escalation of antibiotics                                          strongly encouraged.      Escalation of antibiotics        strongly encouraged.   Protime-INR     Status: None   Collection Time: 05/30/15 12:00 AM  Result Value Ref Range   Prothrombin Time 14.2 11.6 - 15.2 seconds   INR 1.08 0.00 - 1.49  APTT     Status: None   Collection Time: 05/30/15 12:00 AM  Result Value Ref Range   aPTT 29 24 - 37 seconds  Respiratory Panel by PCR     Status: Abnormal   Collection Time: 05/30/15  3:32 AM  Result Value Ref Range   Adenovirus NOT DETECTED NOT DETECTED   Coronavirus 229E NOT DETECTED NOT DETECTED   Coronavirus HKU1 NOT DETECTED NOT DETECTED   Coronavirus NL63 NOT DETECTED NOT DETECTED   Coronavirus OC43 NOT DETECTED  NOT DETECTED   Metapneumovirus NOT DETECTED NOT DETECTED   Rhinovirus / Enterovirus DETECTED (A) NOT DETECTED   Influenza A NOT DETECTED NOT DETECTED   Influenza A H1 NOT DETECTED NOT DETECTED   Influenza A H1 2009 NOT DETECTED NOT DETECTED   Influenza A H3 NOT DETECTED NOT DETECTED   Influenza B NOT DETECTED NOT DETECTED   Parainfluenza Virus 1 NOT DETECTED NOT DETECTED   Parainfluenza Virus 2 NOT DETECTED NOT DETECTED   Parainfluenza Virus 3 NOT DETECTED NOT DETECTED   Parainfluenza Virus 4 NOT DETECTED NOT DETECTED   Respiratory Syncytial Virus NOT DETECTED NOT DETECTED   Bordetella pertussis NOT DETECTED NOT DETECTED   Chlamydophila pneumoniae NOT DETECTED NOT DETECTED   Mycoplasma pneumoniae NOT DETECTED NOT DETECTED  Lactic acid, plasma     Status: None    Collection Time: 05/30/15  4:24 AM  Result Value Ref Range   Lactic Acid, Venous 0.9 0.5 - 2.0 mmol/L  Basic metabolic panel     Status: Abnormal   Collection Time: 05/30/15  4:24 AM  Result Value Ref Range   Sodium 138 135 - 145 mmol/L   Potassium 4.2 3.5 - 5.1 mmol/L   Chloride 104 101 - 111 mmol/L   CO2 25 22 - 32 mmol/L   Glucose, Bld 108 (H) 65 - 99 mg/dL   BUN 8 6 - 20 mg/dL   Creatinine, Ser 0.94 0.44 - 1.00 mg/dL   Calcium 8.6 (L) 8.9 - 10.3 mg/dL   GFR calc non Af Amer >60 >60 mL/min   GFR calc Af Amer >60 >60 mL/min    Comment: (NOTE) The eGFR has been calculated using the CKD EPI equation. This calculation has not been validated in all clinical situations. eGFR's persistently <60 mL/min signify possible Chronic Kidney Disease.    Anion gap 9 5 - 15  CBC     Status: Abnormal   Collection Time: 05/30/15  4:24 AM  Result Value Ref Range   WBC 14.3 (H) 4.0 - 10.5 K/uL   RBC 4.85 3.87 - 5.11 MIL/uL   Hemoglobin 14.3 12.0 - 15.0 g/dL   HCT 42.7 36.0 - 46.0 %   MCV 88.0 78.0 - 100.0 fL   MCH 29.5 26.0 - 34.0 pg   MCHC 33.5 30.0 - 36.0 g/dL   RDW 13.3 11.5 - 15.5 %   Platelets 197 150 - 400 K/uL  Glucose, capillary     Status: None   Collection Time: 05/30/15  8:04 AM  Result Value Ref Range   Glucose-Capillary 85 65 - 99 mg/dL  Rapid strep screen (not at [facility])     Status: None   Collection Time: 05/30/15  9:56 AM  Result Value Ref Range   Streptococcus, Group A Screen (Direct) NEGATIVE NEGATIVE    Comment: (NOTE) A Rapid Antigen test may result negative if the antigen level in the sample is below the detection level of this test. The FDA has not cleared this test as a stand-alone test therefore the rapid antigen negative result has reflexed to a Group A Strep culture.    '@BRIEFLABTABLE' (sdes,specrequest,cult,reptstatus)   ) Recent Results (from the past 720 hour(s))  CSF culture     Status: None (Preliminary result)   Collection Time: 05/27/15  3:08 PM    Result Value Ref Range Status   Specimen Description CSF  Final   Special Requests NONE  Final   Gram Stain   Final    CYTOSPIN SMEAR WBC PRESENT, PREDOMINANTLY MONONUCLEAR  FUSIFORM NO ORGANISMS SEEN IN PAIRS CORRECTED RESULTS WBC PRESENT, PREDOMINANTLY MONONUCLEAR NO ORGANISMS SEEN CYTOSPIN SMEAR CORRECTED RESULTS CALLED TO: L. DAVIS,RN AT [id number] ON [id number] BY Rhea Bleacher    Culture   Final    NO GROWTH 3 DAYS Performed at [facility]    Report Status PENDING  Incomplete  Respiratory Panel by PCR     Status: Abnormal   Collection Time: 05/30/15  3:32 AM  Result Value Ref Range Status   Adenovirus NOT DETECTED NOT DETECTED Final   Coronavirus 229E NOT DETECTED NOT DETECTED Final   Coronavirus HKU1 NOT DETECTED NOT DETECTED Final   Coronavirus NL63 NOT DETECTED NOT DETECTED Final   Coronavirus OC43 NOT DETECTED NOT DETECTED Final   Metapneumovirus NOT DETECTED NOT DETECTED Final   Rhinovirus / Enterovirus DETECTED (A) NOT DETECTED Final   Influenza A NOT DETECTED NOT DETECTED Final   Influenza A H1 NOT DETECTED NOT DETECTED Final   Influenza A H1 2009 NOT DETECTED NOT DETECTED Final   Influenza A H3 NOT DETECTED NOT DETECTED Final   Influenza B NOT DETECTED NOT DETECTED Final   Parainfluenza Virus 1 NOT DETECTED NOT DETECTED Final   Parainfluenza Virus 2 NOT DETECTED NOT DETECTED Final   Parainfluenza Virus 3 NOT DETECTED NOT DETECTED Final   Parainfluenza Virus 4 NOT DETECTED NOT DETECTED Final   Respiratory Syncytial Virus NOT DETECTED NOT DETECTED Final   Bordetella pertussis NOT DETECTED NOT DETECTED Final   Chlamydophila pneumoniae NOT DETECTED NOT DETECTED Final   Mycoplasma pneumoniae NOT DETECTED NOT DETECTED Final  Rapid strep screen (not at [facility])     Status: None   Collection Time: 05/30/15  9:56 AM  Result Value Ref Range Status   Streptococcus, Group A Screen (Direct) NEGATIVE NEGATIVE Final    Comment: (NOTE) A Rapid Antigen test may result negative if  the antigen level in the sample is below the detection level of this test. The FDA has not cleared this test as a stand-alone test therefore the rapid antigen negative result has reflexed to a Group A Strep culture.      Impression/Recommendation  Principal Problem:   Headache Active Problems:   Leukocytosis   Sepsis (HCC)   Nausea, vomiting and diarrhea   [name] is a 30 y.o. female with  Possible insect versus tick bite with a migrating rash that had been concerning for STARI vs Lyme (though no fever to indiate the latter) sp doxy and decadron then admission to [facility] with headache but benign LP, now with N, V, D, HA and high wbc  #1 Leukocytosis of unkown cause: --will check CT chest abdomen and pelvis --dc doxycline --observe  --followup blood cultures --consider MRI C spine but would hold off for now  #2 ? Lyme vs STARI: does NOT fit for Lyme given absence of fevers and not being in a very endemic region. Her Lyme titers were -10 days into her illness. Her current condition does not fit well for infection with Borrelia, RMSF would have long since been treated with 6 days of doxy and Ehrlichia should have responded as well.  There is no known rx for STARI and not even known if an organism is involved only that it is associated with bite of [place] Tick.   05/30/2015, 3:11 PM   Thank you so much for this interesting consult  [facility] [place] [phone] (pager) [phone] (office) 05/30/2015, 3:11 PM  Alcide Evener  05/30/2015, 3:11 PM

## 2015-05-31 ENCOUNTER — Encounter (HOSPITAL_COMMUNITY): Payer: Self-pay | Admitting: Radiology

## 2015-05-31 LAB — CBC WITH DIFFERENTIAL/PLATELET
BASOS ABS: 0 10*3/uL (ref 0.0–0.1)
Basophils Relative: 0 %
EOS ABS: 0.1 10*3/uL (ref 0.0–0.7)
EOS PCT: 1 %
HCT: 42.8 % (ref 36.0–46.0)
Hemoglobin: 14.1 g/dL (ref 12.0–15.0)
LYMPHS ABS: 1.2 10*3/uL (ref 0.7–4.0)
LYMPHS PCT: 16 %
MCH: 28.8 pg (ref 26.0–34.0)
MCHC: 32.9 g/dL (ref 30.0–36.0)
MCV: 87.3 fL (ref 78.0–100.0)
MONO ABS: 0.6 10*3/uL (ref 0.1–1.0)
Monocytes Relative: 8 %
Neutro Abs: 5.6 10*3/uL (ref 1.7–7.7)
Neutrophils Relative %: 75 %
PLATELETS: 189 10*3/uL (ref 150–400)
RBC: 4.9 MIL/uL (ref 3.87–5.11)
RDW: 13.2 % (ref 11.5–15.5)
WBC: 7.5 10*3/uL (ref 4.0–10.5)

## 2015-05-31 LAB — CSF CULTURE: CULTURE: NO GROWTH

## 2015-05-31 LAB — CSF CULTURE W GRAM STAIN

## 2015-05-31 LAB — GLUCOSE, CAPILLARY: GLUCOSE-CAPILLARY: 74 mg/dL (ref 65–99)

## 2015-05-31 LAB — RHEUMATOID FACTOR

## 2015-05-31 MED ORDER — ONDANSETRON HCL 4 MG PO TABS: 4.0000 mg | ORAL_TABLET | Freq: Three times a day (TID) | ORAL | Status: DC | PRN

## 2015-05-31 NOTE — Discharge Summary (Signed)
Discharge Summary  [name] [id number] DOB: 1985/07/27  PCP: Collene Mares, PA-C  Admit date: 05/29/2015 Discharge date: 05/31/2015   Recommendations for Outpatient Follow-up:  1. PCP 2-4 weeks   Discharge Diagnoses:  Active Hospital Problems   Diagnosis Date Noted  . Headache 05/29/2015  . Rash and nonspecific skin eruption   . Sepsis ([place]) 05/29/2015  . Nausea, vomiting and diarrhea 05/29/2015  . Leukocytosis 05/28/2015    Resolved Hospital Problems   Diagnosis Date Noted Date Resolved  No resolved problems to display.    Discharge Condition: Stable   Diet recommendation: Regular   Filed Vitals:   05/31/15 0600 05/31/15 0908  BP: 128/60 126/72  Pulse: 84 75  Temp: 98.9 F (37.2 C) 97.7 F (36.5 C)  Resp: 19     History of present illness:  30 y.o. female with medical history significant of bacterial vaginosis, recently discharged from [facility] after [facility] for spinal pain and headache after starting doxycycline for a tick bite who presented to this hospital 36 hours after [facility] discharge with headache, spine pain, nausea, vomiting, diarrhea, cough. She was admitted, started on IV doxy and doing better now.   Hospital Course:  Principal Problem:   Headache Active Problems:   Leukocytosis   Sepsis ([facility])   Nausea, vomiting and diarrhea   Rash and nonspecific skin eruption  She was seen by ID Dr. Tommy Medal who recommended holding abx and observing. He also obtained CT with contrast of the chest, abdomen and pelvis which was not revealing. He recommends home today without abx. Pt is doing well and agreeable to this plan.   Procedures:  CT C/A/P 5/6   Consultations:  ID Dr. Tommy Medal   Discharge Exam: BP 126/72 mmHg  Pulse 75  Temp(Src) 97.7 F (36.5 C) (Oral)  Resp 19  Ht 5\' 2"  (1.575 m)  Wt 103.8 kg (228 lb 13.4 oz)  BMI 41.84 kg/m2  SpO2 100%  LMP 01/08/2013 General:  Alert, oriented, calm, in no acute distress  Eyes: pupils round and reactive to light  and accomodation, clear sclerea Neck: supple, no masses, trachea mildline  Cardiovascular: RRR, no murmurs or rubs, no peripheral edema  Respiratory: clear to auscultation bilaterally, no wheezes, no crackles  Abdomen: soft, nontender, nondistended, normal bowel tones heard  Skin: dry, no rashes  Musculoskeletal: no joint effusions, normal range of motion  Psychiatric: appropriate affect, normal speech  Neurologic: extraocular muscles intact, clear speech, moving all extremities with intact sensorium    Discharge Instructions You were cared for by a hospitalist during your hospital stay. If you have any questions about your discharge medications or the care you received while you were in the hospital after you are discharged, you can call the unit and asked to speak with the hospitalist on call if the hospitalist that took care of you is not available. Once you are discharged, your primary care physician will handle any further medical issues. Please note that NO REFILLS for any discharge medications will be authorized once you are discharged, as it is imperative that you return to your primary care physician (or establish a relationship with a primary care physician if you do not have one) for your aftercare needs so that they can reassess your need for medications and monitor your lab values.     Medication List    STOP taking these medications        doxycycline 100 MG EC tablet  Commonly known as:  DORYX  TAKE these medications        cyclobenzaprine 10 MG tablet  Commonly known as:  FLEXERIL  Take 1 tablet (10 mg total) by mouth 2 (two) times daily as needed for muscle spasms.     ibuprofen 200 MG tablet  Commonly known as:  ADVIL,MOTRIN  Take 200-400 mg by mouth every 6 (six) hours as needed for mild pain.     naproxen 500 MG tablet  Commonly known as:  NAPROSYN  Take 1 tablet (500 mg total) by mouth 2 (two) times daily with a meal.     ondansetron 4 MG tablet    Commonly known as:  ZOFRAN  Take 1 tablet (4 mg total) by mouth every 8 (eight) hours as needed for nausea or vomiting.       Allergies  Allergen Reactions  . Codeine Hives and Itching       Follow-up Information    Follow up with Collene Mares, PA-C On 06/01/2015.   Specialties:  Physician Assistant, Internal Medicine   Contact information:   [address] [place] [id number] [phone]        The results of significant diagnostics from this hospitalization (including imaging, microbiology, ancillary and laboratory) are listed below for reference.    Significant Diagnostic Studies: Dg Chest 2 View  05/29/2015  CLINICAL DATA:  Initial evaluation for acute leukocytosis. EXAM: CHEST  2 VIEW COMPARISON:  None. FINDINGS: The heart size and mediastinal contours are within normal limits. Both lungs are clear. The visualized skeletal structures are unremarkable. IMPRESSION: No active cardiopulmonary disease. Electronically Signed   By: Jeannine Boga M.D.   On: 05/29/2015 23:25   Ct Head Wo Contrast  05/27/2015  CLINICAL DATA:  Left seen for spider bite generalize weakness. Blurry vision. EXAM: CT HEAD WITHOUT CONTRAST TECHNIQUE: Contiguous axial images were obtained from the base of the skull through the vertex without intravenous contrast. COMPARISON:  None. FINDINGS: No acute cortical infarct, hemorrhage, or mass lesion ispresent. Ventricles are of normal size. No significant extra-axial fluid collection is present. The paranasal sinuses andmastoid air cells are clear. The osseous skull is intact. IMPRESSION: Normal brain. Electronically Signed   By: Kerby Moors M.D.   On: 05/27/2015 13:47   Ct Chest W Contrast  05/31/2015  CLINICAL DATA:  Leukocytosis.  Nausea and vomiting. EXAM: CT CHEST, ABDOMEN, AND PELVIS WITH CONTRAST TECHNIQUE: Multidetector CT imaging of the chest, abdomen and pelvis was performed following the standard protocol during bolus administration of  intravenous contrast. CONTRAST:  128mL ISOVUE-300 IOPAMIDOL (ISOVUE-300) INJECTION 61% COMPARISON:  04/15/2014 FINDINGS: CT CHEST The lungs are clear. Airways are normal. No hilar or mediastinal adenopathy. Axillary regions are unremarkable. No effusions. Small hiatal hernia. Esophagus is unremarkable. CT ABDOMEN AND PELVIS There are normal appearances of the liver, gallbladder, bile ducts, pancreas, spleen, adrenals and kidneys. Ureters and urinary bladder are unremarkable. There are normal appearances of the stomach, small bowel and colon. The appendix is normal. The abdominal aorta is normal in caliber. There is no atherosclerotic calcification. There is no adenopathy in the abdomen or pelvis. Both ovaries are normal. There appears to be a supracervical hysterectomy, with unremarkable appearances of the cervical remnant. No acute inflammatory changes are evident in the abdomen or pelvis. There is no ascites. There is no extraluminal air. There is no significant musculoskeletal abnormality. IMPRESSION: No acute findings are evident in the chest, abdomen or pelvis. Small hiatal hernia. Electronically Signed   By: Andreas Newport M.D.   On:  05/31/2015 00:53   Mr Angiogram Head Wo Contrast  05/30/2015  CLINICAL DATA:  Initial evaluation for persistent headache, neck stiffness, nausea, vomiting. EXAM: MRI HEAD WITHOUT CONTRAST MRA HEAD WITHOUT CONTRAST MRV HEAD WITHOUT CONTRAST TECHNIQUE: Multiplanar, multiecho pulse sequences of the brain and surrounding structures were obtained without intravenous contrast. Angiographic images of the intracranial arterial and venous structures were obtained using MRV technique without intravenous contrast. COMPARISON:  PRIOR CT from FINDINGS: MRI BRAIN FINDINGS: Cerebral volume normal for patient age. No focal parenchymal signal abnormality. No abnormal foci of restricted diffusion to suggest acute intracranial infarct. Gray-white matter differentiation well maintained. Major  intracranial vascular flow voids preserved. No areas of chronic infarction. No acute or chronic intracranial hemorrhage. No mass lesion, midline shift, or mass effect. No hydrocephalus. No extra-axial fluid collection. Cerebellar tonsils are low lying positioned up to 9 mm 5 mm below the foramen magnum, consistent with cerebellar tonsillar ectopia. No frank Chiari malformation identified. Visualized upper cervical spine within normal limits. Pituitary gland normal.  No acute abnormality about the orbits. Mild mucosal thickening within the paranasal sinuses. No air-fluid levels to suggest active sinus infection. No mastoid effusion. Inner ear structures grossly normal. Bone marrow signal intensity within normal limits. No scalp soft tissue abnormality. MRA HEAD FINDINGS: ANTERIOR CIRCULATION: Visualized distal cervical segments of the internal carotid arteries are widely patent with antegrade flow. Petrous, cavernous, and supraclinoid segments widely patent. A1 segments, anterior communicating artery, and anterior cerebral arteries are well opacified. M1 segments widely patent without stenosis or occlusion. MCA bifurcations normal. Distal MCA branches well opacified and symmetric. POSTERIOR CIRCULATION: Right vertebral artery dominant and widely patent to the vertebrobasilar junction. Diminutive left vertebral artery terminates in PICA. Basilar artery widely patent. Superior cerebral arteries patent bilaterally. Prominent right posterior communicating artery supplies the right posterior cerebral arteries. Left PCA arises from the basilar artery. PCAs well opacified bilaterally. No aneurysm or vascular malformation. MRV HEAD FINDINGS: Dedicated MRV images demonstrate no filling defect to suggest venous sinus thrombosis. Specifically, the superior sagittal, transverse, and sigmoid sinuses are patent. Straight sinus, vein of Galen, and internal cerebral veins are patent. No definite dural sinus stenosis. IMPRESSION: MRI  HEAD IMPRESSION: 1. Cerebellar tonsillar ectopia with the cerebellar tonsils positioned up to 5 mm below the foramen magnum. No frank Chiari 1 malformation. 2. Otherwise normal brain MRI. MRA HEAD IMPRESSION: Normal intracranial MRA. MRV HEAD IMPRESSION: Normal intracranial MRV. Electronically Signed   By: Jeannine Boga M.D.   On: 05/30/2015 01:18   Mr Brain Wo Contrast  05/30/2015  CLINICAL DATA:  Initial evaluation for persistent headache, neck stiffness, nausea, vomiting. EXAM: MRI HEAD WITHOUT CONTRAST MRA HEAD WITHOUT CONTRAST MRV HEAD WITHOUT CONTRAST TECHNIQUE: Multiplanar, multiecho pulse sequences of the brain and surrounding structures were obtained without intravenous contrast. Angiographic images of the intracranial arterial and venous structures were obtained using MRV technique without intravenous contrast. COMPARISON:  PRIOR CT from FINDINGS: MRI BRAIN FINDINGS: Cerebral volume normal for patient age. No focal parenchymal signal abnormality. No abnormal foci of restricted diffusion to suggest acute intracranial infarct. Gray-white matter differentiation well maintained. Major intracranial vascular flow voids preserved. No areas of chronic infarction. No acute or chronic intracranial hemorrhage. No mass lesion, midline shift, or mass effect. No hydrocephalus. No extra-axial fluid collection. Cerebellar tonsils are low lying positioned up to 9 mm 5 mm below the foramen magnum, consistent with cerebellar tonsillar ectopia. No frank Chiari malformation identified. Visualized upper cervical spine within normal limits. Pituitary gland normal.  No  acute abnormality about the orbits. Mild mucosal thickening within the paranasal sinuses. No air-fluid levels to suggest active sinus infection. No mastoid effusion. Inner ear structures grossly normal. Bone marrow signal intensity within normal limits. No scalp soft tissue abnormality. MRA HEAD FINDINGS: ANTERIOR CIRCULATION: Visualized distal cervical  segments of the internal carotid arteries are widely patent with antegrade flow. Petrous, cavernous, and supraclinoid segments widely patent. A1 segments, anterior communicating artery, and anterior cerebral arteries are well opacified. M1 segments widely patent without stenosis or occlusion. MCA bifurcations normal. Distal MCA branches well opacified and symmetric. POSTERIOR CIRCULATION: Right vertebral artery dominant and widely patent to the vertebrobasilar junction. Diminutive left vertebral artery terminates in PICA. Basilar artery widely patent. Superior cerebral arteries patent bilaterally. Prominent right posterior communicating artery supplies the right posterior cerebral arteries. Left PCA arises from the basilar artery. PCAs well opacified bilaterally. No aneurysm or vascular malformation. MRV HEAD FINDINGS: Dedicated MRV images demonstrate no filling defect to suggest venous sinus thrombosis. Specifically, the superior sagittal, transverse, and sigmoid sinuses are patent. Straight sinus, vein of Galen, and internal cerebral veins are patent. No definite dural sinus stenosis. IMPRESSION: MRI HEAD IMPRESSION: 1. Cerebellar tonsillar ectopia with the cerebellar tonsils positioned up to 5 mm below the foramen magnum. No frank Chiari 1 malformation. 2. Otherwise normal brain MRI. MRA HEAD IMPRESSION: Normal intracranial MRA. MRV HEAD IMPRESSION: Normal intracranial MRV. Electronically Signed   By: Jeannine Boga M.D.   On: 05/30/2015 01:18   Mr Hilary Hertz  05/30/2015  CLINICAL DATA:  Initial evaluation for persistent headache, neck stiffness, nausea, vomiting. EXAM: MRI HEAD WITHOUT CONTRAST MRA HEAD WITHOUT CONTRAST MRV HEAD WITHOUT CONTRAST TECHNIQUE: Multiplanar, multiecho pulse sequences of the brain and surrounding structures were obtained without intravenous contrast. Angiographic images of the intracranial arterial and venous structures were obtained using MRV technique without intravenous  contrast. COMPARISON:  PRIOR CT from FINDINGS: MRI BRAIN FINDINGS: Cerebral volume normal for patient age. No focal parenchymal signal abnormality. No abnormal foci of restricted diffusion to suggest acute intracranial infarct. Gray-white matter differentiation well maintained. Major intracranial vascular flow voids preserved. No areas of chronic infarction. No acute or chronic intracranial hemorrhage. No mass lesion, midline shift, or mass effect. No hydrocephalus. No extra-axial fluid collection. Cerebellar tonsils are low lying positioned up to 9 mm 5 mm below the foramen magnum, consistent with cerebellar tonsillar ectopia. No frank Chiari malformation identified. Visualized upper cervical spine within normal limits. Pituitary gland normal.  No acute abnormality about the orbits. Mild mucosal thickening within the paranasal sinuses. No air-fluid levels to suggest active sinus infection. No mastoid effusion. Inner ear structures grossly normal. Bone marrow signal intensity within normal limits. No scalp soft tissue abnormality. MRA HEAD FINDINGS: ANTERIOR CIRCULATION: Visualized distal cervical segments of the internal carotid arteries are widely patent with antegrade flow. Petrous, cavernous, and supraclinoid segments widely patent. A1 segments, anterior communicating artery, and anterior cerebral arteries are well opacified. M1 segments widely patent without stenosis or occlusion. MCA bifurcations normal. Distal MCA branches well opacified and symmetric. POSTERIOR CIRCULATION: Right vertebral artery dominant and widely patent to the vertebrobasilar junction. Diminutive left vertebral artery terminates in PICA. Basilar artery widely patent. Superior cerebral arteries patent bilaterally. Prominent right posterior communicating artery supplies the right posterior cerebral arteries. Left PCA arises from the basilar artery. PCAs well opacified bilaterally. No aneurysm or vascular malformation. MRV HEAD FINDINGS:  Dedicated MRV images demonstrate no filling defect to suggest venous sinus thrombosis. Specifically, the superior sagittal, transverse, and sigmoid  sinuses are patent. Straight sinus, vein of Galen, and internal cerebral veins are patent. No definite dural sinus stenosis. IMPRESSION: MRI HEAD IMPRESSION: 1. Cerebellar tonsillar ectopia with the cerebellar tonsils positioned up to 5 mm below the foramen magnum. No frank Chiari 1 malformation. 2. Otherwise normal brain MRI. MRA HEAD IMPRESSION: Normal intracranial MRA. MRV HEAD IMPRESSION: Normal intracranial MRV. Electronically Signed   By: Jeannine Boga M.D.   On: 05/30/2015 01:18   Ct Abdomen Pelvis W Contrast  05/31/2015  CLINICAL DATA:  Leukocytosis.  Nausea and vomiting. EXAM: CT CHEST, ABDOMEN, AND PELVIS WITH CONTRAST TECHNIQUE: Multidetector CT imaging of the chest, abdomen and pelvis was performed following the standard protocol during bolus administration of intravenous contrast. CONTRAST:  159mL ISOVUE-300 IOPAMIDOL (ISOVUE-300) INJECTION 61% COMPARISON:  04/15/2014 FINDINGS: CT CHEST The lungs are clear. Airways are normal. No hilar or mediastinal adenopathy. Axillary regions are unremarkable. No effusions. Small hiatal hernia. Esophagus is unremarkable. CT ABDOMEN AND PELVIS There are normal appearances of the liver, gallbladder, bile ducts, pancreas, spleen, adrenals and kidneys. Ureters and urinary bladder are unremarkable. There are normal appearances of the stomach, small bowel and colon. The appendix is normal. The abdominal aorta is normal in caliber. There is no atherosclerotic calcification. There is no adenopathy in the abdomen or pelvis. Both ovaries are normal. There appears to be a supracervical hysterectomy, with unremarkable appearances of the cervical remnant. No acute inflammatory changes are evident in the abdomen or pelvis. There is no ascites. There is no extraluminal air. There is no significant musculoskeletal abnormality.  IMPRESSION: No acute findings are evident in the chest, abdomen or pelvis. Small hiatal hernia. Electronically Signed   By: Andreas Newport M.D.   On: 05/31/2015 00:53   Dg Lumbar Puncture Fluoro Guide  05/27/2015  CLINICAL DATA:  Neck pain and stiffness question meningitis EXAM: DIAGNOSTIC LUMBAR PUNCTURE UNDER FLUOROSCOPIC GUIDANCE FLUOROSCOPY TIME:  Radiation Exposure Index (as provided by the fluoroscopic device): Not provided If the device does not provide the exposure index: Fluoroscopy Time (in minutes and seconds):  0 minutes 24 seconds Number of Acquired Images:  2 PROCEDURE: Procedure, benefits, and risks were discussed with the patient, including alternatives. Patient's questions were answered. Written informed consent was obtained. Timeout protocol followed. Patient placed prone. L4-L5 disc space was localized under fluoroscopy. Skin prepped and draped in usual sterile fashion. Skin and soft tissues anesthetized with 3 mL of 1% lidocaine. 22 gauge needle was advanced into the spinal canal where clear colorless CSF was encountered. Opening pressure was not assessed. 9 mL of CSF was obtained in 4 tubes for requested analysis. Procedure tolerated very well by patient without immediate complication. IMPRESSION: Successful lumbar puncture as above. Electronically Signed   By: Lavonia Dana M.D.   On: 05/27/2015 15:35    Microbiology: Recent Results (from the past 240 hour(s))  CSF culture     Status: None   Collection Time: 05/27/15  3:08 PM  Result Value Ref Range Status   Specimen Description CSF  Final   Special Requests NONE  Final   Gram Stain   Final    CYTOSPIN SMEAR WBC PRESENT, PREDOMINANTLY MONONUCLEAR FUSIFORM NO ORGANISMS SEEN IN PAIRS CORRECTED RESULTS WBC PRESENT, PREDOMINANTLY MONONUCLEAR NO ORGANISMS SEEN CYTOSPIN SMEAR CORRECTED RESULTS CALLED TO: L. DAVIS,RN AT [id number] ON [id number] BY Rhea Bleacher    Culture   Final    NO GROWTH 3 DAYS Performed at [facility]     Report Status 05/31/2015 FINAL  Final  Respiratory Panel by PCR     Status: Abnormal   Collection Time: 05/30/15  3:32 AM  Result Value Ref Range Status   Adenovirus NOT DETECTED NOT DETECTED Final   Coronavirus 229E NOT DETECTED NOT DETECTED Final   Coronavirus HKU1 NOT DETECTED NOT DETECTED Final   Coronavirus NL63 NOT DETECTED NOT DETECTED Final   Coronavirus OC43 NOT DETECTED NOT DETECTED Final   Metapneumovirus NOT DETECTED NOT DETECTED Final   Rhinovirus / Enterovirus DETECTED (A) NOT DETECTED Final   Influenza A NOT DETECTED NOT DETECTED Final   Influenza A H1 NOT DETECTED NOT DETECTED Final   Influenza A H1 2009 NOT DETECTED NOT DETECTED Final   Influenza A H3 NOT DETECTED NOT DETECTED Final   Influenza B NOT DETECTED NOT DETECTED Final   Parainfluenza Virus 1 NOT DETECTED NOT DETECTED Final   Parainfluenza Virus 2 NOT DETECTED NOT DETECTED Final   Parainfluenza Virus 3 NOT DETECTED NOT DETECTED Final   Parainfluenza Virus 4 NOT DETECTED NOT DETECTED Final   Respiratory Syncytial Virus NOT DETECTED NOT DETECTED Final   Bordetella pertussis NOT DETECTED NOT DETECTED Final   Chlamydophila pneumoniae NOT DETECTED NOT DETECTED Final   Mycoplasma pneumoniae NOT DETECTED NOT DETECTED Final  Rapid strep screen (not at [facility])     Status: None   Collection Time: 05/30/15  9:56 AM  Result Value Ref Range Status   Streptococcus, Group A Screen (Direct) NEGATIVE NEGATIVE Final    Comment: (NOTE) A Rapid Antigen test may result negative if the antigen level in the sample is below the detection level of this test. The FDA has not cleared this test as a stand-alone test therefore the rapid antigen negative result has reflexed to a Group A Strep culture.      Labs: Basic Metabolic Panel:  Recent Labs Lab 05/27/15 1013 05/28/15 0620 05/29/15 1139 05/30/15 0424  NA 139 140 138 138  K 4.1 4.0 4.0 4.2  CL 99* 106 102 104  CO2 31 28 24 25   GLUCOSE 89 123* 95 108*  BUN 15 17 10 8     CREATININE 0.94 1.00 0.87 0.94  CALCIUM 9.1 8.2* 9.3 8.6*   Liver Function Tests:  Recent Labs Lab 05/27/15 1013 05/28/15 0620 05/29/15 1139  AST 13* 12* 19  ALT 16 14 25   ALKPHOS 71 55 68  BILITOT 0.7 0.3 0.7  PROT 6.8 5.2* 6.7  ALBUMIN 3.7 2.7* 3.6    Recent Labs Lab 05/29/15 1139  LIPASE 30   No results for input(s): AMMONIA in the last 168 hours. CBC:  Recent Labs Lab 05/27/15 1013 05/28/15 0620 05/29/15 1139 05/29/15 1410 05/30/15 0424 05/31/15 1211  WBC 17.5* 13.6* 24.8* 22.0* 14.3* 7.5  NEUTROABS 11.7*  --   --  20.5*  --  5.6  HGB 17.1* 13.9 16.9* 15.9* 14.3 14.1  HCT 49.7* 41.8 49.2* 47.3* 42.7 42.8  MCV 85.5 87.6 87.2 88.2 88.0 87.3  PLT 264 204 266 218 197 189   Cardiac Enzymes: No results for input(s): CKTOTAL, CKMB, CKMBINDEX, TROPONINI in the last 168 hours. BNP: BNP (last 3 results) No results for input(s): BNP in the last 8760 hours.  ProBNP (last 3 results) No results for input(s): PROBNP in the last 8760 hours.  CBG:  Recent Labs Lab 05/30/15 0804 05/31/15 0734  GLUCAP 85 74    Time spent: 31 minutes were spent in preparing this discharge including medication reconciliation, counseling, and coordination of care.  Signed:  Tamera Pingley Marry Guan  [facility] 05/31/2015, 4:31 PM

## 2015-05-31 NOTE — Progress Notes (Signed)
Patient Discharge: Disposition: Patient discharged to home. Education: Reviewed all her medications, prescriptions, follow-up appointments and discharge instructions, understood and acknowledged. IV: Discontinued IV before discharged. Telemetry: Discontinued Tele before discharged, CCMD notified. Transportation: patient escorted out of the unit in w/c. Belongings: Patient took all her belongings with her.

## 2015-05-31 NOTE — Progress Notes (Signed)
Subjective: No new complaints   Antibiotics:  Anti-infectives    Start     Dose/Rate Route Frequency Ordered Stop   05/30/15 1000  doxycycline (VIBRA-TABS) tablet 200 mg  Status:  Discontinued     200 mg Oral Every 12 hours 05/30/15 0851 05/30/15 1209   05/30/15 0000  doxycycline (VIBRAMYCIN) 200 mg in dextrose 5 % 250 mL IVPB  Status:  Discontinued     200 mg 125 mL/hr over 120 Minutes Intravenous Every 12 hours 05/29/15 2319 05/30/15 0851      Medications: Scheduled Meds: . guaiFENesin  600 mg Oral BID  . sodium chloride flush  3 mL Intravenous Q12H   Continuous Infusions: . sodium chloride 125 mL/hr at 05/31/15 0358   PRN Meds:.acetaminophen **OR** acetaminophen, HYDROcodone-acetaminophen, ibuprofen, ondansetron (ZOFRAN) IV    Objective: Weight change: 28 lb 13.4 oz (13.081 kg)  Intake/Output Summary (Last 24 hours) at 05/31/15 1527 Last data filed at 05/31/15 0600  Gross per 24 hour  Intake   2100 ml  Output      0 ml  Net   2100 ml   Blood pressure 126/72, pulse 75, temperature 97.7 F (36.5 C), temperature source Oral, resp. rate 19, height 5\' 2"  (1.575 m), weight 228 lb 13.4 oz (103.8 kg), last menstrual period 01/08/2013, SpO2 100 %. Temp:  [97.7 F (36.5 C)-99.1 F (37.3 C)] 97.7 F (36.5 C) (05/07 0908) Pulse Rate:  [73-84] 75 (05/07 0908) Resp:  [19] 19 (05/07 0600) BP: (119-128)/(60-82) 126/72 mmHg (05/07 0908) SpO2:  [98 %-100 %] 100 % (05/07 0908) Weight:  [228 lb 13.4 oz (103.8 kg)] 228 lb 13.4 oz (103.8 kg) (05/06 2035)  Physical Exam: General: Alert and awake, oriented x3, not in any acute distress. patient ambulating around room in clothes says she feels much better Neuro: nonfocal  CBC:  CBC Latest Ref Rng 05/31/2015 05/30/2015 05/29/2015  WBC 4.0 - 10.5 K/uL 7.5 14.3(H) 22.0(H)  Hemoglobin 12.0 - 15.0 g/dL 14.1 14.3 15.9(H)  Hematocrit 36.0 - 46.0 % 42.8 42.7 47.3(H)  Platelets 150 - 400 K/uL 189 197 218        BMET  Recent Labs  05/29/15 1139 05/30/15 0424  NA 138 138  K 4.0 4.2  CL 102 104  CO2 24 25  GLUCOSE 95 108*  BUN 10 8  CREATININE 0.87 0.94  CALCIUM 9.3 8.6*     Liver Panel   Recent Labs  05/29/15 1139  PROT 6.7  ALBUMIN 3.6  AST 19  ALT 25  ALKPHOS 68  BILITOT 0.7       Sedimentation Rate No results for input(s): ESRSEDRATE in the last 72 hours. C-Reactive Protein No results for input(s): CRP in the last 72 hours.  Micro Results: Recent Results (from the past 720 hour(s))  CSF culture     Status: None   Collection Time: 05/27/15  3:08 PM  Result Value Ref Range Status   Specimen Description CSF  Final   Special Requests NONE  Final   Gram Stain   Final    CYTOSPIN SMEAR WBC PRESENT, PREDOMINANTLY MONONUCLEAR FUSIFORM NO ORGANISMS SEEN IN PAIRS CORRECTED RESULTS WBC PRESENT, PREDOMINANTLY MONONUCLEAR NO ORGANISMS SEEN CYTOSPIN SMEAR CORRECTED RESULTS CALLED TO: L. DAVIS,RN AT ID:[id number] ON UG:[id number] BY Rhea Bleacher    Culture   Final    NO GROWTH 3 DAYS Performed at [facility]    Report Status 05/31/2015 FINAL  Final  Respiratory Panel by PCR  Status: Abnormal   Collection Time: 05/30/15  3:32 AM  Result Value Ref Range Status   Adenovirus NOT DETECTED NOT DETECTED Final   Coronavirus 229E NOT DETECTED NOT DETECTED Final   Coronavirus HKU1 NOT DETECTED NOT DETECTED Final   Coronavirus NL63 NOT DETECTED NOT DETECTED Final   Coronavirus OC43 NOT DETECTED NOT DETECTED Final   Metapneumovirus NOT DETECTED NOT DETECTED Final   Rhinovirus / Enterovirus DETECTED (A) NOT DETECTED Final   Influenza A NOT DETECTED NOT DETECTED Final   Influenza A H1 NOT DETECTED NOT DETECTED Final   Influenza A H1 2009 NOT DETECTED NOT DETECTED Final   Influenza A H3 NOT DETECTED NOT DETECTED Final   Influenza B NOT DETECTED NOT DETECTED Final   Parainfluenza Virus 1 NOT DETECTED NOT DETECTED Final   Parainfluenza Virus 2 NOT DETECTED NOT DETECTED  Final   Parainfluenza Virus 3 NOT DETECTED NOT DETECTED Final   Parainfluenza Virus 4 NOT DETECTED NOT DETECTED Final   Respiratory Syncytial Virus NOT DETECTED NOT DETECTED Final   Bordetella pertussis NOT DETECTED NOT DETECTED Final   Chlamydophila pneumoniae NOT DETECTED NOT DETECTED Final   Mycoplasma pneumoniae NOT DETECTED NOT DETECTED Final  Rapid strep screen (not at [facility])     Status: None   Collection Time: 05/30/15  9:56 AM  Result Value Ref Range Status   Streptococcus, Group A Screen (Direct) NEGATIVE NEGATIVE Final    Comment: (NOTE) A Rapid Antigen test may result negative if the antigen level in the sample is below the detection level of this test. The FDA has not cleared this test as a stand-alone test therefore the rapid antigen negative result has reflexed to a Group A Strep culture.     Studies/Results: Dg Chest 2 View  05/29/2015  CLINICAL DATA:  Initial evaluation for acute leukocytosis. EXAM: CHEST  2 VIEW COMPARISON:  None. FINDINGS: The heart size and mediastinal contours are within normal limits. Both lungs are clear. The visualized skeletal structures are unremarkable. IMPRESSION: No active cardiopulmonary disease. Electronically Signed   By: Jeannine Boga M.D.   On: 05/29/2015 23:25   Ct Chest W Contrast  05/31/2015  CLINICAL DATA:  Leukocytosis.  Nausea and vomiting. EXAM: CT CHEST, ABDOMEN, AND PELVIS WITH CONTRAST TECHNIQUE: Multidetector CT imaging of the chest, abdomen and pelvis was performed following the standard protocol during bolus administration of intravenous contrast. CONTRAST:  173mL ISOVUE-300 IOPAMIDOL (ISOVUE-300) INJECTION 61% COMPARISON:  04/15/2014 FINDINGS: CT CHEST The lungs are clear. Airways are normal. No hilar or mediastinal adenopathy. Axillary regions are unremarkable. No effusions. Small hiatal hernia. Esophagus is unremarkable. CT ABDOMEN AND PELVIS There are normal appearances of the liver, gallbladder, bile ducts, pancreas,  spleen, adrenals and kidneys. Ureters and urinary bladder are unremarkable. There are normal appearances of the stomach, small bowel and colon. The appendix is normal. The abdominal aorta is normal in caliber. There is no atherosclerotic calcification. There is no adenopathy in the abdomen or pelvis. Both ovaries are normal. There appears to be a supracervical hysterectomy, with unremarkable appearances of the cervical remnant. No acute inflammatory changes are evident in the abdomen or pelvis. There is no ascites. There is no extraluminal air. There is no significant musculoskeletal abnormality. IMPRESSION: No acute findings are evident in the chest, abdomen or pelvis. Small hiatal hernia. Electronically Signed   By: Andreas Newport M.D.   On: 05/31/2015 00:53   Mr Angiogram Head Wo Contrast  05/30/2015  CLINICAL DATA:  Initial evaluation for persistent headache,  neck stiffness, nausea, vomiting. EXAM: MRI HEAD WITHOUT CONTRAST MRA HEAD WITHOUT CONTRAST MRV HEAD WITHOUT CONTRAST TECHNIQUE: Multiplanar, multiecho pulse sequences of the brain and surrounding structures were obtained without intravenous contrast. Angiographic images of the intracranial arterial and venous structures were obtained using MRV technique without intravenous contrast. COMPARISON:  PRIOR CT from FINDINGS: MRI BRAIN FINDINGS: Cerebral volume normal for patient age. No focal parenchymal signal abnormality. No abnormal foci of restricted diffusion to suggest acute intracranial infarct. Gray-white matter differentiation well maintained. Major intracranial vascular flow voids preserved. No areas of chronic infarction. No acute or chronic intracranial hemorrhage. No mass lesion, midline shift, or mass effect. No hydrocephalus. No extra-axial fluid collection. Cerebellar tonsils are low lying positioned up to 9 mm 5 mm below the foramen magnum, consistent with cerebellar tonsillar ectopia. No frank Chiari malformation identified. Visualized  upper cervical spine within normal limits. Pituitary gland normal.  No acute abnormality about the orbits. Mild mucosal thickening within the paranasal sinuses. No air-fluid levels to suggest active sinus infection. No mastoid effusion. Inner ear structures grossly normal. Bone marrow signal intensity within normal limits. No scalp soft tissue abnormality. MRA HEAD FINDINGS: ANTERIOR CIRCULATION: Visualized distal cervical segments of the internal carotid arteries are widely patent with antegrade flow. Petrous, cavernous, and supraclinoid segments widely patent. A1 segments, anterior communicating artery, and anterior cerebral arteries are well opacified. M1 segments widely patent without stenosis or occlusion. MCA bifurcations normal. Distal MCA branches well opacified and symmetric. POSTERIOR CIRCULATION: Right vertebral artery dominant and widely patent to the vertebrobasilar junction. Diminutive left vertebral artery terminates in PICA. Basilar artery widely patent. Superior cerebral arteries patent bilaterally. Prominent right posterior communicating artery supplies the right posterior cerebral arteries. Left PCA arises from the basilar artery. PCAs well opacified bilaterally. No aneurysm or vascular malformation. MRV HEAD FINDINGS: Dedicated MRV images demonstrate no filling defect to suggest venous sinus thrombosis. Specifically, the superior sagittal, transverse, and sigmoid sinuses are patent. Straight sinus, vein of Galen, and internal cerebral veins are patent. No definite dural sinus stenosis. IMPRESSION: MRI HEAD IMPRESSION: 1. Cerebellar tonsillar ectopia with the cerebellar tonsils positioned up to 5 mm below the foramen magnum. No frank Chiari 1 malformation. 2. Otherwise normal brain MRI. MRA HEAD IMPRESSION: Normal intracranial MRA. MRV HEAD IMPRESSION: Normal intracranial MRV. Electronically Signed   By: Jeannine Boga M.D.   On: 05/30/2015 01:18   Mr Brain Wo Contrast  05/30/2015   CLINICAL DATA:  Initial evaluation for persistent headache, neck stiffness, nausea, vomiting. EXAM: MRI HEAD WITHOUT CONTRAST MRA HEAD WITHOUT CONTRAST MRV HEAD WITHOUT CONTRAST TECHNIQUE: Multiplanar, multiecho pulse sequences of the brain and surrounding structures were obtained without intravenous contrast. Angiographic images of the intracranial arterial and venous structures were obtained using MRV technique without intravenous contrast. COMPARISON:  PRIOR CT from FINDINGS: MRI BRAIN FINDINGS: Cerebral volume normal for patient age. No focal parenchymal signal abnormality. No abnormal foci of restricted diffusion to suggest acute intracranial infarct. Gray-white matter differentiation well maintained. Major intracranial vascular flow voids preserved. No areas of chronic infarction. No acute or chronic intracranial hemorrhage. No mass lesion, midline shift, or mass effect. No hydrocephalus. No extra-axial fluid collection. Cerebellar tonsils are low lying positioned up to 9 mm 5 mm below the foramen magnum, consistent with cerebellar tonsillar ectopia. No frank Chiari malformation identified. Visualized upper cervical spine within normal limits. Pituitary gland normal.  No acute abnormality about the orbits. Mild mucosal thickening within the paranasal sinuses. No air-fluid levels to suggest active sinus infection.  No mastoid effusion. Inner ear structures grossly normal. Bone marrow signal intensity within normal limits. No scalp soft tissue abnormality. MRA HEAD FINDINGS: ANTERIOR CIRCULATION: Visualized distal cervical segments of the internal carotid arteries are widely patent with antegrade flow. Petrous, cavernous, and supraclinoid segments widely patent. A1 segments, anterior communicating artery, and anterior cerebral arteries are well opacified. M1 segments widely patent without stenosis or occlusion. MCA bifurcations normal. Distal MCA branches well opacified and symmetric. POSTERIOR CIRCULATION: Right  vertebral artery dominant and widely patent to the vertebrobasilar junction. Diminutive left vertebral artery terminates in PICA. Basilar artery widely patent. Superior cerebral arteries patent bilaterally. Prominent right posterior communicating artery supplies the right posterior cerebral arteries. Left PCA arises from the basilar artery. PCAs well opacified bilaterally. No aneurysm or vascular malformation. MRV HEAD FINDINGS: Dedicated MRV images demonstrate no filling defect to suggest venous sinus thrombosis. Specifically, the superior sagittal, transverse, and sigmoid sinuses are patent. Straight sinus, vein of Galen, and internal cerebral veins are patent. No definite dural sinus stenosis. IMPRESSION: MRI HEAD IMPRESSION: 1. Cerebellar tonsillar ectopia with the cerebellar tonsils positioned up to 5 mm below the foramen magnum. No frank Chiari 1 malformation. 2. Otherwise normal brain MRI. MRA HEAD IMPRESSION: Normal intracranial MRA. MRV HEAD IMPRESSION: Normal intracranial MRV. Electronically Signed   By: Jeannine Boga M.D.   On: 05/30/2015 01:18   Mr Hilary Hertz  05/30/2015  CLINICAL DATA:  Initial evaluation for persistent headache, neck stiffness, nausea, vomiting. EXAM: MRI HEAD WITHOUT CONTRAST MRA HEAD WITHOUT CONTRAST MRV HEAD WITHOUT CONTRAST TECHNIQUE: Multiplanar, multiecho pulse sequences of the brain and surrounding structures were obtained without intravenous contrast. Angiographic images of the intracranial arterial and venous structures were obtained using MRV technique without intravenous contrast. COMPARISON:  PRIOR CT from FINDINGS: MRI BRAIN FINDINGS: Cerebral volume normal for patient age. No focal parenchymal signal abnormality. No abnormal foci of restricted diffusion to suggest acute intracranial infarct. Gray-white matter differentiation well maintained. Major intracranial vascular flow voids preserved. No areas of chronic infarction. No acute or chronic intracranial  hemorrhage. No mass lesion, midline shift, or mass effect. No hydrocephalus. No extra-axial fluid collection. Cerebellar tonsils are low lying positioned up to 9 mm 5 mm below the foramen magnum, consistent with cerebellar tonsillar ectopia. No frank Chiari malformation identified. Visualized upper cervical spine within normal limits. Pituitary gland normal.  No acute abnormality about the orbits. Mild mucosal thickening within the paranasal sinuses. No air-fluid levels to suggest active sinus infection. No mastoid effusion. Inner ear structures grossly normal. Bone marrow signal intensity within normal limits. No scalp soft tissue abnormality. MRA HEAD FINDINGS: ANTERIOR CIRCULATION: Visualized distal cervical segments of the internal carotid arteries are widely patent with antegrade flow. Petrous, cavernous, and supraclinoid segments widely patent. A1 segments, anterior communicating artery, and anterior cerebral arteries are well opacified. M1 segments widely patent without stenosis or occlusion. MCA bifurcations normal. Distal MCA branches well opacified and symmetric. POSTERIOR CIRCULATION: Right vertebral artery dominant and widely patent to the vertebrobasilar junction. Diminutive left vertebral artery terminates in PICA. Basilar artery widely patent. Superior cerebral arteries patent bilaterally. Prominent right posterior communicating artery supplies the right posterior cerebral arteries. Left PCA arises from the basilar artery. PCAs well opacified bilaterally. No aneurysm or vascular malformation. MRV HEAD FINDINGS: Dedicated MRV images demonstrate no filling defect to suggest venous sinus thrombosis. Specifically, the superior sagittal, transverse, and sigmoid sinuses are patent. Straight sinus, vein of Galen, and internal cerebral veins are patent. No definite dural sinus stenosis. IMPRESSION: MRI  HEAD IMPRESSION: 1. Cerebellar tonsillar ectopia with the cerebellar tonsils positioned up to 5 mm below the  foramen magnum. No frank Chiari 1 malformation. 2. Otherwise normal brain MRI. MRA HEAD IMPRESSION: Normal intracranial MRA. MRV HEAD IMPRESSION: Normal intracranial MRV. Electronically Signed   By: Jeannine Boga M.D.   On: 05/30/2015 01:18   Ct Abdomen Pelvis W Contrast  05/31/2015  CLINICAL DATA:  Leukocytosis.  Nausea and vomiting. EXAM: CT CHEST, ABDOMEN, AND PELVIS WITH CONTRAST TECHNIQUE: Multidetector CT imaging of the chest, abdomen and pelvis was performed following the standard protocol during bolus administration of intravenous contrast. CONTRAST:  122mL ISOVUE-300 IOPAMIDOL (ISOVUE-300) INJECTION 61% COMPARISON:  04/15/2014 FINDINGS: CT CHEST The lungs are clear. Airways are normal. No hilar or mediastinal adenopathy. Axillary regions are unremarkable. No effusions. Small hiatal hernia. Esophagus is unremarkable. CT ABDOMEN AND PELVIS There are normal appearances of the liver, gallbladder, bile ducts, pancreas, spleen, adrenals and kidneys. Ureters and urinary bladder are unremarkable. There are normal appearances of the stomach, small bowel and colon. The appendix is normal. The abdominal aorta is normal in caliber. There is no atherosclerotic calcification. There is no adenopathy in the abdomen or pelvis. Both ovaries are normal. There appears to be a supracervical hysterectomy, with unremarkable appearances of the cervical remnant. No acute inflammatory changes are evident in the abdomen or pelvis. There is no ascites. There is no extraluminal air. There is no significant musculoskeletal abnormality. IMPRESSION: No acute findings are evident in the chest, abdomen or pelvis. Small hiatal hernia. Electronically Signed   By: Andreas Newport M.D.   On: 05/31/2015 00:53      Assessment/Plan:  INTERVAL HISTORY:  CT abdomen and pelvis completely benign  WBC normal  Enterovirus/rhinovirus on resp panel   Principal Problem:   Headache Active Problems:   Leukocytosis   Sepsis  (HCC)   Nausea, vomiting and diarrhea   Rash and nonspecific skin eruption    Swetha Magnone is a 30 y.o. female with  tick bite with a migrating rash that had been concerning for STARI vs Lyme (though no fever to indiate the latter) sp doxy and decadron then admission to [facility] with headache but benign LP, admitted  with N, V, D, HA and high wbc. I took off the doxycycline and got CT abdomen./pelvis which were completely benign. Her WBC is now normal  #1 leukocytosis of unknown cause: ? Steroids played a role, enterovirus. It is resolved now  CBC Latest Ref Rng 05/31/2015 05/30/2015 05/29/2015  WBC 4.0 - 10.5 K/uL 7.5 14.3(H) 22.0(H)  Hemoglobin 12.0 - 15.0 g/dL 14.1 14.3 15.9(H)  Hematocrit 36.0 - 46.0 % 42.8 42.7 47.3(H)  Platelets 150 - 400 K/uL 189 197 218    #2 Lyme vs STARI: at most had the latter. Did not have Lyme. No role for further doxy  #3 N, V, HA: could have been enterovirus: resolved  I will sign off  Patient is fine to DC to home. She does not need ID follouwup   LOS: 1 day   Alcide Evener 05/31/2015, 3:27 PM

## 2015-06-01 LAB — ANTINUCLEAR ANTIBODIES, IFA: ANA Ab, IFA: POSITIVE — AB

## 2015-06-01 LAB — FANA STAINING PATTERNS: Speckled Pattern: 1:80 {titer}

## 2015-06-01 LAB — CULTURE, GROUP A STREP (THRC)

## 2015-06-04 LAB — CULTURE, BLOOD (ROUTINE X 2)
Culture: NO GROWTH
Culture: NO GROWTH

## 2016-05-20 ENCOUNTER — Emergency Department (HOSPITAL_COMMUNITY)
Admission: EM | Admit: 2016-05-20 | Discharge: 2016-05-20 | Disposition: A | Discharge status: Home / Self Care | Payer: BLUE CROSS/BLUE SHIELD | Attending: Emergency Medicine | Admitting: Emergency Medicine

## 2016-05-20 ENCOUNTER — Encounter (HOSPITAL_COMMUNITY): Payer: Self-pay | Admitting: *Deleted

## 2016-05-20 DIAGNOSIS — N390 Urinary tract infection, site not specified: Secondary | ICD-10-CM | POA: Insufficient documentation

## 2016-05-20 DIAGNOSIS — R350 Frequency of micturition: Secondary | ICD-10-CM | POA: Diagnosis present

## 2016-05-20 LAB — URINALYSIS, ROUTINE W REFLEX MICROSCOPIC
BILIRUBIN URINE: NEGATIVE
GLUCOSE, UA: NEGATIVE mg/dL
Ketones, ur: NEGATIVE mg/dL
Nitrite: NEGATIVE
Protein, ur: 100 mg/dL — AB
SPECIFIC GRAVITY, URINE: 1.024 (ref 1.005–1.030)
pH: 5 (ref 5.0–8.0)

## 2016-05-20 LAB — PREGNANCY, URINE: Preg Test, Ur: NEGATIVE

## 2016-05-20 MED ORDER — CEPHALEXIN 500 MG PO CAPS: 500.0000 mg | ORAL_CAPSULE | Freq: Four times a day (QID) | ORAL | 0 refills | Status: DC

## 2016-05-20 MED ORDER — PHENAZOPYRIDINE HCL 100 MG PO TABS
200.0000 mg | ORAL_TABLET | Freq: Once | ORAL | Status: AC
  Administered 2016-05-20: 200 mg via ORAL
  Filled 2016-05-20: qty 2

## 2016-05-20 MED ORDER — PHENAZOPYRIDINE HCL 200 MG PO TABS: 200.0000 mg | ORAL_TABLET | Freq: Three times a day (TID) | ORAL | 0 refills | Status: DC

## 2016-05-20 MED ORDER — CEPHALEXIN 500 MG PO CAPS
500.0000 mg | ORAL_CAPSULE | Freq: Once | ORAL | Status: AC
  Administered 2016-05-20: 500 mg via ORAL
  Filled 2016-05-20: qty 1

## 2016-05-20 NOTE — Discharge Instructions (Signed)
Drink plenty of water.  Take the antibiotic as directed until its finished.  Return here for any worsening symptoms such as fever, vomiting or increasing pain to your back

## 2016-05-20 NOTE — ED Triage Notes (Signed)
UTI sx since 88  Dr Collene Mares PCP-

## 2016-05-20 NOTE — ED Notes (Signed)
Pt alert & oriented x4, stable gait. Patient given discharge instructions, paperwork & prescription(s). Patient  instructed to stop at the registration desk to finish any additional paperwork. Patient verbalized understanding. Pt left department w/ no further questions. 

## 2016-05-20 NOTE — ED Notes (Signed)
Pt states pain & increased in frequency of urination that started around 1700.

## 2016-05-20 NOTE — ED Triage Notes (Signed)
Pt reports dysuria, oliguria, urinary frequency and urgency since that started around 5:30 pm today. Pt reports sharp pains/spasms in her bladder. Reports having the same symptoms with her last UTI and denies any previous kidneys stones.

## 2016-05-22 LAB — URINE CULTURE: Culture: NO GROWTH

## 2016-05-22 NOTE — ED Provider Notes (Signed)
[facility] DEPT Provider Note   CSN: [id number] Arrival date & time: 05/20/16  2005     History   Chief Complaint Chief Complaint  Patient presents with  . Urinary Frequency    HPI [name] is a 31 y.o. female.  HPI   [name] is a 31 y.o. female who presents to the Emergency Department complaining of urinary urgency and frequency that began a few hours prior to arrival.  She reports urine is dark and cloudy.  She describes a pressure sensation to her bladder that is worse post void.  She denies fever, chills, vomiting, vaginal bleeding or discharge and back pain.   Past Medical History:  Diagnosis Date  . Breast nodule 03/25/2014  . BV (bacterial vaginosis) 03/18/2014  . Hypoglycemia   . Vaginal discharge 03/18/2014  . Vaginal itching 03/18/2014    Patient Active Problem List   Diagnosis Date Noted  . Rash and nonspecific skin eruption   . Headache 05/29/2015  . Sepsis ([place]) 05/29/2015  . Nausea, vomiting and diarrhea 05/29/2015  . Pressure in head   . Vomiting and diarrhea   . Erythrocytosis 05/28/2015  . Back stiffness 05/28/2015  . Neck stiffness 05/28/2015  . Vision changes 05/28/2015  . Leukocytosis 05/28/2015  . Meningitis like reaction 05/27/2015  . Breast nodule 03/25/2014  . Vaginal discharge 03/18/2014  . Vaginal itching 03/18/2014  . BV (bacterial vaginosis) 03/18/2014  . Pelvic peritoneal adhesions, female 02/19/2013  . Status post hysterectomy 02/19/2013    Past Surgical History:  Procedure Laterality Date  . ABDOMINAL HYSTERECTOMY    . CESAREAN SECTION  2009, 2012   x2  . LYSIS OF ADHESION N/A 02/19/2013   Procedure: LYSIS OF ADHESION;  Surgeon: [name], MD;  Location: [facility];  Service: Gynecology;  Laterality: N/A;  . MASS EXCISION N/A 07/23/2012   Procedure: EXCISION OF RIGHT LOWER ABDOMINAL MASS, ENDOMETRIAL IMPLANT;  Surgeon: Scherry Ran, MD;  Location: [facility];  Service: General;  Laterality: N/A;  . SCAR REVISION  N/A 02/19/2013   Procedure: EXCISION OF SCAR ENDOMETRIOSIS;  Surgeon: [name], MD;  Location: [facility];  Service: Gynecology;  Laterality: N/A;  . SUPRACERVICAL ABDOMINAL HYSTERECTOMY N/A 02/19/2013   Procedure: HYSTERECTOMY SUPRACERVICAL ABDOMINAL;  Surgeon: [name], MD;  Location: [facility];  Service: Gynecology;  Laterality: N/A;  . TONSILLECTOMY    . TUBAL LIGATION      OB History    Gravida Para Term Preterm AB Living   2 2           SAB TAB Ectopic Multiple Live Births                   Home Medications    Prior to Admission medications   Medication Sig Start Date End Date Taking? Authorizing Provider  ibuprofen (ADVIL,MOTRIN) 200 MG tablet Take 200-400 mg by mouth every 6 (six) hours as needed for mild pain.   Yes Historical Provider, MD  cephALEXin (KEFLEX) 500 MG capsule Take 1 capsule (500 mg total) by mouth 4 (four) times daily. For 7 days 05/20/16   Lazaro Isenhower, PA-C  phenazopyridine (PYRIDIUM) 200 MG tablet Take 1 tablet (200 mg total) by mouth 3 (three) times daily. 05/20/16   Ralston Venus, PA-C    Family History Family History  Problem Relation Age of Onset  . Hypertension Father   . Diabetes Other   . Heart disease Other   . Colon cancer Other   . Heart  disease Other     Social History Social History  Substance Use Topics  . Smoking status: Never Smoker  . Smokeless tobacco: Never Used  . Alcohol use No     Allergies   Codeine   Review of Systems Review of Systems  Constitutional: Negative for activity change, appetite change, chills and fever.  Respiratory: Negative for chest tightness and shortness of breath.   Gastrointestinal: Negative for abdominal pain, nausea and vomiting.  Genitourinary: Positive for dysuria, frequency and urgency. Negative for decreased urine volume, difficulty urinating, flank pain, hematuria, vaginal bleeding and vaginal discharge.  Musculoskeletal: Negative for back pain.  Skin: Negative for rash.    Neurological: Negative for dizziness, weakness and numbness.  Hematological: Negative for adenopathy.  Psychiatric/Behavioral: Negative for confusion.  All other systems reviewed and are negative.    Physical Exam Updated Vital Signs BP 123/76 (BP Location: Left Arm)   Pulse 97   Temp 98.7 F (37.1 C) (Oral)   Resp 15   Ht 5\' 1"  (1.549 m)   Wt 95.3 kg   LMP 01/08/2013   SpO2 100%   BMI 39.68 kg/m   Physical Exam  Constitutional: She is oriented to person, place, and time. She appears well-developed and well-nourished. No distress.  HENT:  Head: Normocephalic and atraumatic.  Cardiovascular: Normal rate, regular rhythm and normal heart sounds.   No murmur heard. Pulmonary/Chest: Effort normal and breath sounds normal. No respiratory distress. She has no wheezes. She has no rales.  Abdominal: Soft. Normal appearance. She exhibits no distension and no mass. There is no hepatosplenomegaly. There is tenderness in the suprapubic area. There is no rigidity, no rebound, no guarding, no CVA tenderness and no tenderness at McBurney's point.  Mild ttp of the suprapubic region.  Remaining abdomen is soft, non-tender without guarding or rebound tenderness. No CVA tenderness  Musculoskeletal: Normal range of motion. She exhibits no edema.  Neurological: She is alert and oriented to person, place, and time. Coordination normal.  Skin: Skin is warm and dry. No rash noted.  Nursing note and vitals reviewed.    ED Treatments / Results  Labs (all labs ordered are listed, but only abnormal results are displayed) Labs Reviewed  URINALYSIS, ROUTINE W REFLEX MICROSCOPIC - Abnormal; Notable for the following:       Result Value   APPearance CLOUDY (*)    Hgb urine dipstick LARGE (*)    Protein, ur 100 (*)    Leukocytes, UA LARGE (*)    Bacteria, UA RARE (*)    Squamous Epithelial / LPF 0-5 (*)    Non Squamous Epithelial 0-5 (*)    All other components within normal limits  URINE CULTURE   PREGNANCY, URINE    EKG  EKG Interpretation None       Radiology No results found.  Procedures Procedures (including critical care time)  Medications Ordered in ED Medications  cephALEXin (KEFLEX) capsule 500 mg (500 mg Oral Given 05/20/16 2156)  phenazopyridine (PYRIDIUM) tablet 200 mg (200 mg Oral Given 05/20/16 2156)     Initial Impression / Assessment and Plan / ED Course  I have reviewed the triage vital signs and the nursing notes.  Pertinent labs & imaging results that were available during my care of the patient were reviewed by me and considered in my medical decision making (see chart for details).     Pt well appearing.  Vitals stable .  No concerning sx's for pyelonephritis or kidney stone.  rx for keflex  and pyridium, urine culture pending.  Return precautions discussed.  Final Clinical Impressions(s) / ED Diagnoses   Final diagnoses:  Urinary tract infection in female    New Prescriptions Discharge Medication List as of 05/20/2016  9:59 PM    START taking these medications   Details  cephALEXin (KEFLEX) 500 MG capsule Take 1 capsule (500 mg total) by mouth 4 (four) times daily. For 7 days, Starting Fri 05/20/2016, Print    phenazopyridine (PYRIDIUM) 200 MG tablet Take 1 tablet (200 mg total) by mouth 3 (three) times daily., Starting Fri 05/20/2016, Print         Riann Oman [place], PA-C 05/22/16 1356    Milton Ferguson, MD 05/23/16 1300

## 2016-06-07 ENCOUNTER — Encounter (HOSPITAL_COMMUNITY): Payer: Self-pay | Admitting: *Deleted

## 2016-06-07 ENCOUNTER — Emergency Department (HOSPITAL_COMMUNITY)
Admission: EM | Admit: 2016-06-07 | Discharge: 2016-06-07 | Disposition: A | Discharge status: Home / Self Care | Payer: BLUE CROSS/BLUE SHIELD | Attending: Emergency Medicine | Admitting: Emergency Medicine

## 2016-06-07 DIAGNOSIS — R112 Nausea with vomiting, unspecified: Secondary | ICD-10-CM

## 2016-06-07 DIAGNOSIS — R52 Pain, unspecified: Secondary | ICD-10-CM | POA: Diagnosis not present

## 2016-06-07 DIAGNOSIS — R197 Diarrhea, unspecified: Secondary | ICD-10-CM | POA: Diagnosis not present

## 2016-06-07 LAB — CBC WITH DIFFERENTIAL/PLATELET
BASOS ABS: 0 10*3/uL (ref 0.0–0.1)
BASOS PCT: 0 %
Eosinophils Absolute: 0 10*3/uL (ref 0.0–0.7)
Eosinophils Relative: 0 %
HEMATOCRIT: 42.4 % (ref 36.0–46.0)
HEMOGLOBIN: 14.8 g/dL (ref 12.0–15.0)
Lymphocytes Relative: 5 %
Lymphs Abs: 0.6 10*3/uL — ABNORMAL LOW (ref 0.7–4.0)
MCH: 29.2 pg (ref 26.0–34.0)
MCHC: 34.9 g/dL (ref 30.0–36.0)
MCV: 83.6 fL (ref 78.0–100.0)
MONOS PCT: 4 %
Monocytes Absolute: 0.5 10*3/uL (ref 0.1–1.0)
NEUTROS ABS: 12.8 10*3/uL — AB (ref 1.7–7.7)
NEUTROS PCT: 91 %
Platelets: 223 10*3/uL (ref 150–400)
RBC: 5.07 MIL/uL (ref 3.87–5.11)
RDW: 12.9 % (ref 11.5–15.5)
WBC: 14 10*3/uL — ABNORMAL HIGH (ref 4.0–10.5)

## 2016-06-07 LAB — BASIC METABOLIC PANEL
ANION GAP: 9 (ref 5–15)
BUN: 10 mg/dL (ref 6–20)
CALCIUM: 9.1 mg/dL (ref 8.9–10.3)
CO2: 24 mmol/L (ref 22–32)
CREATININE: 1.02 mg/dL — AB (ref 0.44–1.00)
Chloride: 108 mmol/L (ref 101–111)
Glucose, Bld: 114 mg/dL — ABNORMAL HIGH (ref 65–99)
Potassium: 3.3 mmol/L — ABNORMAL LOW (ref 3.5–5.1)
SODIUM: 141 mmol/L (ref 135–145)

## 2016-06-07 LAB — URINALYSIS, ROUTINE W REFLEX MICROSCOPIC
BILIRUBIN URINE: NEGATIVE
Glucose, UA: NEGATIVE mg/dL
Hgb urine dipstick: NEGATIVE
KETONES UR: 5 mg/dL — AB
Nitrite: NEGATIVE
PROTEIN: NEGATIVE mg/dL
Specific Gravity, Urine: 1.024 (ref 1.005–1.030)
pH: 5 (ref 5.0–8.0)

## 2016-06-07 LAB — PREGNANCY, URINE: PREG TEST UR: NEGATIVE

## 2016-06-07 MED ORDER — METOCLOPRAMIDE HCL 5 MG/ML IJ SOLN
10.0000 mg | Freq: Once | INTRAMUSCULAR | Status: AC
  Administered 2016-06-07: 10 mg via INTRAVENOUS
  Filled 2016-06-07: qty 2

## 2016-06-07 MED ORDER — ONDANSETRON HCL 4 MG/2ML IJ SOLN
4.0000 mg | Freq: Once | INTRAMUSCULAR | Status: AC
  Administered 2016-06-07: 4 mg via INTRAVENOUS
  Filled 2016-06-07: qty 2

## 2016-06-07 MED ORDER — LOPERAMIDE HCL 2 MG PO CAPS
4.0000 mg | ORAL_CAPSULE | Freq: Once | ORAL | Status: AC
  Administered 2016-06-07: 4 mg via ORAL
  Filled 2016-06-07: qty 2

## 2016-06-07 MED ORDER — METOCLOPRAMIDE HCL 10 MG PO TABS: 10.0000 mg | ORAL_TABLET | Freq: Four times a day (QID) | ORAL | 0 refills | Status: DC | PRN

## 2016-06-07 MED ORDER — SODIUM CHLORIDE 0.9 % IV BOLUS (SEPSIS)
1000.0000 mL | Freq: Once | INTRAVENOUS | Status: AC
  Administered 2016-06-07: 1000 mL via INTRAVENOUS

## 2016-06-07 MED ORDER — DIPHENHYDRAMINE HCL 50 MG/ML IJ SOLN
25.0000 mg | Freq: Once | INTRAMUSCULAR | Status: AC
  Administered 2016-06-07: 25 mg via INTRAVENOUS
  Filled 2016-06-07: qty 1

## 2016-06-07 NOTE — ED Triage Notes (Signed)
Pt c/o generalized body aches and states she has diarrhea that is liquid with vomiting; pt states she has been vomiting and 2 weeks ago she was dx with a UTI and finished her antibiotics a few days ago; pt removed a tick from her this past weekend

## 2016-06-07 NOTE — ED Provider Notes (Signed)
[facility] DEPT Provider Note   CSN: [id number] Arrival date & time: 06/07/16  0248     History   Chief Complaint Chief Complaint  Patient presents with  . Generalized Body Aches    HPI [name] is a 31 y.o. female.  The history is provided by the patient.  She comes in with a 2 day history of nausea, vomiting, diarrhea. This is associated with chills, sweats, generalized body aches. She denies fever. There has been mild abdominal cramping. She tried Zofran which after sleep but has not improved the nausea. She also took a dose of Xanax without any benefit. She denies any sick contacts.  Past Medical History:  Diagnosis Date  . Breast nodule 03/25/2014  . BV (bacterial vaginosis) 03/18/2014  . Hypoglycemia   . Vaginal discharge 03/18/2014  . Vaginal itching 03/18/2014    Patient Active Problem List   Diagnosis Date Noted  . Rash and nonspecific skin eruption   . Headache 05/29/2015  . Sepsis ([place]) 05/29/2015  . Nausea, vomiting and diarrhea 05/29/2015  . Pressure in head   . Vomiting and diarrhea   . Erythrocytosis 05/28/2015  . Back stiffness 05/28/2015  . Neck stiffness 05/28/2015  . Vision changes 05/28/2015  . Leukocytosis 05/28/2015  . Meningitis like reaction 05/27/2015  . Breast nodule 03/25/2014  . Vaginal discharge 03/18/2014  . Vaginal itching 03/18/2014  . BV (bacterial vaginosis) 03/18/2014  . Pelvic peritoneal adhesions, female 02/19/2013  . Status post hysterectomy 02/19/2013    Past Surgical History:  Procedure Laterality Date  . ABDOMINAL HYSTERECTOMY    . CESAREAN SECTION  2009, 2012   x2  . LYSIS OF ADHESION N/A 02/19/2013   Procedure: LYSIS OF ADHESION;  Surgeon: [name], MD;  Location: [facility];  Service: Gynecology;  Laterality: N/A;  . MASS EXCISION N/A 07/23/2012   Procedure: EXCISION OF RIGHT LOWER ABDOMINAL MASS, ENDOMETRIAL IMPLANT;  Surgeon: Scherry Ran, MD;  Location: [facility];  Service: General;  Laterality: N/A;    . SCAR REVISION N/A 02/19/2013   Procedure: EXCISION OF SCAR ENDOMETRIOSIS;  Surgeon: [name], MD;  Location: [facility];  Service: Gynecology;  Laterality: N/A;  . SUPRACERVICAL ABDOMINAL HYSTERECTOMY N/A 02/19/2013   Procedure: HYSTERECTOMY SUPRACERVICAL ABDOMINAL;  Surgeon: [name], MD;  Location: [facility];  Service: Gynecology;  Laterality: N/A;  . TONSILLECTOMY    . TUBAL LIGATION      OB History    Gravida Para Term Preterm AB Living   2 2           SAB TAB Ectopic Multiple Live Births                   Home Medications    Prior to Admission medications   Medication Sig Start Date End Date Taking? Authorizing Provider  ibuprofen (ADVIL,MOTRIN) 200 MG tablet Take 200-400 mg by mouth every 6 (six) hours as needed for mild pain.    [provider]    Family History Family History  Problem Relation Age of Onset  . Hypertension Father   . Diabetes Other   . Heart disease Other   . Colon cancer Other   . Heart disease Other     Social History Social History  Substance Use Topics  . Smoking status: Never Smoker  . Smokeless tobacco: Never Used  . Alcohol use No     Allergies   Codeine   Review of Systems Review of Systems  All other  systems reviewed and are negative.    Physical Exam Updated Vital Signs BP 130/90 (BP Location: Left Arm)   Pulse (!) 126   Temp 98.1 F (36.7 C) (Oral)   Resp 20   Ht 5\' 1"  (1.549 m)   Wt 210 lb (95.3 kg)   LMP 01/08/2013   SpO2 100%   BMI 39.68 kg/m   Physical Exam  Nursing note and vitals reviewed.  31 year old female, resting comfortably and in no acute distress. Vital signs are significant for tachycardia. Oxygen saturation is 100%, which is normal. Head is normocephalic and atraumatic. PERRLA, EOMI. Oropharynx is clear. Neck is nontender and supple without adenopathy or JVD. Back is nontender and there is no CVA tenderness. Lungs are clear without rales, wheezes, or rhonchi. Chest is  nontender. Heart his tachycardic without murmur. Abdomen is soft, flat, nontender without masses or hepatosplenomegaly and peristalsis is normoactive. Extremities have no cyanosis or edema, full range of motion is present. Skin is warm and dry without rash. Neurologic: Mental status is normal, cranial nerves are intact, there are no motor or sensory deficits.  ED Treatments / Results  Labs (all labs ordered are listed, but only abnormal results are displayed) Labs Reviewed  URINALYSIS, ROUTINE W REFLEX MICROSCOPIC - Abnormal; Notable for the following:       Result Value   APPearance CLOUDY (*)    Ketones, ur 5 (*)    Leukocytes, UA TRACE (*)    Bacteria, UA MANY (*)    Squamous Epithelial / LPF TOO NUMEROUS TO COUNT (*)    All other components within normal limits  BASIC METABOLIC PANEL - Abnormal; Notable for the following:    Potassium 3.3 (*)    Glucose, Bld 114 (*)    Creatinine, Ser 1.02 (*)    All other components within normal limits  CBC WITH DIFFERENTIAL/PLATELET - Abnormal; Notable for the following:    WBC 14.0 (*)    Neutro Abs 12.8 (*)    Lymphs Abs 0.6 (*)    All other components within normal limits  PREGNANCY, URINE   Procedures Procedures (including critical care time)  Medications Ordered in ED Medications  ondansetron (ZOFRAN) injection 4 mg (4 mg Intravenous Given 06/07/16 0337)  sodium chloride 0.9 % bolus 1,000 mL (0 mLs Intravenous Stopped 06/07/16 0502)  loperamide (IMODIUM) capsule 4 mg (4 mg Oral Given 06/07/16 0339)  metoCLOPramide (REGLAN) injection 10 mg (10 mg Intravenous Given 06/07/16 0518)  diphenhydrAMINE (BENADRYL) injection 25 mg (25 mg Intravenous Given 06/07/16 0516)     Initial Impression / Assessment and Plan / ED Course  I have reviewed the triage vital signs and the nursing notes.  Pertinent lab results that were available during my care of the patient were reviewed by me and considered in my medical decision making (see chart for  details).  Nausea, vomiting, diarrhea with associated chills and myalgias. Overall picture seems most consistent with viral gastroenteritis. She will begin IV fluids, ondansetron, and oral loperamide. Screening labs are obtained. Old records are reviewed showing ED visit 2 weeks ago for UTI treated with cephalexin, but culture was negative.  She initially had improvement with above noted treatment, but nausea recurred. She continued to have diarrhea in the ED. She is given a dose of metoclopramide with significant improvement in nausea. She is discharged with prescription for metoclopramide and advised to use over-the-counter loperamide as needed for diarrhea.  Final Clinical Impressions(s) / ED Diagnoses   Final diagnoses:  Nausea  vomiting and diarrhea    New Prescriptions New Prescriptions   METOCLOPRAMIDE (REGLAN) 10 MG TABLET    Take 1 tablet (10 mg total) by mouth every 6 (six) hours as needed.     Delora Fuel, MD 50/27/74 [phone]

## 2016-06-07 NOTE — Discharge Instructions (Signed)
Take loperamide (Imodium AD) as needed for diarrhea. You may take it up to four times a day, but be careful - if you take too much, you can end up constipated.

## 2016-06-21 ENCOUNTER — Telehealth: Payer: Self-pay | Admitting: *Deleted

## 2016-06-21 NOTE — Telephone Encounter (Signed)
Patient called stating she had one episode of bright red bleeding this am which is now just dark brown. She is not having any abdominal pain. Advised to continue to monitor bleeding since it was just one time and if becomes bright red again, to give [place] a call back. Verbalized understanding.

## 2016-10-11 ENCOUNTER — Encounter (HOSPITAL_COMMUNITY)
Admission: EM | Disposition: A | Discharge status: Home / Self Care | Payer: Self-pay | Source: Home / Self Care | Attending: Emergency Medicine

## 2016-10-11 ENCOUNTER — Emergency Department (HOSPITAL_COMMUNITY): Payer: BLUE CROSS/BLUE SHIELD

## 2016-10-11 ENCOUNTER — Emergency Department (HOSPITAL_COMMUNITY): Payer: BLUE CROSS/BLUE SHIELD | Admitting: Anesthesiology

## 2016-10-11 ENCOUNTER — Observation Stay (HOSPITAL_COMMUNITY)
Admission: EM | Admit: 2016-10-11 | Discharge: 2016-10-12 | Disposition: A | Discharge status: Home / Self Care | Payer: BLUE CROSS/BLUE SHIELD | Attending: General Surgery | Admitting: General Surgery

## 2016-10-11 ENCOUNTER — Encounter (HOSPITAL_COMMUNITY): Payer: Self-pay | Admitting: *Deleted

## 2016-10-11 DIAGNOSIS — N63 Unspecified lump in unspecified breast: Secondary | ICD-10-CM | POA: Diagnosis not present

## 2016-10-11 DIAGNOSIS — R1011 Right upper quadrant pain: Secondary | ICD-10-CM | POA: Diagnosis present

## 2016-10-11 DIAGNOSIS — Z90710 Acquired absence of both cervix and uterus: Secondary | ICD-10-CM | POA: Insufficient documentation

## 2016-10-11 DIAGNOSIS — K3580 Unspecified acute appendicitis: Principal | ICD-10-CM

## 2016-10-11 DIAGNOSIS — D751 Secondary polycythemia: Secondary | ICD-10-CM | POA: Insufficient documentation

## 2016-10-11 DIAGNOSIS — N736 Female pelvic peritoneal adhesions (postinfective): Secondary | ICD-10-CM | POA: Insufficient documentation

## 2016-10-11 HISTORY — PX: LAPAROSCOPIC APPENDECTOMY: SHX408

## 2016-10-11 LAB — URINALYSIS, ROUTINE W REFLEX MICROSCOPIC
Bilirubin Urine: NEGATIVE
GLUCOSE, UA: NEGATIVE mg/dL
Hgb urine dipstick: NEGATIVE
KETONES UR: NEGATIVE mg/dL
LEUKOCYTES UA: NEGATIVE
NITRITE: NEGATIVE
PROTEIN: NEGATIVE mg/dL
Specific Gravity, Urine: 1.012 (ref 1.005–1.030)
pH: 5 (ref 5.0–8.0)

## 2016-10-11 LAB — COMPREHENSIVE METABOLIC PANEL
ALT: 17 U/L (ref 14–54)
AST: 16 U/L (ref 15–41)
Albumin: 4 g/dL (ref 3.5–5.0)
Alkaline Phosphatase: 70 U/L (ref 38–126)
Anion gap: 8 (ref 5–15)
BILIRUBIN TOTAL: 0.5 mg/dL (ref 0.3–1.2)
BUN: 13 mg/dL (ref 6–20)
CHLORIDE: 102 mmol/L (ref 101–111)
CO2: 28 mmol/L (ref 22–32)
CREATININE: 0.91 mg/dL (ref 0.44–1.00)
Calcium: 9.2 mg/dL (ref 8.9–10.3)
Glucose, Bld: 135 mg/dL — ABNORMAL HIGH (ref 65–99)
POTASSIUM: 4.1 mmol/L (ref 3.5–5.1)
Sodium: 138 mmol/L (ref 135–145)
TOTAL PROTEIN: 6.9 g/dL (ref 6.5–8.1)

## 2016-10-11 LAB — CBC
HEMATOCRIT: 41.8 % (ref 36.0–46.0)
Hemoglobin: 14.4 g/dL (ref 12.0–15.0)
MCH: 29 pg (ref 26.0–34.0)
MCHC: 34.4 g/dL (ref 30.0–36.0)
MCV: 84.1 fL (ref 78.0–100.0)
PLATELETS: 245 10*3/uL (ref 150–400)
RBC: 4.97 MIL/uL (ref 3.87–5.11)
RDW: 13 % (ref 11.5–15.5)
WBC: 13.2 10*3/uL — AB (ref 4.0–10.5)

## 2016-10-11 LAB — LIPASE, BLOOD: LIPASE: 28 U/L (ref 11–51)

## 2016-10-11 SURGERY — APPENDECTOMY, LAPAROSCOPIC
Anesthesia: General | Site: Abdomen

## 2016-10-11 MED ORDER — SUCCINYLCHOLINE CHLORIDE 20 MG/ML IJ SOLN
INTRAMUSCULAR | Status: DC | PRN
  Administered 2016-10-11: 160 mg via INTRAVENOUS

## 2016-10-11 MED ORDER — FENTANYL CITRATE (PF) 250 MCG/5ML IJ SOLN
INTRAMUSCULAR | Status: AC
  Filled 2016-10-11: qty 5

## 2016-10-11 MED ORDER — CHLORHEXIDINE GLUCONATE CLOTH 2 % EX PADS: 6.0000 | MEDICATED_PAD | Freq: Once | CUTANEOUS | Status: DC

## 2016-10-11 MED ORDER — LIDOCAINE HCL (PF) 1 % IJ SOLN
INTRAMUSCULAR | Status: AC
  Filled 2016-10-11: qty 5

## 2016-10-11 MED ORDER — LIDOCAINE HCL (CARDIAC) 20 MG/ML IV SOLN
INTRAVENOUS | Status: DC | PRN
  Administered 2016-10-11: 40 mg via INTRAVENOUS

## 2016-10-11 MED ORDER — OXYCODONE HCL 5 MG PO TABS
5.0000 mg | ORAL_TABLET | ORAL | Status: DC | PRN
  Administered 2016-10-11: 5 mg via ORAL
  Administered 2016-10-12: 10 mg via ORAL
  Filled 2016-10-11: qty 1
  Filled 2016-10-11: qty 2

## 2016-10-11 MED ORDER — ONDANSETRON 4 MG PO TBDP: 4.0000 mg | ORAL_TABLET | Freq: Four times a day (QID) | ORAL | Status: DC | PRN

## 2016-10-11 MED ORDER — 0.9 % SODIUM CHLORIDE (POUR BTL) OPTIME
TOPICAL | Status: DC | PRN
  Administered 2016-10-11: 1000 mL

## 2016-10-11 MED ORDER — LACTATED RINGERS IV SOLN
Freq: Once | INTRAVENOUS | Status: AC
  Administered 2016-10-11: 14:00:00 via INTRAVENOUS

## 2016-10-11 MED ORDER — IOPAMIDOL (ISOVUE-300) INJECTION 61%
100.0000 mL | Freq: Once | INTRAVENOUS | Status: AC | PRN
  Administered 2016-10-11: 100 mL via INTRAVENOUS

## 2016-10-11 MED ORDER — MORPHINE SULFATE (PF) 2 MG/ML IV SOLN
2.0000 mg | INTRAVENOUS | Status: DC | PRN
  Administered 2016-10-11: 2 mg via INTRAVENOUS
  Filled 2016-10-11: qty 1

## 2016-10-11 MED ORDER — MORPHINE SULFATE (PF) 4 MG/ML IV SOLN: 4.0000 mg | INTRAVENOUS | Status: DC | PRN

## 2016-10-11 MED ORDER — MIDAZOLAM HCL 2 MG/2ML IJ SOLN
INTRAMUSCULAR | Status: AC
  Filled 2016-10-11: qty 2

## 2016-10-11 MED ORDER — ROCURONIUM BROMIDE 100 MG/10ML IV SOLN
INTRAVENOUS | Status: DC | PRN
  Administered 2016-10-11: 10 mg via INTRAVENOUS
  Administered 2016-10-11: 20 mg via INTRAVENOUS

## 2016-10-11 MED ORDER — POVIDONE-IODINE 10 % EX OINT
TOPICAL_OINTMENT | CUTANEOUS | Status: AC
  Filled 2016-10-11: qty 1

## 2016-10-11 MED ORDER — ONDANSETRON HCL 4 MG/2ML IJ SOLN
4.0000 mg | Freq: Once | INTRAMUSCULAR | Status: AC
  Administered 2016-10-11: 4 mg via INTRAVENOUS
  Filled 2016-10-11: qty 2

## 2016-10-11 MED ORDER — PROPOFOL 10 MG/ML IV BOLUS
INTRAVENOUS | Status: AC
  Filled 2016-10-11: qty 40

## 2016-10-11 MED ORDER — SODIUM CHLORIDE 0.9 % IV SOLN
INTRAVENOUS | Status: DC
  Administered 2016-10-11 – 2016-10-12 (×2): via INTRAVENOUS

## 2016-10-11 MED ORDER — ONDANSETRON HCL 4 MG/2ML IJ SOLN
INTRAMUSCULAR | Status: DC | PRN
  Administered 2016-10-11: 4 mg via INTRAVENOUS

## 2016-10-11 MED ORDER — DIPHENHYDRAMINE HCL 50 MG/ML IJ SOLN
25.0000 mg | Freq: Four times a day (QID) | INTRAMUSCULAR | Status: DC | PRN
  Administered 2016-10-11 – 2016-10-12 (×2): 25 mg via INTRAVENOUS
  Filled 2016-10-11 (×2): qty 1

## 2016-10-11 MED ORDER — METOCLOPRAMIDE HCL 5 MG/ML IJ SOLN
10.0000 mg | Freq: Once | INTRAMUSCULAR | Status: AC
  Administered 2016-10-11: 10 mg via INTRAVENOUS
  Filled 2016-10-11: qty 2

## 2016-10-11 MED ORDER — ONDANSETRON HCL 4 MG/2ML IJ SOLN
INTRAMUSCULAR | Status: AC
Start: 2016-10-11 — End: 2016-10-11
  Filled 2016-10-11: qty 2

## 2016-10-11 MED ORDER — MORPHINE SULFATE (PF) 2 MG/ML IV SOLN
INTRAVENOUS | Status: AC
  Administered 2016-10-11: 4 mg via INTRAVENOUS
  Filled 2016-10-11: qty 2

## 2016-10-11 MED ORDER — SUCCINYLCHOLINE CHLORIDE 20 MG/ML IJ SOLN
INTRAMUSCULAR | Status: AC
  Filled 2016-10-11: qty 2

## 2016-10-11 MED ORDER — MIDAZOLAM HCL 5 MG/5ML IJ SOLN
INTRAMUSCULAR | Status: DC | PRN
Start: 2016-10-11 — End: 2016-10-11
  Administered 2016-10-11: 2 mg via INTRAVENOUS

## 2016-10-11 MED ORDER — ONDANSETRON HCL 4 MG/2ML IJ SOLN
4.0000 mg | Freq: Four times a day (QID) | INTRAMUSCULAR | Status: DC | PRN
  Administered 2016-10-11: 4 mg via INTRAVENOUS
  Filled 2016-10-11: qty 2

## 2016-10-11 MED ORDER — MORPHINE SULFATE (PF) 2 MG/ML IV SOLN
2.0000 mg | Freq: Once | INTRAVENOUS | Status: AC
  Administered 2016-10-11: 2 mg via INTRAVENOUS
  Filled 2016-10-11: qty 1

## 2016-10-11 MED ORDER — ACETAMINOPHEN 500 MG PO TABS
1000.0000 mg | ORAL_TABLET | Freq: Four times a day (QID) | ORAL | Status: DC
  Administered 2016-10-11 – 2016-10-12 (×3): 1000 mg via ORAL
  Filled 2016-10-11 (×3): qty 2

## 2016-10-11 MED ORDER — BUPIVACAINE HCL (PF) 0.5 % IJ SOLN
INTRAMUSCULAR | Status: AC
  Filled 2016-10-11: qty 30

## 2016-10-11 MED ORDER — SODIUM CHLORIDE 0.9 % IJ SOLN
INTRAMUSCULAR | Status: AC
  Filled 2016-10-11: qty 10

## 2016-10-11 MED ORDER — METOCLOPRAMIDE HCL 10 MG PO TABS
10.0000 mg | ORAL_TABLET | Freq: Once | ORAL | Status: DC
  Filled 2016-10-11: qty 1

## 2016-10-11 MED ORDER — GLYCOPYRROLATE 0.2 MG/ML IJ SOLN
INTRAMUSCULAR | Status: DC | PRN
  Administered 2016-10-11: 0.2 mg via INTRAVENOUS

## 2016-10-11 MED ORDER — CEFOTETAN DISODIUM-DEXTROSE 2-2.08 GM-% IV SOLR
INTRAVENOUS | Status: AC
  Filled 2016-10-11: qty 50

## 2016-10-11 MED ORDER — ATROPINE SULFATE 0.4 MG/ML IJ SOLN
INTRAMUSCULAR | Status: AC
  Filled 2016-10-11: qty 1

## 2016-10-11 MED ORDER — FENTANYL CITRATE (PF) 100 MCG/2ML IJ SOLN
INTRAMUSCULAR | Status: AC
  Filled 2016-10-11: qty 2

## 2016-10-11 MED ORDER — ROCURONIUM BROMIDE 50 MG/5ML IV SOLN
INTRAVENOUS | Status: AC
Start: 2016-10-11 — End: 2016-10-11
  Filled 2016-10-11: qty 1

## 2016-10-11 MED ORDER — BUPIVACAINE HCL 0.5 % IJ SOLN
INTRAMUSCULAR | Status: DC | PRN
Start: 2016-10-11 — End: 2016-10-11
  Administered 2016-10-11: 10 mL

## 2016-10-11 MED ORDER — MENTHOL 3 MG MT LOZG: 1.0000 | LOZENGE | OROMUCOSAL | Status: DC | PRN

## 2016-10-11 MED ORDER — FENTANYL CITRATE (PF) 100 MCG/2ML IJ SOLN
INTRAMUSCULAR | Status: DC | PRN
  Administered 2016-10-11: 50 ug via INTRAVENOUS
  Administered 2016-10-11: 25 ug via INTRAVENOUS
  Administered 2016-10-11 (×3): 50 ug via INTRAVENOUS
  Administered 2016-10-11: 25 ug via INTRAVENOUS
  Administered 2016-10-11: 50 ug via INTRAVENOUS

## 2016-10-11 MED ORDER — DEXAMETHASONE SODIUM PHOSPHATE 4 MG/ML IJ SOLN
INTRAMUSCULAR | Status: DC | PRN
  Administered 2016-10-11: 4 mg via INTRAVENOUS

## 2016-10-11 MED ORDER — DEXAMETHASONE SODIUM PHOSPHATE 4 MG/ML IJ SOLN
INTRAMUSCULAR | Status: AC
  Filled 2016-10-11: qty 1

## 2016-10-11 MED ORDER — OXYCODONE HCL 5 MG PO TABS: 5.0000 mg | ORAL_TABLET | ORAL | 0 refills | Status: DC | PRN

## 2016-10-11 MED ORDER — GLYCOPYRROLATE 0.2 MG/ML IJ SOLN
INTRAMUSCULAR | Status: AC
  Filled 2016-10-11: qty 1

## 2016-10-11 MED ORDER — LACTATED RINGERS IV SOLN
INTRAVENOUS | Status: DC
  Administered 2016-10-11: 14:00:00 via INTRAVENOUS

## 2016-10-11 MED ORDER — PROPOFOL 10 MG/ML IV BOLUS
INTRAVENOUS | Status: DC | PRN
  Administered 2016-10-11: 30 mg via INTRAVENOUS
  Administered 2016-10-11: 20 mg via INTRAVENOUS
  Administered 2016-10-11: 170 mg via INTRAVENOUS

## 2016-10-11 MED ORDER — ONDANSETRON HCL 4 MG/2ML IJ SOLN
INTRAMUSCULAR | Status: AC
  Filled 2016-10-11: qty 2

## 2016-10-11 MED ORDER — PIPERACILLIN-TAZOBACTAM 3.375 G IVPB 30 MIN
3.3750 g | Freq: Once | INTRAVENOUS | Status: AC
  Administered 2016-10-11: 3.375 g via INTRAVENOUS
  Filled 2016-10-11: qty 50

## 2016-10-11 MED ORDER — CEFOTETAN DISODIUM-DEXTROSE 2-2.08 GM-% IV SOLR
2.0000 g | INTRAVENOUS | Status: AC
  Administered 2016-10-11: 2 g via INTRAVENOUS

## 2016-10-11 MED ORDER — SUGAMMADEX SODIUM 500 MG/5ML IV SOLN
INTRAVENOUS | Status: DC | PRN
  Administered 2016-10-11: 195 mg via INTRAVENOUS

## 2016-10-11 MED ORDER — EPHEDRINE SULFATE 50 MG/ML IJ SOLN
INTRAMUSCULAR | Status: AC
  Filled 2016-10-11: qty 1

## 2016-10-11 MED ORDER — SODIUM CHLORIDE 0.9 % IV BOLUS (SEPSIS)
1000.0000 mL | Freq: Once | INTRAVENOUS | Status: AC
  Administered 2016-10-11: 1000 mL via INTRAVENOUS

## 2016-10-11 SURGICAL SUPPLY — 45 items
ADH SKN CLS APL DERMABOND .7 (GAUZE/BANDAGES/DRESSINGS) ×1
BAG HAMPER (MISCELLANEOUS) ×2 IMPLANT
BAG RETRIEVAL 10 (BASKET) ×1
CLOTH BEACON ORANGE TIMEOUT ST (SAFETY) ×2 IMPLANT
COVER LIGHT HANDLE STERIS (MISCELLANEOUS) ×4 IMPLANT
CUTTER FLEX LINEAR 45M (STAPLE) ×2 IMPLANT
DECANTER SPIKE VIAL GLASS SM (MISCELLANEOUS) ×2 IMPLANT
DERMABOND ADVANCED (GAUZE/BANDAGES/DRESSINGS) ×1
DERMABOND ADVANCED .7 DNX12 (GAUZE/BANDAGES/DRESSINGS) IMPLANT
DEVICE TROCAR PUNCTURE CLOSURE (ENDOMECHANICALS) ×2 IMPLANT
ELECT REM PT RETURN 9FT ADLT (ELECTROSURGICAL) ×2
ELECTRODE REM PT RTRN 9FT ADLT (ELECTROSURGICAL) ×1 IMPLANT
FORMALIN 10 PREFIL 120ML (MISCELLANEOUS) ×2 IMPLANT
GLOVE BIO SURGEON STRL SZ 6.5 (GLOVE) ×3 IMPLANT
GLOVE BIOGEL PI IND STRL 6.5 (GLOVE) ×1 IMPLANT
GLOVE BIOGEL PI IND STRL 7.0 (GLOVE) IMPLANT
GLOVE BIOGEL PI INDICATOR 6.5 (GLOVE) ×2
GLOVE BIOGEL PI INDICATOR 7.0 (GLOVE) ×1
GOWN STRL REUS W/TWL LRG LVL3 (GOWN DISPOSABLE) ×5 IMPLANT
INST SET LAPROSCOPIC AP (KITS) ×2 IMPLANT
KIT ROOM TURNOVER APOR (KITS) ×2 IMPLANT
MANIFOLD NEPTUNE II (INSTRUMENTS) ×2 IMPLANT
NDL INSUFFLATION 14GA 120MM (NEEDLE) ×1 IMPLANT
NEEDLE INSUFFLATION 14GA 120MM (NEEDLE) ×2 IMPLANT
NS IRRIG 1000ML POUR BTL (IV SOLUTION) ×2 IMPLANT
PACK LAP CHOLE LZT030E (CUSTOM PROCEDURE TRAY) ×2 IMPLANT
PAD ARMBOARD 7.5X6 YLW CONV (MISCELLANEOUS) ×2 IMPLANT
PENCIL HANDSWITCHING (ELECTRODE) ×1 IMPLANT
RELOAD STAPLE 45 3.5 BLU ETS (ENDOMECHANICALS) IMPLANT
RELOAD STAPLE TA45 3.5 REG BLU (ENDOMECHANICALS) ×2 IMPLANT
SET BASIN LINEN APH (SET/KITS/TRAYS/PACK) ×2 IMPLANT
SET TUBE IRRIG SUCTION NO TIP (IRRIGATION / IRRIGATOR) IMPLANT
SHEARS HARMONIC ACE PLUS 36CM (ENDOMECHANICALS) ×2 IMPLANT
SLEEVE ENDOPATH XCEL 5M (ENDOMECHANICALS) IMPLANT
SUT MNCRL AB 4-0 PS2 18 (SUTURE) ×2 IMPLANT
SUT VIC AB 2-0 CT2 27 (SUTURE) ×2 IMPLANT
SYS BAG RETRIEVAL 10MM (BASKET) ×1
SYSTEM BAG RETRIEVAL 10MM (BASKET) ×1 IMPLANT
TRAY FOLEY CATH 16FR SILVER (SET/KITS/TRAYS/PACK) ×2 IMPLANT
TROCAR ENDO BLADELESS 11MM (ENDOMECHANICALS) ×2 IMPLANT
TROCAR ENDO BLADELESS 12MM (ENDOMECHANICALS) ×2 IMPLANT
TROCAR XCEL NON-BLD 5MMX100MML (ENDOMECHANICALS) ×2 IMPLANT
TUBING INSUFFLATION (TUBING) ×2 IMPLANT
WARMER LAPAROSCOPE (MISCELLANEOUS) ×2 IMPLANT
YANKAUER SUCT BULB TIP 10FT TU (MISCELLANEOUS) ×1 IMPLANT

## 2016-10-11 NOTE — Brief Op Note (Signed)
10/11/2016  4:55 PM  PATIENT:  [name]  31 y.o. female  PRE-OPERATIVE DIAGNOSIS:  Acute appendicitis  POST-OPERATIVE DIAGNOSIS:  acute appendicitis  PROCEDURE:  Procedure(s): APPENDECTOMY LAPAROSCOPIC (N/A)  SURGEON:  Surgeon(s) and Role:    * Jadavion Spoelstra, Lanell Matar, MD - Primary  PHYSICIAN ASSISTANT:  None  ANESTHESIA:   general  EBL:  Total I/O In: 700 [I.V.:700] Out: 210 [Urine:200; Blood:10]  BLOOD ADMINISTERED:none  DRAINS: none   LOCAL MEDICATIONS USED:  MARCAINE     SPECIMEN:  Source of Specimen:  appendix  DISPOSITION OF SPECIMEN:  PATHOLOGY  COUNTS:  YES   PLAN OF CARE: Admit for overnight observation  PATIENT DISPOSITION:  PACU - hemodynamically stable.

## 2016-10-11 NOTE — ED Provider Notes (Signed)
Signed out pending abdominal ultrasound. Color without abnormality. Patient is still tender to palpation especially in the right side of the abdomen. CT abdomen pelvis obtained. No evidence of acute appendicitis. Discussed with Dr. Constance Haw. Will see patient in the emergency department. Will start on antibiotics.   Julianne Rice, MD 10/11/16 1204

## 2016-10-11 NOTE — Anesthesia Procedure Notes (Signed)
Procedure Name: Intubation Date/Time: 10/11/2016 2:42 PM Performed by: Andree Elk, AMY A Pre-anesthesia Checklist: Patient identified, Patient being monitored, Timeout performed, Emergency Drugs available and Suction available Patient Re-evaluated:Patient Re-evaluated prior to induction Oxygen Delivery Method: Circle System Utilized Preoxygenation: Pre-oxygenation with 100% oxygen Induction Type: IV induction, Cricoid Pressure applied and Rapid sequence Laryngoscope Size: Miller and 3 Grade View: Grade I Tube type: Oral Tube size: 7.0 mm Number of attempts: 1 Airway Equipment and Method: Stylet Placement Confirmation: ETT inserted through vocal cords under direct vision,  positive ETCO2 and breath sounds checked- equal and bilateral Secured at: 21 cm Tube secured with: Tape Dental Injury: Teeth and Oropharynx as per pre-operative assessment

## 2016-10-11 NOTE — Transfer of Care (Signed)
Immediate Anesthesia Transfer of Care Note  Patient: [name]  Procedure(s) Performed: Procedure(s): APPENDECTOMY LAPAROSCOPIC (N/A)  Patient Location: PACU  Anesthesia Type:General  Level of Consciousness: oriented, drowsy and patient cooperative  Airway & Oxygen Therapy: Patient Spontanous Breathing and Patient connected to face mask oxygen  Post-op Assessment: Report given to RN and Post -op Vital signs reviewed and stable  Post vital signs: Reviewed and stable  Last Vitals:  Vitals:   10/11/16 1420 10/11/16 1630  BP: (!) 109/58 130/65  Pulse:  (!) 109  Resp: (!) 23 19  Temp:  36.6 C  SpO2: 100% 100%    Last Pain:  Vitals:   10/11/16 1353  TempSrc: Oral  PainSc:       Patients Stated Pain Goal: 3 (51/10/21 1173)  Complications: No apparent anesthesia complications

## 2016-10-11 NOTE — Anesthesia Postprocedure Evaluation (Signed)
Anesthesia Post Note  Patient: Angee Gupton  Procedure(s) Performed: Procedure(s) (LRB): APPENDECTOMY LAPAROSCOPIC (N/A)  Patient location during evaluation: PACU Anesthesia Type: General Level of consciousness: awake and alert, oriented and patient cooperative Pain management: pain level controlled (Rates pain as 9; receiving fentanyl) Vital Signs Assessment: post-procedure vital signs reviewed and stable Respiratory status: spontaneous breathing Cardiovascular status: stable Postop Assessment: no apparent nausea or vomiting Anesthetic complications: no     Last Vitals:  Vitals:   10/11/16 1700 10/11/16 1715  BP: 121/88 128/80  Pulse: (!) 103 (!) 101  Resp: 18 17  Temp:    SpO2: 96% 97%    Last Pain:  Vitals:   10/11/16 1715  TempSrc:   PainSc: 9                  ADAMS, AMY A

## 2016-10-11 NOTE — Anesthesia Preprocedure Evaluation (Signed)
Anesthesia Evaluation  Patient identified by MRN, date of birth, ID band Patient awake    Airway Mallampati: I  TM Distance: >3 FB Neck ROM: Full    Dental  (+) Teeth Intact   Pulmonary    breath sounds clear to auscultation       Cardiovascular  Rhythm:Regular Rate:Normal     Neuro/Psych    GI/Hepatic GERD  Controlled,Acute appendicitis   Endo/Other    Renal/GU      Musculoskeletal   Abdominal   Peds  Hematology   Anesthesia Other Findings   Reproductive/Obstetrics                             Anesthesia Physical Anesthesia Plan  ASA: I and emergent  Anesthesia Plan: General   Post-op Pain Management:    Induction: Intravenous, Rapid sequence and Cricoid pressure planned  PONV Risk Score and Plan: 3 and Ondansetron, Dexamethasone, Midazolam and Propofol infusion  Airway Management Planned: Oral ETT  Additional Equipment:   Intra-op Plan:   Post-operative Plan:   Informed Consent: I have reviewed the patients History and Physical, chart, labs and discussed the procedure including the risks, benefits and alternatives for the proposed anesthesia with the patient or authorized representative who has indicated his/her understanding and acceptance.   Dental advisory given  Plan Discussed with: Surgeon  Anesthesia Plan Comments:         Anesthesia Quick Evaluation

## 2016-10-11 NOTE — ED Triage Notes (Signed)
Pt c/o right side abdominal pain that radiates around to her back; pt describes the feeling as very uncomfortable and started last night after dinner; pt states she has not vomited but feels nauseous

## 2016-10-11 NOTE — H&P (Signed)
Subjective:    Patient is a 31 y.o. female presents with right sided abdominal pain. Onset of symptoms was abrupt starting early this AM and kept her from going to sleep. She felt that she needed to have a BM but this did not resolve the symptoms. She has had some nausea but no vomiting. She denies every having these issues before. She has had a history of endometriosis and partial hysterectomy with scar revision subsequently.  The pain is located right sided and has localized more to the lower quadrant. Patient describes the pain as constant and rated as moderate.   She denies fevers or chills.  Patient Active Problem List   Diagnosis Date Noted  . Acute appendicitis 10/11/2016  . Rash and nonspecific skin eruption   . Headache 05/29/2015  . Sepsis ([place]) 05/29/2015  . Nausea, vomiting and diarrhea 05/29/2015  . Pressure in head   . Vomiting and diarrhea   . Erythrocytosis 05/28/2015  . Back stiffness 05/28/2015  . Neck stiffness 05/28/2015  . Vision changes 05/28/2015  . Leukocytosis 05/28/2015  . Meningitis like reaction 05/27/2015  . Breast nodule 03/25/2014  . Vaginal discharge 03/18/2014  . Vaginal itching 03/18/2014  . BV (bacterial vaginosis) 03/18/2014  . Pelvic peritoneal adhesions, female 02/19/2013  . Status post hysterectomy 02/19/2013   Past Medical History:  Diagnosis Date  . Breast nodule 03/25/2014  . BV (bacterial vaginosis) 03/18/2014  . Hypoglycemia   . Vaginal discharge 03/18/2014  . Vaginal itching 03/18/2014    Past Surgical History:  Procedure Laterality Date  . ABDOMINAL HYSTERECTOMY    . CESAREAN SECTION  2009, 2012   x2  . LYSIS OF ADHESION N/A 02/19/2013   Procedure: LYSIS OF ADHESION;  Surgeon: [name], MD;  Location: [facility];  Service: Gynecology;  Laterality: N/A;  . MASS EXCISION N/A 07/23/2012   Procedure: EXCISION OF RIGHT LOWER ABDOMINAL MASS, ENDOMETRIAL IMPLANT;  Surgeon: Scherry Ran, MD;  Location: [facility];  Service: General;   Laterality: N/A;  . SCAR REVISION N/A 02/19/2013   Procedure: EXCISION OF SCAR ENDOMETRIOSIS;  Surgeon: [name], MD;  Location: [facility];  Service: Gynecology;  Laterality: N/A;  . SUPRACERVICAL ABDOMINAL HYSTERECTOMY N/A 02/19/2013   Procedure: HYSTERECTOMY SUPRACERVICAL ABDOMINAL;  Surgeon: [name], MD;  Location: [facility];  Service: Gynecology;  Laterality: N/A;  . TONSILLECTOMY    . TUBAL LIGATION       (Not in a hospital admission) Allergies  Allergen Reactions  . Codeine Hives and Itching    Social History  Substance Use Topics  . Smoking status: Never Smoker  . Smokeless tobacco: Never Used  . Alcohol use No    Family History  Problem Relation Age of Onset  . Hypertension Father   . Diabetes Other   . Heart disease Other   . Colon cancer Other   . Heart disease Other     Review of Systems A comprehensive review of systems was negative except for: Gastrointestinal: positive for abdominal pain and nausea  Objective:   Patient Vitals for the past 8 hrs:  BP Temp Temp src Pulse Resp SpO2 Height Weight  10/11/16 1030 (!) 94/46 - - 60 - 95 % - -  10/11/16 1000 (!) 96/55 - - 73 - 97 % - -  10/11/16 0809 112/75 97.9 F (36.6 C) Oral 79 17 100 % _0  (1.549 m) 215 lb (97.5 kg)  10/11/16 0640 (!) 103/57 - - 71 17 100 % - -  Physical Exam  Constitutional: She is oriented to person, place, and time and well-developed, well-nourished, and in no distress. No distress.  HENT:  Head: Normocephalic and atraumatic.  Eyes: Pupils are equal, round, and reactive to light.  Cardiovascular: Normal rate and regular rhythm.   Pulmonary/Chest: Effort normal and breath sounds normal.  Abdominal: Soft. She exhibits no distension. There is tenderness. There is no rebound and no guarding.  Right lower quadrant and epigastic tenderness   Musculoskeletal: Normal range of motion.  Neurological: She is alert and oriented to person, place, and time.  Skin: Skin is warm and  dry.  Psychiatric: Memory, affect and judgment normal.     Data Review:  Results for orders placed or performed during the hospital encounter of 10/11/16 (from the past 48 hour(s))  Urinalysis, Routine w reflex microscopic     Status: None   Collection Time: 10/11/16  4:34 AM  Result Value Ref Range   Color, Urine YELLOW YELLOW   APPearance CLEAR CLEAR   Specific Gravity, Urine 1.012 1.005 - 1.030   pH 5.0 5.0 - 8.0   Glucose, UA NEGATIVE NEGATIVE mg/dL   Hgb urine dipstick NEGATIVE NEGATIVE   Bilirubin Urine NEGATIVE NEGATIVE   Ketones, ur NEGATIVE NEGATIVE mg/dL   Protein, ur NEGATIVE NEGATIVE mg/dL   Nitrite NEGATIVE NEGATIVE   Leukocytes, UA NEGATIVE NEGATIVE  Lipase, blood     Status: None   Collection Time: 10/11/16  4:39 AM  Result Value Ref Range   Lipase 28 11 - 51 U/L  Comprehensive metabolic panel     Status: Abnormal   Collection Time: 10/11/16  4:39 AM  Result Value Ref Range   Sodium 138 135 - 145 mmol/L   Potassium 4.1 3.5 - 5.1 mmol/L   Chloride 102 101 - 111 mmol/L   CO2 28 22 - 32 mmol/L   Glucose, Bld 135 (H) 65 - 99 mg/dL   BUN 13 6 - 20 mg/dL   Creatinine, Ser 0.91 0.44 - 1.00 mg/dL   Calcium 9.2 8.9 - 10.3 mg/dL   Total Protein 6.9 6.5 - 8.1 g/dL   Albumin 4.0 3.5 - 5.0 g/dL   AST 16 15 - 41 U/L   ALT 17 14 - 54 U/L   Alkaline Phosphatase 70 38 - 126 U/L   Total Bilirubin 0.5 0.3 - 1.2 mg/dL   GFR calc non Af Amer >60 >60 mL/min   GFR calc Af Amer >60 >60 mL/min    Comment: (NOTE) The eGFR has been calculated using the CKD EPI equation. This calculation has not been validated in all clinical situations. eGFR's persistently <60 mL/min signify possible Chronic Kidney Disease.    Anion gap 8 5 - 15  CBC     Status: Abnormal   Collection Time: 10/11/16  4:39 AM  Result Value Ref Range   WBC 13.2 (H) 4.0 - 10.5 K/uL   RBC 4.97 3.87 - 5.11 MIL/uL   Hemoglobin 14.4 12.0 - 15.0 g/dL   HCT 41.8 36.0 - 46.0 %   MCV 84.1 78.0 - 100.0 fL   MCH 29.0  26.0 - 34.0 pg   MCHC 34.4 30.0 - 36.0 g/dL   RDW 13.0 11.5 - 15.5 %   Platelets 245 150 - 400 K/uL     Studies/Results: Ct Abdomen Pelvis W Contrast  Result Date: 10/11/2016 CLINICAL DATA:  Right abdominal pain radiating around to the back. Nausea. EXAM: CT ABDOMEN AND PELVIS WITH CONTRAST TECHNIQUE: Multidetector CT imaging of the  abdomen and pelvis was performed using the standard protocol following bolus administration of intravenous contrast. CONTRAST:  13m ISOVUE-300 IOPAMIDOL (ISOVUE-300) INJECTION 61% COMPARISON:  Multiple exams, including ultrasound of 10/11/2016 and CT scan from 05/31/2015 FINDINGS: Lower chest: Unremarkable Hepatobiliary: Unremarkable Pancreas: Unremarkable Spleen: Unremarkable Adrenals/Urinary Tract: Unremarkable Stomach/Bowel: Abnormal thickened appendix at 1.1 cm in diameter with mild proximal periappendiceal stranding, compatible with acute appendicitis. The Vascular/Lymphatic: Right common iliac node 0.8 cm in short axis. Right external iliac node 0.8 cm in short axis. Ileo colic node 1.1 cm in short axis on image 46/2. Reproductive: 2.4 by 1.9 cm hypodense lesion associated with the left ovary. Partial hysterectomy noted. Other: Trace free pelvic fluid. Musculoskeletal: Unremarkable IMPRESSION: 1. Acute appendicitis, appendiceal diameter 1.1 cm, with mild periappendiceal stranding especially adjacent to the proximal appendix. Nearby reactive lymph nodes noted. Trace free pelvic fluid. 2. 2.4 cm left ovarian cyst, this requires no further workup. These results were called by telephone at the time of interpretation on 10/11/2016 at 11:27 am to Dr. DJulianne Rice, who verbally acknowledged these results. Electronically Signed   By: WVan ClinesM.D.   On: 10/11/2016 11:27   UKoreaAbdomen Limited Ruq  Result Date: 10/11/2016 CLINICAL DATA:  Right upper quadrant pain EXAM: ULTRASOUND ABDOMEN LIMITED RIGHT UPPER QUADRANT COMPARISON:  None. FINDINGS: Gallbladder: No  gallstones or wall thickening visualized. No sonographic Murphy sign noted by sonographer. Common bile duct: Diameter: Normal caliber, 4 mm Liver: No focal lesion identified. Within normal limits in parenchymal echogenicity. Portal vein is patent on color Doppler imaging with normal direction of blood flow towards the liver. IMPRESSION: Unremarkable right upper quadrant ultrasound. Electronically Signed   By: KRolm BaptiseM.D.   On: 10/11/2016 08:58    Anti-infectives: Anti-infectives    Start     Dose/Rate Route Frequency Ordered Stop   10/11/16 1215  piperacillin-tazobactam (ZOSYN) IVPB 3.375 g     3.375 g 100 mL/hr over 30 Minutes Intravenous  Once 10/11/16 1200         Assessment:   Principal Problem:   Acute appendicitis   Plan:   -OR for laparoscopic appendectomy, possible open, discussed risk and benefits of bleeding, infection, injury to other organs, given epigastric pain too, will assess for any other endometrial implants etc -Zosyn in the ED, will plan for preop dose of antibiotics -Preop orders performed  -May stay overnight for observation pending how she does   LCurlene LabrumMD

## 2016-10-11 NOTE — ED Provider Notes (Signed)
[facility] DEPT Provider Note   CSN: [id number] Arrival date & time: 10/11/16  0356     History   Chief Complaint Chief Complaint  Patient presents with  . Abdominal Pain    HPI [name] is a 31 y.o. female.  The history is provided by the patient.  She had onset right upper quadrant pain following dinner last night. She had eaten chicken, broccoli and cheese, and corn. Pain radiates to the back. She rates it at 9/10. There is associated nausea but no vomiting. Pain is better she calls up in a ball, worse if she lies on her right or left side. She denies fever or chills and has not broken out in a sweat. She's never had pain like this before. She denies prior history of fatty food intolerance.  Past Medical History:  Diagnosis Date  . Breast nodule 03/25/2014  . BV (bacterial vaginosis) 03/18/2014  . Hypoglycemia   . Vaginal discharge 03/18/2014  . Vaginal itching 03/18/2014    Patient Active Problem List   Diagnosis Date Noted  . Rash and nonspecific skin eruption   . Headache 05/29/2015  . Sepsis ([place]) 05/29/2015  . Nausea, vomiting and diarrhea 05/29/2015  . Pressure in head   . Vomiting and diarrhea   . Erythrocytosis 05/28/2015  . Back stiffness 05/28/2015  . Neck stiffness 05/28/2015  . Vision changes 05/28/2015  . Leukocytosis 05/28/2015  . Meningitis like reaction 05/27/2015  . Breast nodule 03/25/2014  . Vaginal discharge 03/18/2014  . Vaginal itching 03/18/2014  . BV (bacterial vaginosis) 03/18/2014  . Pelvic peritoneal adhesions, female 02/19/2013  . Status post hysterectomy 02/19/2013    Past Surgical History:  Procedure Laterality Date  . ABDOMINAL HYSTERECTOMY    . CESAREAN SECTION  2009, 2012   x2  . LYSIS OF ADHESION N/A 02/19/2013   Procedure: LYSIS OF ADHESION;  Surgeon: [name], MD;  Location: [facility];  Service: Gynecology;  Laterality: N/A;  . MASS EXCISION N/A 07/23/2012   Procedure: EXCISION OF RIGHT LOWER ABDOMINAL MASS,  ENDOMETRIAL IMPLANT;  Surgeon: Scherry Ran, MD;  Location: [facility];  Service: General;  Laterality: N/A;  . SCAR REVISION N/A 02/19/2013   Procedure: EXCISION OF SCAR ENDOMETRIOSIS;  Surgeon: [name], MD;  Location: [facility];  Service: Gynecology;  Laterality: N/A;  . SUPRACERVICAL ABDOMINAL HYSTERECTOMY N/A 02/19/2013   Procedure: HYSTERECTOMY SUPRACERVICAL ABDOMINAL;  Surgeon: [name], MD;  Location: [facility];  Service: Gynecology;  Laterality: N/A;  . TONSILLECTOMY    . TUBAL LIGATION      OB History    Gravida Para Term Preterm AB Living   2 2           SAB TAB Ectopic Multiple Live Births                   Home Medications    Prior to Admission medications   Medication Sig Start Date End Date Taking? Authorizing Provider  ibuprofen (ADVIL,MOTRIN) 200 MG tablet Take 200-400 mg by mouth every 6 (six) hours as needed for mild pain.    [provider]  metoCLOPramide (REGLAN) 10 MG tablet Take 1 tablet (10 mg total) by mouth every 6 (six) hours as needed. 5/00/93   Delora Fuel, MD    Family History Family History  Problem Relation Age of Onset  . Hypertension Father   . Diabetes Other   . Heart disease Other   . Colon cancer Other   .  Heart disease Other     Social History Social History  Substance Use Topics  . Smoking status: Never Smoker  . Smokeless tobacco: Never Used  . Alcohol use No     Allergies   Codeine   Review of Systems Review of Systems  All other systems reviewed and are negative.    Physical Exam Updated Vital Signs BP (!) 127/57 (BP Location: Right Arm)   Pulse 84   Temp 98.2 F (36.8 C) (Oral)   Resp 18   Ht 5\' 1"  (1.549 m)   Wt 96.6 kg (213 lb)   LMP 01/08/2013   SpO2 100%   BMI 40.25 kg/m   Physical Exam  Nursing note and vitals reviewed.  31 year old female, resting comfortably and in no acute distress. Vital signs are normal. Oxygen saturation is 100%, which is normal. Head is normocephalic and  atraumatic. PERRLA, EOMI. Oropharynx is clear. Neck is nontender and supple without adenopathy or JVD. Back is nontender and there is no CVA tenderness. Lungs are clear without rales, wheezes, or rhonchi. Chest is nontender. Heart has regular rate and rhythm without murmur. Abdomen is soft, flat, with mild epigastric tenderness and moderate to severe right upper quadrant tenderness with a positive Murphy sign. There are no masses or hepatosplenomegaly and peristalsis is hypoactive. Extremities have no cyanosis or edema, full range of motion is present. Skin is warm and dry without rash. Neurologic: Mental status is normal, cranial nerves are intact, there are no motor or sensory deficits.  ED Treatments / Results  Labs (all labs ordered are listed, but only abnormal results are displayed) Labs Reviewed  COMPREHENSIVE METABOLIC PANEL - Abnormal; Notable for the following:       Result Value   Glucose, Bld 135 (*)    All other components within normal limits  CBC - Abnormal; Notable for the following:    WBC 13.2 (*)    All other components within normal limits  LIPASE, BLOOD  URINALYSIS, ROUTINE W REFLEX MICROSCOPIC   Radiology No results found.  Procedures Procedures (including critical care time)  Medications Ordered in ED Medications  morphine 4 MG/ML injection 4 mg (not administered)  sodium chloride 0.9 % bolus 1,000 mL (0 mLs Intravenous Stopped 10/11/16 0724)  ondansetron (ZOFRAN) injection 4 mg (4 mg Intravenous Given 10/11/16 0627)  morphine 2 MG/ML injection (4 mg Intravenous Given 10/11/16 5176)   Initial Impression / Assessment and Plan / ED Course  I have reviewed the triage vital signs and the nursing notes.  Pertinent labs & imaging results that were available during my care of the patient were reviewed by me and considered in my medical decision making (see chart for details).  Right upper quadrant pain concerning for possible biliary colic. Old records are  reviewed, and she did have a CT of abdomen and pelvis in May 2017 which did not show any biliary tract disease. She will be given IV fluids, morphine, ondansetron. She will be sent for ultrasound of abdomen to look for possible cholelithiasis. Case is signed out to Dr. Lita Mains.  Final Clinical Impressions(s) / ED Diagnoses   Final diagnoses:  RUQ pain    New Prescriptions New Prescriptions   No medications on file     Delora Fuel, MD 16/07/37 [phone]

## 2016-10-12 ENCOUNTER — Encounter (HOSPITAL_COMMUNITY): Payer: Self-pay | Admitting: General Surgery

## 2016-10-12 NOTE — Progress Notes (Signed)
Pt discharged home with personal belongings, prescriptions, IV removed and site intact.

## 2016-10-12 NOTE — Discharge Summary (Signed)
Physician Discharge Summary  Patient ID: [name] MRN: [id number] DOB/AGE: April 19, 1985 31 y.o.  Admit date: 10/11/2016 Discharge date: 10/12/2016  Admission Diagnoses:  Discharge Diagnoses:  Principal Problem:   Acute appendicitis   Discharged Condition: good  Hospital Course: [name] is a 78 you who presented with early acute appendicitis to the ED. She was taken to the OR and a laparoscopic appendectomy was performed. She did well post operatively, tolerated a diet, and had good pain control.   Consults: None  Significant Diagnostic Studies: None  Treatments: surgery: Laparoscopic appendectomy   Discharge Exam: Blood pressure (!) 96/50, pulse 91, temperature 97.8 F (36.6 C), temperature source Oral, resp. rate 15, height 5\' 1"  (1.549 m), weight 215 lb (97.5 kg), last menstrual period 01/08/2013, SpO2 98 %. General appearance: alert, cooperative, appears stated age and no distress Resp: normal work breathing GI: soft, appropriately tender, dermabond c/d/i  Extremities: extremities normal, atraumatic, no cyanosis or edema Skin: Skin color, texture, turgor normal. No rashes or lesions  Disposition: 01-Home or Self Care  Discharge Instructions    Call MD for:  difficulty breathing, headache or visual disturbances    Complete by:  As directed    Call MD for:  persistant dizziness or light-headedness    Complete by:  As directed    Call MD for:  persistant nausea and vomiting    Complete by:  As directed    Call MD for:  redness, tenderness, or signs of infection (pain, swelling, redness, odor or green/yellow discharge around incision site)    Complete by:  As directed    Call MD for:  severe uncontrolled pain    Complete by:  As directed    Call MD for:  temperature >100.4    Complete by:  As directed    Discharge instructions    Complete by:  As directed    Please shower per your normal routine. Do not pick or remove glue. Take Colace over the counter as needed  for constipation while using narcotics. Alternate tylenol and ibuprofen for pain control and take roxicodone as needed for more severe pain. Diet as tolerated. Follow up as scheduled.   Increase activity slowly    Complete by:  As directed      Allergies as of 10/12/2016      Reactions   Codeine Hives, Itching      Medication List    TAKE these medications   ibuprofen 200 MG tablet Commonly known as:  ADVIL,MOTRIN Take 200-400 mg by mouth every 6 (six) hours as needed for mild pain.   metoCLOPramide 10 MG tablet Commonly known as:  REGLAN Take 1 tablet (10 mg total) by mouth every 6 (six) hours as needed. What changed:  reasons to take this   oxyCODONE 5 MG immediate release tablet Commonly known as:  Oxy IR/ROXICODONE Take 1 tablet (5 mg total) by mouth every 4 (four) hours as needed for moderate pain.            Discharge Care Instructions        Start     Ordered   10/12/16 0000  Increase activity slowly     10/12/16 8469   10/12/16 0000  Discharge instructions    Comments:  Please shower per your normal routine. Do not pick or remove glue. Take Colace over the counter as needed for constipation while using narcotics. Alternate tylenol and ibuprofen for pain control and take roxicodone as needed for more severe pain. Diet as  tolerated. Follow up as scheduled.   10/12/16 9021   10/12/16 0000  Call MD for:  temperature >100.4     10/12/16 1155   10/12/16 0000  Call MD for:  persistant nausea and vomiting     10/12/16 0938   10/12/16 0000  Call MD for:  severe uncontrolled pain     10/12/16 0938   10/12/16 0000  Call MD for:  redness, tenderness, or signs of infection (pain, swelling, redness, odor or green/yellow discharge around incision site)     10/12/16 0938   10/12/16 0000  Call MD for:  difficulty breathing, headache or visual disturbances     10/12/16 0938   10/12/16 0000  Call MD for:  persistant dizziness or light-headedness     10/12/16 0938    10/11/16 0000  oxyCODONE (OXY IR/ROXICODONE) 5 MG immediate release tablet  Every 4 hours PRN     10/11/16 1704     Follow-up Information    Virl Cagey, MD. Call on 10/25/2016.   Specialty:  General Surgery Why:  at 9:00 am Contact information: [address] [place] [address] [phone]           Signed: Virl Cagey 10/12/2016, 9:38 AM

## 2016-10-12 NOTE — Op Note (Signed)
[facility]  Date of Surgery: 10/11/2016  Admit Date: 10/11/2016   Performing Service: General  Surgeon(s) and Role:    * Virl Cagey, MD - Primary   Pre-operative Diagnosis: Acute Appendicitis  Post-operative Diagnosis: Acute Appendicitis  Procedure Performed: Laparoscopic Appendectomy   Surgeon: Lanell Matar. Constance Haw, MD   Assistant: No qualified resident was available.   Anesthesia: General   Findings:  The appendix was found to be inflamed. There were not signs of necrosis. There was not perforation. There was not abscess formation.   Estimated Blood Loss: less than 50 mL   Specimens:  ID Type Source Tests Collected by Time Destination  1 : appendix GI Appendix SURGICAL PATHOLOGY Virl Cagey, MD 9/32/6712 [id number]      Complications: None; patient tolerated the procedure well.   Disposition: PACU - hemodynamically stable.   Condition: stable   Indications: The patient presented with a 1 day history of right-sided abdominal pain. A CT scan and exam revealed findings consistent with acute appendicitis.   Procedure Details  Prior to the procedure, the risks, benefits, complications, treatment options, and expected outcomes were discussed with the patient and/or family, including but not limited to the risk of bleeding, infection, finding of a normal appendix, and the need for conversion to an open procedure. There was concurrence with the proposed plan and informed consent was obtained. The patient was taken to the operating room, identified as [name] and the procedure verified as Laproscopic Appendectomy.    The patient was placed in the supine position and general anesthesia was induced, along with placement of orogastric tube, SCD's, and a Foley catheter. The abdomen was prepped and draped in a sterile fashion. The abdomen was entered with Veress technique in the left upper quadrant. Intraperitoneal placement was confirmed with saline  drop, low entry pressures, and easy insufflation. An incision was made infraumbilical and a 10 mm optiview trocar was placed under direct visualization with a 0 degree scope. The 10 mm 0 degree scope was placed in the abdomen and no evidence of injury was identified. A 12 mm port was placed in the left lower quadrant of the abdomen after skin incision with trocar placement under direct vision. A careful evaluation of the entire abdomen was carried out.  There were extensive adhesions to the anterior abdominal wall from her prior hysterectomy and scar revision for endometriosis.  Omentum was tacked to the abdominal wall and care was taken to avoid this area.  Minimal adhesions on the right side were taken down with the harmonic energy device.  Given the location of the adhesions, the additional 5 mm port was placed in the right upper quadrant in order to triangulate down to the appendiceal bed. The patient was placed in Trendelenburg and left lateral decubitus position. The small intestines were retracted in the cephalad and left lateral direction away from the pelvis and right lower quadrant. The patient was found to have a mildly inflamed intact appendix. There was not evidence of perforation.   The appendix was carefully dissected.  A window was made in the mesoappendix at the base of the appendix. The appendix was divided at its base using another tan endo-GIA stapler. Minimal appendiceal stump was left in place. The mesoappendix was taken with the harmonic energy device. The appendix was placed within an Endocatch specimen bag. There was no evidence of bleeding, leakage, or complication after division of the appendix.  Any remaining blood or pus was suctioned out from the  abdomen, hemostasis was confirmed. The endocatch bag was removed via the 12 mm port.  The abdomen was then re-inspected and the omental adhesions inspected to insure no bleeding.  Everything was hemostatic. Then the abdomen desufflated. The  appendix was passed off the field as a specimen.   The the 12 mm port site was closed with a suture passer and an 0 Vicryl suture. The umbilical 10 mm port site defect was only about 5 mm on palpation and was not closed.  The trocar site skin wounds were closed using subcuticular 4-0 Monocryl suture and dermabond. The patient was then awakened from general anesthesia, extubated, and taken to PACU for recovery.   Instrument, sponge, and needle counts were correct at the conclusion of the case.   Loretta Labrum, MD [facility] [address] [place], [place] [address] [phone] (office)

## 2016-10-12 NOTE — Discharge Instructions (Signed)
Appendicitis The appendix is a tube that is shaped like a finger. It is connected to the large intestine. Appendicitis means that this tube is swollen (inflamed). Without treatment, the tube can tear (rupture). This can lead to a life-threatening infection. It can also cause you to have sores (abscesses). These sores hurt. What are the causes? This condition may be caused by something that blocks the appendix, such as:  A ball of poop (stool).  Lymph glands that are bigger than normal.  Sometimes, the cause is not known. What are the signs or symptoms? Symptoms of this condition include:  Pain around the belly button (navel). ? The pain moves toward the lower right belly (abdomen). ? The pain can get worse with time. ? The pain can get worse if you cough. ? The pain can get worse if you move suddenly.  Tenderness in the lower right belly.  Feeling sick to your stomach (nauseous).  Throwing up (vomiting).  Not feeling hungry (loss of appetite).  A fever.  Having a hard time pooping (constipation).  Watery poop (diarrhea).  Not feeling well.  How is this treated? Usually, this condition is treated by taking out the appendix (appendectomy). There are two ways that the appendix can be taken out:  Open surgery. In this surgery, the appendix is taken out through a large cut (incision). The cut is made in the lower right belly. This surgery may be picked if: ? You have scars from another surgery. ? You have a bleeding condition. ? You are pregnant and will be having your baby soon. ? You have a condition that does not allow the other type of surgery.  Laparoscopic surgery. In this surgery, the appendix is taken out through small cuts. Often, this surgery: ? Causes less pain. ? Causes fewer problems. ? Is easier to heal from.  If your appendix tears and a sore forms:  A drain may be put into the sore. The drain will be used to get rid of fluid.  You may get an antibiotic  medicine through an IV tube.  Your appendix may or may not need to be taken out.  This information is not intended to replace advice given to you by your health care provider. Make sure you discuss any questions you have with your health care provider.  Instructions: Please shower per your normal routine. No baths for the next 2 weeks.  Do not pick or remove glue. Take Colace over the counter as needed for constipation while using narcotics. Alternate tylenol and ibuprofen for pain control and take roxicodone as needed for more severe pain. Diet as tolerated. Follow up as scheduled.  sed: 06/18/2015 Document Reviewed: 05/28/2014 Elsevier Interactive Patient Education  [employer].

## 2016-10-25 ENCOUNTER — Ambulatory Visit: Payer: 59 | Admitting: General Surgery

## 2018-08-22 ENCOUNTER — Ambulatory Visit (INDEPENDENT_AMBULATORY_CARE_PROVIDER_SITE_OTHER): Payer: No Typology Code available for payment source | Admitting: Adult Health

## 2018-08-22 ENCOUNTER — Other Ambulatory Visit (HOSPITAL_COMMUNITY)
Admission: RE | Admit: 2018-08-22 | Discharge: 2018-08-22 | Disposition: A | Discharge status: Home / Self Care | Payer: No Typology Code available for payment source | Source: Ambulatory Visit | Attending: Adult Health | Admitting: Adult Health

## 2018-08-22 ENCOUNTER — Other Ambulatory Visit: Payer: Self-pay

## 2018-08-22 ENCOUNTER — Encounter: Payer: Self-pay | Admitting: Adult Health

## 2018-08-22 VITALS — BP 128/84 | HR 82 | Ht 63.0 in | Wt 220.5 lb

## 2018-08-22 DIAGNOSIS — R4589 Other symptoms and signs involving emotional state: Secondary | ICD-10-CM

## 2018-08-22 DIAGNOSIS — R232 Flushing: Secondary | ICD-10-CM

## 2018-08-22 DIAGNOSIS — K5900 Constipation, unspecified: Secondary | ICD-10-CM

## 2018-08-22 DIAGNOSIS — L678 Other hair color and hair shaft abnormalities: Secondary | ICD-10-CM | POA: Insufficient documentation

## 2018-08-22 DIAGNOSIS — Z7689 Persons encountering health services in other specified circumstances: Secondary | ICD-10-CM | POA: Insufficient documentation

## 2018-08-22 DIAGNOSIS — Z01419 Encounter for gynecological examination (general) (routine) without abnormal findings: Secondary | ICD-10-CM | POA: Diagnosis not present

## 2018-08-22 DIAGNOSIS — R5383 Other fatigue: Secondary | ICD-10-CM

## 2018-08-22 DIAGNOSIS — R635 Abnormal weight gain: Secondary | ICD-10-CM | POA: Insufficient documentation

## 2018-08-22 DIAGNOSIS — Z131 Encounter for screening for diabetes mellitus: Secondary | ICD-10-CM

## 2018-08-22 MED ORDER — SERTRALINE HCL 50 MG PO TABS: 50.0000 mg | ORAL_TABLET | Freq: Every day | ORAL | 6 refills | Status: DC

## 2018-08-22 NOTE — Progress Notes (Signed)
Patient ID: [name], female   DOB: 1985-12-26, 33 y.o.   MRN: [id number] History of Present Illness: [name] is a 33 year old white female, married, sp hysterectomy, which was [facility], and she said she did not know that, in for a well woman gyn exam and pap. She is moody, easy to cry but not depressed, having constipation, decreased energy and dry skin, hair and hair loss.She also says she has decreased sex drive, hot flashes and night sweats and has gained about 25 lbs. She says she tried lexapro after surgery and she could not reach orgasm, so she stopped it and was then prescribed Wellbutrin but never took it. She is Therapist, sports in [place].   PCP is Delman Cheadle PA at [place].   Current Medications, Allergies, Past Medical History, Past Surgical History, Family History and Social History were reviewed in [employer] record.     Review of Systems: Patient denies any headaches, hearing loss, blurred vision, shortness of breath, chest pain, abdominal pain, problems with  urination, or intercourse. No joint pain. See HPI for positives.     Physical Exam:BP 128/84 (BP Location: Left Arm, Patient Position: Sitting, Cuff Size: Normal)   Pulse 82   Ht 5\' 3"  (1.6 m)   Wt 220 lb 8 oz (100 kg)   LMP 01/08/2013 Comment: [place]  BMI 39.06 kg/m  General:  Well developed, well nourished, no acute distress Skin:  Warm and dry,tan, has tattoos  Neck:  Midline trachea, normal thyroid, good ROM, no lymphadenopathy Lungs; Clear to auscultation bilaterally Breast:  No dominant palpable mass, retraction, or nipple discharge Cardiovascular: Regular rate and rhythm Abdomen:  Soft, non tender, no hepatosplenomegaly Pelvic:  External genitalia is normal in appearance, no lesions.  The vagina is normal in appearance. Urethra has no lesions or masses. The cervix is smooth, pap with HPV 16/18 reflex typing performed.  Uterus is absent.  No adnexal masses or tenderness noted.Bladder is non  tender, no masses felt. Extremities/musculoskeletal:  No swelling or varicosities noted, no clubbing or cyanosis Psych:  No mood changes, alert and cooperative,seems happy Fall risk is low PHQ 2 score is 0. Examination chaperoned by Levy Pupa LPN.    Impression and Plan: 1. Encounter for gynecological examination with Papanicolaou smear of cervix Pap with HPV,16/18 reflex genotyping sent Will check labs to rule thyroid issues, which run in her family and early menopause  - CBC - Comprehensive metabolic panel - Thyroid Function Panel (THS+T3+T4+Free) - Hemoglobin A1c Physical in 1 year Pap in 3 if normal Mammogram at 42 Will talk when labs back  2. Moody Will rx zoloft to see if helps with moods Meds ordered this encounter  Medications  . sertraline (ZOLOFT) 50 MG tablet    Sig: Take 1 tablet (50 mg total) by mouth daily.    Dispense:  30 tablet    Refill:  6    Order Specific Question:   Supervising Provider    Answer:   Elonda Husky, LUTHER H [[id number]]    3. Constipation, unspecified constipation type Will check labs and talk when results back  - Thyroid Function Panel (THS+T3+T4+Free)  4. Weight gain - Thyroid Function Panel (THS+T3+T4+Free)  5. Dry hair - Thyroid Function Panel (THS+T3+T4+Free)  6. Hot flashes - Thyroid Function Panel (GUR+K2+H0+WCBJ) - Follicle stimulating hormone  7. Tired - CBC - Comprehensive metabolic panel - Thyroid Function Panel (THS+T3+T4+Free) - Hemoglobin A1c  8. Screening for diabetes mellitus - Hemoglobin A1c

## 2018-08-22 NOTE — Addendum Note (Signed)
Addended by: Linton Rump on: 08/22/2018 11:28 AM   Modules accepted: Orders

## 2018-08-27 LAB — CYTOLOGY - PAP
Adequacy: ABSENT
Diagnosis: NEGATIVE
HPV: NOT DETECTED

## 2018-08-28 LAB — COMPREHENSIVE METABOLIC PANEL
ALT: 15 IU/L (ref 0–32)
AST: 16 IU/L (ref 0–40)
Albumin/Globulin Ratio: 1.9 (ref 1.2–2.2)
Albumin: 4.6 g/dL (ref 3.8–4.8)
Alkaline Phosphatase: 79 IU/L (ref 39–117)
BUN/Creatinine Ratio: 13 (ref 9–23)
BUN: 13 mg/dL (ref 6–20)
Bilirubin Total: 0.5 mg/dL (ref 0.0–1.2)
CO2: 22 mmol/L (ref 20–29)
Calcium: 9.7 mg/dL (ref 8.7–10.2)
Chloride: 101 mmol/L (ref 96–106)
Creatinine, Ser: 0.98 mg/dL (ref 0.57–1.00)
GFR calc Af Amer: 89 mL/min/{1.73_m2} (ref >59)
GFR calc non Af Amer: 77 mL/min/{1.73_m2} (ref >59)
Globulin, Total: 2.4 g/dL (ref 1.5–4.5)
Glucose: 86 mg/dL (ref 65–99)
Potassium: 4.8 mmol/L (ref 3.5–5.2)
Sodium: 139 mmol/L (ref 134–144)
Total Protein: 7 g/dL (ref 6.0–8.5)

## 2018-08-28 LAB — CBC
Hematocrit: 44.9 % (ref 34.0–46.6)
Hemoglobin: 15.4 g/dL (ref 11.1–15.9)
MCH: 29.1 pg (ref 26.6–33.0)
MCHC: 34.3 g/dL (ref 31.5–35.7)
MCV: 85 fL (ref 79–97)
Platelets: 257 10*3/uL (ref 150–450)
RBC: 5.3 x10E6/uL — ABNORMAL HIGH (ref 3.77–5.28)
RDW: 12.9 % (ref 11.7–15.4)
WBC: 9.4 10*3/uL (ref 3.4–10.8)

## 2018-08-28 LAB — HEMOGLOBIN A1C
Est. average glucose Bld gHb Est-mCnc: 108 mg/dL
Hgb A1c MFr Bld: 5.4 % (ref 4.8–5.6)

## 2018-08-28 LAB — FOLLICLE STIMULATING HORMONE: FSH: 3.1 m[IU]/mL

## 2018-08-28 LAB — TSH+T3+FREE T4+T3 FREE
Free T-3: 3.2 pg/mL
Free T4 by Dialysis: 1.1 ng/dL
TSH: 2.6 uU/mL
Triiodothyronine (T-3), Serum: 167 ng/dL

## 2019-02-12 ENCOUNTER — Other Ambulatory Visit: Payer: Self-pay

## 2019-02-12 ENCOUNTER — Ambulatory Visit (INDEPENDENT_AMBULATORY_CARE_PROVIDER_SITE_OTHER): Payer: No Typology Code available for payment source | Admitting: Adult Health

## 2019-02-12 ENCOUNTER — Encounter: Payer: Self-pay | Admitting: Adult Health

## 2019-02-12 VITALS — BP 128/87 | HR 79 | Ht 60.0 in | Wt 215.4 lb

## 2019-02-12 DIAGNOSIS — R4589 Other symptoms and signs involving emotional state: Secondary | ICD-10-CM

## 2019-02-12 MED ORDER — SERTRALINE HCL 50 MG PO TABS: 50.0000 mg | ORAL_TABLET | Freq: Every day | ORAL | 4 refills | Status: DC

## 2019-02-12 NOTE — Progress Notes (Signed)
  Subjective:     Patient ID: Selinda Orion, female   DOB: 1985-08-28, 34 y.o.   MRN: [id number]  HPI Josaphine is a 34 year old white female,married back in follow up on starting zoloft for moods, and doing great.  PCP is Delman Cheadle PA.   Review of Systems Doing great with Zoloft, not as moody Reviewed past medical,surgical, social and family history. Reviewed medications and allergies.     Objective:   Physical Exam BP 128/87 (BP Location: Left Arm, Patient Position: Sitting, Cuff Size: Large)   Pulse 79   Ht 5' (1.524 m)   Wt 215 lb 6.4 oz (97.7 kg)   LMP 01/08/2013 Comment: [place]  BMI 42.07 kg/m   Skin warm and dry. Lungs: clear to ausculation bilaterally. Cardiovascular: regular rate and rhythm.   PHQ 2 score is 0.  Assessment:     1. Moody Will continue zoloft Meds ordered this encounter  Medications  . sertraline (ZOLOFT) 50 MG tablet    Sig: Take 1 tablet (50 mg total) by mouth daily.    Dispense:  90 tablet    Refill:  4    Order Specific Question:   Supervising Provider    Answer:   Florian Buff [[id number]]      Plan:     Follow up in about 6 months for physical

## 2019-12-20 ENCOUNTER — Other Ambulatory Visit: Payer: Self-pay

## 2019-12-20 ENCOUNTER — Encounter (HOSPITAL_COMMUNITY): Payer: Self-pay

## 2019-12-20 ENCOUNTER — Emergency Department (HOSPITAL_COMMUNITY)
Admission: EM | Admit: 2019-12-20 | Discharge: 2019-12-20 | Disposition: A | Discharge status: Home / Self Care | Payer: BC Managed Care – PPO | Attending: Emergency Medicine | Admitting: Emergency Medicine

## 2019-12-20 DIAGNOSIS — L299 Pruritus, unspecified: Secondary | ICD-10-CM | POA: Diagnosis not present

## 2019-12-20 DIAGNOSIS — R21 Rash and other nonspecific skin eruption: Secondary | ICD-10-CM | POA: Diagnosis not present

## 2019-12-20 MED ORDER — DOXYCYCLINE HYCLATE 100 MG PO TABS
100.0000 mg | ORAL_TABLET | Freq: Once | ORAL | Status: AC
  Administered 2019-12-20: 100 mg via ORAL
  Filled 2019-12-20: qty 1

## 2019-12-20 MED ORDER — VALACYCLOVIR HCL 1 G PO TABS: 1000.0000 mg | ORAL_TABLET | Freq: Three times a day (TID) | ORAL | 0 refills | Status: AC

## 2019-12-20 MED ORDER — VALACYCLOVIR HCL 500 MG PO TABS
1000.0000 mg | ORAL_TABLET | Freq: Every day | ORAL | Status: DC
  Administered 2019-12-20: 1000 mg via ORAL
  Filled 2019-12-20: qty 2

## 2019-12-20 MED ORDER — DOXYCYCLINE HYCLATE 100 MG PO CAPS: 100.0000 mg | ORAL_CAPSULE | Freq: Two times a day (BID) | ORAL | 0 refills | Status: AC

## 2019-12-20 NOTE — ED Provider Notes (Signed)
[facility] EMERGENCY DEPARTMENT Provider Note   CSN: [id number] Arrival date & time: 12/20/19  1948     History Chief Complaint  Patient presents with  . 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  She reports she had a tattoo done on the right forearm approximately 1 week ago.  Approximately 3 to 4 days after finishing the tattoo, on Monday, she began seeing small red painful spots on her arm.  There is a formed into vesicles and spread up the arm and now are under the axilla.  She has never had this happen before.  She denies fevers or chills.  He says the spots itch and burn.  She has tried some Benadryl cream at home which gives her very small temporary relief.  She denies any prior history of shingles or herpes zoster outbreaks.  She lives with her husband and children, none of whom have any similar type of rash.  She reports that she did get a tattoo placed at a tattoo parlor that she believes adhered to sanitary customs.  HPI     Past Medical History:  Diagnosis Date  . Breast nodule 03/25/2014  . BV (bacterial vaginosis) 03/18/2014  . Hypoglycemia   . Vaginal discharge 03/18/2014  . Vaginal itching 03/18/2014    Patient Active Problem List   Diagnosis Date Noted  . Encounter for gynecological examination with Papanicolaou smear of cervix 08/22/2018  . Moody 08/22/2018  . Constipation 08/22/2018  . Weight gain 08/22/2018  . Dry hair 08/22/2018  . Hot flashes 08/22/2018  . Rash and nonspecific skin eruption   . Headache 05/29/2015  . Sepsis ([place]) 05/29/2015  . Nausea, vomiting and diarrhea 05/29/2015  . Pressure in head   . Vomiting and diarrhea   . Erythrocytosis 05/28/2015  . Back stiffness 05/28/2015  . Neck stiffness 05/28/2015  . Vision changes 05/28/2015  . Leukocytosis 05/28/2015  . Meningitis like reaction 05/27/2015  . Breast nodule 03/25/2014  . Vaginal discharge  03/18/2014  . Vaginal itching 03/18/2014  . BV (bacterial vaginosis) 03/18/2014  . Pelvic peritoneal adhesions, female 02/19/2013  . Status post hysterectomy 02/19/2013    Past Surgical History:  Procedure Laterality Date  . ABDOMINAL HYSTERECTOMY    . CESAREAN SECTION  2009, 2012   x2  . LAPAROSCOPIC APPENDECTOMY N/A 10/11/2016   Procedure: APPENDECTOMY LAPAROSCOPIC;  Surgeon: Virl Cagey, MD;  Location: [facility];  Service: General;  Laterality: N/A;  . LYSIS OF ADHESION N/A 02/19/2013   Procedure: LYSIS OF ADHESION;  Surgeon: [name], MD;  Location: [facility];  Service: Gynecology;  Laterality: N/A;  . MASS EXCISION N/A 07/23/2012   Procedure: EXCISION OF RIGHT LOWER ABDOMINAL MASS, ENDOMETRIAL IMPLANT;  Surgeon: Scherry Ran, MD;  Location: [facility];  Service: General;  Laterality: N/A;  . SCAR REVISION N/A 02/19/2013   Procedure: EXCISION OF SCAR ENDOMETRIOSIS;  Surgeon: [name], MD;  Location: [facility];  Service: Gynecology;  Laterality: N/A;  . SUPRACERVICAL ABDOMINAL HYSTERECTOMY N/A 02/19/2013   Procedure: HYSTERECTOMY SUPRACERVICAL ABDOMINAL;  Surgeon: [name], MD;  Location: [facility];  Service: Gynecology;  Laterality: N/A;  . TONSILLECTOMY    . TUBAL LIGATION       OB History    Gravida  2   Para  2   Term  2   Preterm      AB  Living  2     SAB      TAB      Ectopic      Multiple      Live Births  2           Family History  Problem Relation Age of Onset  . Hypertension Father   . Diabetes Other   . Heart disease Other   . Colon cancer Other   . Heart disease Other   . Colon cancer Paternal Grandmother   . Diabetes Maternal Grandmother   . Heart disease Maternal Grandmother   . Heart disease Maternal Grandfather     Social History   Tobacco Use  . Smoking status: Never Smoker  . Smokeless tobacco: Never Used  Vaping Use  . Vaping Use: Never used  Substance Use Topics  . Alcohol use: No  . Drug use: No     Home Medications Prior to Admission medications   Medication Sig Start Date End Date Taking? Authorizing Provider  doxycycline (VIBRAMYCIN) 100 MG capsule Take 1 capsule (100 mg total) by mouth 2 (two) times daily for 7 days. 12/20/19 12/27/19  Wyvonnia Dusky, MD  ibuprofen (ADVIL,MOTRIN) 200 MG tablet Take 200-400 mg by mouth every 6 (six) hours as needed for mild pain.    [provider]  sertraline (ZOLOFT) 50 MG tablet Take 1 tablet (50 mg total) by mouth daily. 02/12/19   Estill Dooms, NP  valACYclovir (VALTREX) 1000 MG tablet Take 1 tablet (1,000 mg total) by mouth 3 (three) times daily for 7 days. 12/20/19 12/27/19  Wyvonnia Dusky, MD    Allergies    Codeine  Review of Systems   Review of Systems  Constitutional: Negative for chills and fever.  Musculoskeletal: Negative for arthralgias and myalgias.  Skin: Positive for rash and wound.  Neurological: Negative for syncope and headaches.  Psychiatric/Behavioral: Negative for agitation and confusion.  All other systems reviewed and are negative.   Physical Exam Updated Vital Signs BP 134/84   Pulse 76   Temp 98.5 F (36.9 C) (Oral)   Resp 16   Ht 5\' 1"  (1.549 m)   Wt 104.3 kg   LMP 01/08/2013 Comment: [place]  SpO2 97%   BMI 43.46 kg/m   Physical Exam Vitals and nursing note reviewed.  Constitutional:      General: She is not in acute distress.    Appearance: She is well-developed.  HENT:     Head: Normocephalic and atraumatic.  Eyes:     Conjunctiva/sclera: Conjunctivae normal.  Cardiovascular:     Rate and Rhythm: Normal rate and regular rhythm.  Pulmonary:     Effort: Pulmonary effort is normal. No respiratory distress.  Musculoskeletal:     Cervical back: Neck supple.  Skin:    General: Skin is warm and dry.     Comments: Small vesicular lesions on right upper arm and under right axilla, tender to touch Honey-crusting around one lesion Tattoos on right arm  Neurological:     Mental  Status: She is alert.  Psychiatric:        Mood and Affect: Mood normal.        Behavior: Behavior normal.     ED Results / Procedures / Treatments   Labs (all labs ordered are listed, but only abnormal results are displayed) Labs Reviewed - No data to display  EKG None  Radiology No results found.  Procedures Procedures (including critical care time)  Medications Ordered in ED  Medications  valACYclovir (VALTREX) tablet 1,000 mg (1,000 mg Oral Given 12/20/19 2208)  doxycycline (VIBRA-TABS) tablet 100 mg (100 mg Oral Given 12/20/19 2209)    ED Course  I have reviewed the triage vital signs and the nursing notes.  Pertinent labs & imaging results that were available during my care of the patient were reviewed by me and considered in my medical decision making (see chart for details).  34 yo female here with vesicular lesions on her arm in the setting of recent tattoo performed on same arm and region 4 days prior.  These can be consistent with a staph skin infection vs herpes zoster.  We will treat empirically for both.  Doxycycline for 7 days plus valtrex.  Advised she keep it covered and discussed contagion precautions.  Less likely scabies with no one else in the house having the same issue, including her husband.      Final Clinical Impression(s) / ED Diagnoses Final diagnoses:  Rash    Rx / DC Orders ED Discharge Orders         Ordered    doxycycline (VIBRAMYCIN) 100 MG capsule  2 times daily        12/20/19 2153    valACYclovir (VALTREX) 1000 MG tablet  3 times daily        12/20/19 2153           Wyvonnia Dusky, MD 12/21/19 [phone]

## 2019-12-20 NOTE — ED Notes (Signed)
MD at bedside. 

## 2019-12-20 NOTE — Discharge Instructions (Signed)
Your arm rash may be an early outbreak of shingles, or may also be a skin infection caused by the tattoo placement.  I decided to cover you with treatment for both.  You will complete 7 days of doxycycline for a skin infection/staph infection coverage.  You can also take 7 days of valtrex for a possible shingles outbreak.  I recommend that you keep your arm covered at all times at home with light, loose clothing.  Shingles can be contagious for 2 to 3 weeks, particularly when the vesicles are popping, and you should avoid direct contact skin contact with other people during that time.  You can take Benadryl for itching at home.  You can also use aloe cream on your arm.  Try cooling packs or ice packs for a few minutes at a time which may help relieve some of the soreness and burning.

## 2019-12-20 NOTE — ED Triage Notes (Signed)
Pt to er, pt states that she is here for a rash on her R arm, states that the rash started around Monday/tuesday, states that they burn and itch.  Pt states that heat makes the sensation worse. Rash appears as diffuse pustules with some crust.

## 2020-04-01 ENCOUNTER — Other Ambulatory Visit: Payer: Self-pay | Admitting: Adult Health

## 2020-04-06 ENCOUNTER — Encounter: Payer: Self-pay | Admitting: Emergency Medicine

## 2020-04-06 ENCOUNTER — Ambulatory Visit
Admission: EM | Admit: 2020-04-06 | Discharge: 2020-04-06 | Disposition: A | Discharge status: Home / Self Care | Payer: BC Managed Care – PPO | Attending: Family Medicine | Admitting: Family Medicine

## 2020-04-06 ENCOUNTER — Other Ambulatory Visit: Payer: Self-pay

## 2020-04-06 DIAGNOSIS — R059 Cough, unspecified: Secondary | ICD-10-CM | POA: Diagnosis not present

## 2020-04-06 DIAGNOSIS — R0981 Nasal congestion: Secondary | ICD-10-CM

## 2020-04-06 DIAGNOSIS — R490 Dysphonia: Secondary | ICD-10-CM

## 2020-04-06 DIAGNOSIS — J309 Allergic rhinitis, unspecified: Secondary | ICD-10-CM

## 2020-04-06 MED ORDER — FLUTICASONE PROPIONATE 50 MCG/ACT NA SUSP: 2.0000 | Freq: Every day | NASAL | 0 refills | Status: DC

## 2020-04-06 MED ORDER — CETIRIZINE HCL 10 MG PO TABS: 10.0000 mg | ORAL_TABLET | Freq: Every day | ORAL | 0 refills | Status: DC

## 2020-04-06 MED ORDER — PREDNISONE 20 MG PO TABS: 20.0000 mg | ORAL_TABLET | Freq: Every day | ORAL | 0 refills | Status: AC

## 2020-04-06 MED ORDER — BENZONATATE 100 MG PO CAPS: 100.0000 mg | ORAL_CAPSULE | Freq: Three times a day (TID) | ORAL | 0 refills | Status: DC | PRN

## 2020-04-06 NOTE — ED Provider Notes (Signed)
[facility]    CSN: [id number] Arrival date & time: 04/06/20  0907      History   Chief Complaint Chief Complaint  Patient presents with  . Nasal Congestion    HPI [name] is a 35 y.o. female.   HPI Patient presents today for evaluation of persistent nasal congestion, persistent cough, throat soreness and hoarseness of voice x3 days.  Patient has no history of asthma.  No known sick contacts.  She is afebrile at present.  She has taken multiple over-the-counter medications without relief of symptoms. Denies shortness of breath or wheezing. Past Medical History:  Diagnosis Date  . Breast nodule 03/25/2014  . BV (bacterial vaginosis) 03/18/2014  . Hypoglycemia   . Vaginal discharge 03/18/2014  . Vaginal itching 03/18/2014    Patient Active Problem List   Diagnosis Date Noted  . Encounter for gynecological examination with Papanicolaou smear of cervix 08/22/2018  . Moody 08/22/2018  . Constipation 08/22/2018  . Weight gain 08/22/2018  . Dry hair 08/22/2018  . Hot flashes 08/22/2018  . Rash and nonspecific skin eruption   . Headache 05/29/2015  . Sepsis ([place]) 05/29/2015  . Nausea, vomiting and diarrhea 05/29/2015  . Pressure in head   . Vomiting and diarrhea   . Erythrocytosis 05/28/2015  . Back stiffness 05/28/2015  . Neck stiffness 05/28/2015  . Vision changes 05/28/2015  . Leukocytosis 05/28/2015  . Meningitis like reaction 05/27/2015  . Breast nodule 03/25/2014  . Vaginal discharge 03/18/2014  . Vaginal itching 03/18/2014  . BV (bacterial vaginosis) 03/18/2014  . Pelvic peritoneal adhesions, female 02/19/2013  . Status post hysterectomy 02/19/2013    Past Surgical History:  Procedure Laterality Date  . ABDOMINAL HYSTERECTOMY    . CESAREAN SECTION  2009, 2012   x2  . LAPAROSCOPIC APPENDECTOMY N/A 10/11/2016   Procedure: APPENDECTOMY LAPAROSCOPIC;  Surgeon: Virl Cagey, MD;  Location: [facility];  Service: General;  Laterality: N/A;  .  LYSIS OF ADHESION N/A 02/19/2013   Procedure: LYSIS OF ADHESION;  Surgeon: [name], MD;  Location: [facility];  Service: Gynecology;  Laterality: N/A;  . MASS EXCISION N/A 07/23/2012   Procedure: EXCISION OF RIGHT LOWER ABDOMINAL MASS, ENDOMETRIAL IMPLANT;  Surgeon: Scherry Ran, MD;  Location: [facility];  Service: General;  Laterality: N/A;  . SCAR REVISION N/A 02/19/2013   Procedure: EXCISION OF SCAR ENDOMETRIOSIS;  Surgeon: [name], MD;  Location: [facility];  Service: Gynecology;  Laterality: N/A;  . SUPRACERVICAL ABDOMINAL HYSTERECTOMY N/A 02/19/2013   Procedure: HYSTERECTOMY SUPRACERVICAL ABDOMINAL;  Surgeon: [name], MD;  Location: [facility];  Service: Gynecology;  Laterality: N/A;  . TONSILLECTOMY    . TUBAL LIGATION      OB History    Gravida  2   Para  2   Term  2   Preterm      AB      Living  2     SAB      IAB      Ectopic      Multiple      Live Births  2            Home Medications    Prior to Admission medications   Medication Sig Start Date End Date Taking? Authorizing Provider  ibuprofen (ADVIL,MOTRIN) 200 MG tablet Take 200-400 mg by mouth every 6 (six) hours as needed for mild pain.    [provider]  sertraline (ZOLOFT) 50 MG tablet Take  1 tablet by mouth once daily 04/01/20   Estill Dooms, NP    Family History Family History  Problem Relation Age of Onset  . Hypertension Father   . Diabetes Other   . Heart disease Other   . Colon cancer Other   . Heart disease Other   . Colon cancer Paternal Grandmother   . Diabetes Maternal Grandmother   . Heart disease Maternal Grandmother   . Heart disease Maternal Grandfather     Social History Social History   Tobacco Use  . Smoking status: Never Smoker  . Smokeless tobacco: Never Used  Vaping Use  . Vaping Use: Never used  Substance Use Topics  . Alcohol use: No  . Drug use: No     Allergies   Codeine   Review of Systems Review of  Systems Pertinent negatives listed in HPI  Physical Exam Triage Vital Signs ED Triage Vitals [04/06/20 0919]  Enc Vitals Group     BP 120/88     Pulse Rate 93     Resp 19     Temp 98 F (36.7 C)     Temp Source Oral     SpO2 98 %     Weight      Height      Head Circumference      Peak Flow      Pain Score 2     Pain Loc      Pain Edu?      Excl. in [place]?    No data found.  Updated Vital Signs BP 120/88 (BP Location: Right Arm)   Pulse 93   Temp 98 F (36.7 C) (Oral)   Resp 19   LMP 01/08/2013 Comment: [place]  SpO2 98%   Visual Acuity Right Eye Distance:   Left Eye Distance:   Bilateral Distance:    Right Eye Near:   Left Eye Near:    Bilateral Near:     Physical Exam  General Appearance:    Alert, cooperative, no distress  HENT:   Normocephalic, ears normal, nares mucosal edema with congestion, rhinorrhea, oropharynx without edema or erythema, hoarseness of voice  Eyes:    PERRL, conjunctiva/corneas clear, EOM's intact       Lungs:     Clear to auscultation bilaterally, respirations unlabored  Heart:    Regular rate and rhythm  Neurologic:   Awake, alert, oriented x 3. No apparent focal neurological           defect.    UC Treatments / Results  Labs (all labs ordered are listed, but only abnormal results are displayed) Labs Reviewed  COVID-19, FLU A+B NAA    EKG   Radiology No results found.  Procedures Procedures (including critical care time)  Medications Ordered in UC Medications - No data to display  Initial Impression / Assessment and Plan / UC Course  I have reviewed the triage vital signs and the nursing notes.  Pertinent labs & imaging results that were available during my care of the patient were reviewed by me and considered in my medical decision making (see chart for details).  Patient seen today with multiple symptoms consistent with that of a viral upper respiratory illness. Patient has no acute concern for COVID-19.  Symptomatic  management warranted only however given patient is having severe soreness with swallowing and hoarseness of voice will cover with a brief course of prednisone, Zyrtec and Flonase for nasal symptoms, and benzonatate for cough. PCP follow-up if  symptoms worsen or do not improve. Final Clinical Impressions(s) / UC Diagnoses   Final diagnoses:  Cough  Nasal congestion  Allergic rhinitis, unspecified seasonality, unspecified trigger  Hoarseness of voice   Discharge Instructions   None    ED Prescriptions    Medication Sig Dispense Auth. Provider   predniSONE (DELTASONE) 20 MG tablet Take 1 tablet (20 mg total) by mouth daily with breakfast for 5 days. 5 tablet Scot Jun, FNP   cetirizine (ZYRTEC) 10 MG tablet Take 1 tablet (10 mg total) by mouth daily. 30 tablet Scot Jun, FNP   benzonatate (TESSALON) 100 MG capsule Take 1-2 capsules (100-200 mg total) by mouth 3 (three) times daily as needed for cough. 40 capsule Scot Jun, FNP   fluticasone (FLONASE) 50 MCG/ACT nasal spray Place 2 sprays into both nostrils daily. 16 g Scot Jun, FNP     PDMP not reviewed this encounter.   Scot Jun, [place] 04/07/20 1752

## 2020-04-06 NOTE — ED Triage Notes (Signed)
Headache, cough, sinus congestion and lost voice x 3 days

## 2020-04-07 LAB — COVID-19, FLU A+B NAA
Influenza A, NAA: NOT DETECTED
Influenza B, NAA: NOT DETECTED
SARS-CoV-2, NAA: NOT DETECTED

## 2020-04-10 DIAGNOSIS — Z20822 Contact with and (suspected) exposure to covid-19: Secondary | ICD-10-CM | POA: Diagnosis not present

## 2020-04-20 ENCOUNTER — Ambulatory Visit: Payer: BC Managed Care – PPO | Admitting: Nurse Practitioner

## 2020-04-20 ENCOUNTER — Other Ambulatory Visit: Payer: Self-pay

## 2020-04-20 ENCOUNTER — Encounter: Payer: Self-pay | Admitting: Nurse Practitioner

## 2020-04-20 DIAGNOSIS — R4589 Other symptoms and signs involving emotional state: Secondary | ICD-10-CM

## 2020-04-20 DIAGNOSIS — Z7689 Persons encountering health services in other specified circumstances: Secondary | ICD-10-CM

## 2020-04-20 DIAGNOSIS — R232 Flushing: Secondary | ICD-10-CM | POA: Diagnosis not present

## 2020-04-20 DIAGNOSIS — Z90710 Acquired absence of both cervix and uterus: Secondary | ICD-10-CM | POA: Diagnosis not present

## 2020-04-20 DIAGNOSIS — R635 Abnormal weight gain: Secondary | ICD-10-CM

## 2020-04-20 DIAGNOSIS — N63 Unspecified lump in unspecified breast: Secondary | ICD-10-CM

## 2020-04-20 DIAGNOSIS — Z139 Encounter for screening, unspecified: Secondary | ICD-10-CM | POA: Insufficient documentation

## 2020-04-20 NOTE — Assessment & Plan Note (Signed)
-  BMI 45.3 -may consider meds at next visit

## 2020-04-20 NOTE — Assessment & Plan Note (Addendum)
-  will get records from [place] -no recent labs or physical -followed by [facility] for GYN

## 2020-04-20 NOTE — Progress Notes (Signed)
New Patient Office Visit  Subjective:  Patient ID: [name], female    DOB: March 22, 1985  Age: 34 y.o. MRN: [id number]  CC:  Chief Complaint  Patient presents with  . New Patient (Initial Visit)    HPI [name] presents for new patient visit. Transferring care from [facility] last visit was over 2 years ago. Last physical and labs were drawn over 2 years ago.  PGM has colon cancer at 65 She also states that her mother's family has a lot of hypothyroidism.  She states she has dry skin, dry hair, hair loss, weight gain, lack of energy.  Past Medical History:  Diagnosis Date  . Breast nodule 03/25/2014  . BV (bacterial vaginosis) 03/18/2014  . Hypoglycemia   . Vaginal discharge 03/18/2014  . Vaginal itching 03/18/2014    Past Surgical History:  Procedure Laterality Date  . ABDOMINAL HYSTERECTOMY     partial- 1 ovary and cervix are intact  . APPENDECTOMY    . CESAREAN SECTION  2009, 2012   x2  . LAPAROSCOPIC APPENDECTOMY N/A 10/11/2016   Procedure: APPENDECTOMY LAPAROSCOPIC;  Surgeon: Virl Cagey, MD;  Location: [facility];  Service: General;  Laterality: N/A;  . LYSIS OF ADHESION N/A 02/19/2013   Procedure: LYSIS OF ADHESION;  Surgeon: [name], MD;  Location: [facility];  Service: Gynecology;  Laterality: N/A;  . MASS EXCISION N/A 07/23/2012   Procedure: EXCISION OF RIGHT LOWER ABDOMINAL MASS, ENDOMETRIAL IMPLANT;  Surgeon: Scherry Ran, MD;  Location: [facility];  Service: General;  Laterality: N/A;  . SCAR REVISION N/A 02/19/2013   Procedure: EXCISION OF SCAR ENDOMETRIOSIS;  Surgeon: [name], MD;  Location: [facility];  Service: Gynecology;  Laterality: N/A;  . SUPRACERVICAL ABDOMINAL HYSTERECTOMY N/A 02/19/2013   Procedure: HYSTERECTOMY SUPRACERVICAL ABDOMINAL;  Surgeon: [name], MD;  Location: [facility];  Service: Gynecology;  Laterality: N/A;  . TONSILLECTOMY    . TUBAL LIGATION      Family History  Problem Relation Age of Onset  . Hypertension  Father   . Diabetes Other   . Heart disease Other   . Colon cancer Other   . Heart disease Other   . Colon cancer Paternal Grandmother   . Diabetes Maternal Grandmother   . Heart disease Maternal Grandmother   . Heart disease Maternal Grandfather     Social History   Socioeconomic History  . Marital status: Married    Spouse name: Not on file  . Number of children: 2  . Years of education: Not on file  . Highest education level: Not on file  Occupational History  . Occupation: Marketing executive  Tobacco Use  . Smoking status: Never Smoker  . Smokeless tobacco: Never Used  Vaping Use  . Vaping Use: Never used  Substance and Sexual Activity  . Alcohol use: Yes    Comment: occasional  . Drug use: No  . Sexual activity: Yes    Birth control/protection: Surgical    Comment: hyst  Other Topics Concern  . Not on file  Social History Narrative   2 sons   Social Determinants of Health   Financial Resource Strain: Not on file  Food Insecurity: Not on file  Transportation Needs: Not on file  Physical Activity: Not on file  Stress: Not on file  Social Connections: Not on file  Intimate Partner Violence: Not on file    ROS Review of Systems  Objective:   Today's Vitals: BP 130/84  Pulse 75   Temp 98.3 F (36.8 C)   Resp 20   Ht 5' (1.524 m)   Wt 232 lb (105.2 kg)   LMP 01/08/2013 Comment: [place]  SpO2 97%   BMI 45.31 kg/m   Physical Exam  Assessment & Plan:   Problem List Items Addressed This Visit      Cardiovascular and Mediastinum   Hot flashes     Other   Status post hysterectomy   Breast nodule   Encounter to establish care    -will get records from [facility] -no recent labs or physical -followed by [facility] for [facility]      Relevant Orders   CBC with Differential/Platelet   CMP14+EGFR   HCV Ab w/Rflx to Verification   HIV Antibody (routine testing w rflx)   Lipid Panel With LDL/HDL Ratio   TSH + free T4   Moody    -takes sertraline and this is  doing well      Weight gain    -will check thyroid along with routine labs -may consider meds at next visit      Relevant Orders   TSH + free T4   Morbid obesity ([place])    -BMI 45.3 -may consider meds at next visit      Screening due    -HIV and HCV screening with next set of labs      Relevant Orders   HCV Ab w/Rflx to Verification   HIV Antibody (routine testing w rflx)      Outpatient Encounter Medications as of 04/20/2020  Medication Sig  . cetirizine (ZYRTEC) 10 MG tablet Take 1 tablet (10 mg total) by mouth daily.  . fluticasone (FLONASE) 50 MCG/ACT nasal spray Place 2 sprays into both nostrils daily.  Marland Kitchen ibuprofen (ADVIL,MOTRIN) 200 MG tablet Take 200-400 mg by mouth every 6 (six) hours as needed for mild pain.  Marland Kitchen sertraline (ZOLOFT) 50 MG tablet Take 1 tablet by mouth once daily  . [DISCONTINUED] benzonatate (TESSALON) 100 MG capsule Take 1-2 capsules (100-200 mg total) by mouth 3 (three) times daily as needed for cough. (Patient not taking: Reported on 04/20/2020)   No facility-administered encounter medications on file as of 04/20/2020.    Follow-up: Return in about 2 weeks (around 05/04/2020) for Physical Exam (No PAP).   Noreene Larsson, NP

## 2020-04-20 NOTE — Assessment & Plan Note (Signed)
-  takes sertraline and this is doing well

## 2020-04-20 NOTE — Assessment & Plan Note (Signed)
-  HIV and HCV screening with next set of labs

## 2020-04-20 NOTE — Assessment & Plan Note (Signed)
-  will check thyroid along with routine labs -may consider meds at next visit

## 2020-04-20 NOTE — Patient Instructions (Signed)
Please have fasting labs drawn 2-3 days prior to your appointment so we can discuss the results during your office visit.  If you are fasting today, we can draw the labs today.

## 2020-05-01 DIAGNOSIS — R635 Abnormal weight gain: Secondary | ICD-10-CM | POA: Diagnosis not present

## 2020-05-01 DIAGNOSIS — Z139 Encounter for screening, unspecified: Secondary | ICD-10-CM | POA: Diagnosis not present

## 2020-05-01 DIAGNOSIS — Z7689 Persons encountering health services in other specified circumstances: Secondary | ICD-10-CM | POA: Diagnosis not present

## 2020-05-01 DIAGNOSIS — D751 Secondary polycythemia: Secondary | ICD-10-CM | POA: Diagnosis not present

## 2020-05-01 DIAGNOSIS — E785 Hyperlipidemia, unspecified: Secondary | ICD-10-CM | POA: Diagnosis not present

## 2020-05-01 DIAGNOSIS — R7301 Impaired fasting glucose: Secondary | ICD-10-CM | POA: Diagnosis not present

## 2020-05-02 LAB — CMP14+EGFR
ALT: 12 IU/L (ref 0–32)
AST: 12 IU/L (ref 0–40)
Albumin/Globulin Ratio: 1.7 (ref 1.2–2.2)
Albumin: 4.3 g/dL (ref 3.8–4.8)
Alkaline Phosphatase: 85 IU/L (ref 44–121)
BUN/Creatinine Ratio: 13 (ref 9–23)
BUN: 12 mg/dL (ref 6–20)
Bilirubin Total: 0.4 mg/dL (ref 0.0–1.2)
CO2: 23 mmol/L (ref 20–29)
Calcium: 9.5 mg/dL (ref 8.7–10.2)
Chloride: 102 mmol/L (ref 96–106)
Creatinine, Ser: 0.92 mg/dL (ref 0.57–1.00)
Globulin, Total: 2.5 g/dL (ref 1.5–4.5)
Glucose: 114 mg/dL — ABNORMAL HIGH (ref 65–99)
Potassium: 4.6 mmol/L (ref 3.5–5.2)
Sodium: 140 mmol/L (ref 134–144)
Total Protein: 6.8 g/dL (ref 6.0–8.5)
eGFR: 85 mL/min/{1.73_m2} (ref >59)

## 2020-05-02 LAB — CBC WITH DIFFERENTIAL/PLATELET
Basophils Absolute: 0 10*3/uL (ref 0.0–0.2)
Basos: 0 %
EOS (ABSOLUTE): 0.1 10*3/uL (ref 0.0–0.4)
Eos: 1 %
Hematocrit: 42.9 % (ref 34.0–46.6)
Hemoglobin: 14.3 g/dL (ref 11.1–15.9)
Immature Grans (Abs): 0 10*3/uL (ref 0.0–0.1)
Immature Granulocytes: 0 %
Lymphocytes Absolute: 1.8 10*3/uL (ref 0.7–3.1)
Lymphs: 24 %
MCH: 28.3 pg (ref 26.6–33.0)
MCHC: 33.3 g/dL (ref 31.5–35.7)
MCV: 85 fL (ref 79–97)
Monocytes Absolute: 0.6 10*3/uL (ref 0.1–0.9)
Monocytes: 8 %
Neutrophils Absolute: 5.1 10*3/uL (ref 1.4–7.0)
Neutrophils: 67 %
Platelets: 292 10*3/uL (ref 150–450)
RBC: 5.06 x10E6/uL (ref 3.77–5.28)
RDW: 13.3 % (ref 11.7–15.4)
WBC: 7.6 10*3/uL (ref 3.4–10.8)

## 2020-05-02 LAB — LIPID PANEL WITH LDL/HDL RATIO
Cholesterol, Total: 187 mg/dL (ref 100–199)
HDL: 45 mg/dL (ref >39)
LDL Chol Calc (NIH): 118 mg/dL — ABNORMAL HIGH (ref 0–99)
LDL/HDL Ratio: 2.6 ratio (ref 0.0–3.2)
Triglycerides: 137 mg/dL (ref 0–149)
VLDL Cholesterol Cal: 24 mg/dL (ref 5–40)

## 2020-05-02 LAB — TSH+FREE T4
Free T4: 1.18 ng/dL (ref 0.82–1.77)
TSH: 2.08 u[IU]/mL (ref 0.450–4.500)

## 2020-05-02 LAB — HCV INTERPRETATION

## 2020-05-02 LAB — HCV AB W/RFLX TO VERIFICATION: HCV Ab: <0.1 s/co ratio (ref 0.0–0.9)

## 2020-05-02 LAB — HIV ANTIBODY (ROUTINE TESTING W REFLEX): HIV Screen 4th Generation wRfx: NONREACTIVE

## 2020-05-04 ENCOUNTER — Ambulatory Visit (INDEPENDENT_AMBULATORY_CARE_PROVIDER_SITE_OTHER): Payer: BC Managed Care – PPO | Admitting: Nurse Practitioner

## 2020-05-04 ENCOUNTER — Encounter: Payer: Self-pay | Admitting: Nurse Practitioner

## 2020-05-04 ENCOUNTER — Other Ambulatory Visit: Payer: Self-pay

## 2020-05-04 VITALS — BP 127/88 | HR 80 | Temp 98.4°F | Resp 20 | Ht 60.0 in | Wt 229.0 lb

## 2020-05-04 DIAGNOSIS — R7301 Impaired fasting glucose: Secondary | ICD-10-CM | POA: Insufficient documentation

## 2020-05-04 DIAGNOSIS — R5383 Other fatigue: Secondary | ICD-10-CM | POA: Diagnosis not present

## 2020-05-04 DIAGNOSIS — E785 Hyperlipidemia, unspecified: Secondary | ICD-10-CM

## 2020-05-04 NOTE — Progress Notes (Signed)
I'll discuss these with her today. No need to call

## 2020-05-04 NOTE — Assessment & Plan Note (Signed)
-  fasting glucose was 114 -A1c added to labs

## 2020-05-04 NOTE — Progress Notes (Signed)
Established Patient Office Visit  Subjective:  Patient ID: [name], female    DOB: 04-Aug-1985  Age: 35 y.o. MRN: [id number]  CC:  Chief Complaint  Patient presents with  . Annual Exam    Go over labs     HPI [name] presents for physical exam.  She states that she has had fatigue for several months now; she falls asleep if she isn't doing anything active; hasn't fallen asleep at inappropriate times yet, but falls asleep easily and always feels tired. She had some hair loss, and TSH was normal previously an dis normal today.  Past Medical History:  Diagnosis Date  . Breast nodule 03/25/2014  . BV (bacterial vaginosis) 03/18/2014  . Hypoglycemia   . Vaginal discharge 03/18/2014  . Vaginal itching 03/18/2014    Past Surgical History:  Procedure Laterality Date  . ABDOMINAL HYSTERECTOMY     partial- 1 ovary and cervix are intact  . APPENDECTOMY    . CESAREAN SECTION  2009, 2012   x2  . LAPAROSCOPIC APPENDECTOMY N/A 10/11/2016   Procedure: APPENDECTOMY LAPAROSCOPIC;  Surgeon: Virl Cagey, MD;  Location: [facility];  Service: General;  Laterality: N/A;  . LYSIS OF ADHESION N/A 02/19/2013   Procedure: LYSIS OF ADHESION;  Surgeon: [name], MD;  Location: [facility];  Service: Gynecology;  Laterality: N/A;  . MASS EXCISION N/A 07/23/2012   Procedure: EXCISION OF RIGHT LOWER ABDOMINAL MASS, ENDOMETRIAL IMPLANT;  Surgeon: Scherry Ran, MD;  Location: [facility];  Service: General;  Laterality: N/A;  . SCAR REVISION N/A 02/19/2013   Procedure: EXCISION OF SCAR ENDOMETRIOSIS;  Surgeon: [name], MD;  Location: [facility];  Service: Gynecology;  Laterality: N/A;  . SUPRACERVICAL ABDOMINAL HYSTERECTOMY N/A 02/19/2013   Procedure: HYSTERECTOMY SUPRACERVICAL ABDOMINAL;  Surgeon: [name], MD;  Location: [facility];  Service: Gynecology;  Laterality: N/A;  . TONSILLECTOMY    . TUBAL LIGATION      Family History  Problem Relation Age of Onset  . Hypertension Father    . Diabetes Other   . Heart disease Other   . Colon cancer Other   . Heart disease Other   . Colon cancer Paternal Grandmother   . Diabetes Maternal Grandmother   . Heart disease Maternal Grandmother   . Heart disease Maternal Grandfather     Social History   Socioeconomic History  . Marital status: Married    Spouse name: Not on file  . Number of children: 2  . Years of education: Not on file  . Highest education level: Not on file  Occupational History  . Occupation: Marketing executive  Tobacco Use  . Smoking status: Never Smoker  . Smokeless tobacco: Never Used  Vaping Use  . Vaping Use: Never used  Substance and Sexual Activity  . Alcohol use: Yes    Comment: occasional  . Drug use: No  . Sexual activity: Yes    Birth control/protection: Surgical    Comment: hyst  Other Topics Concern  . Not on file  Social History Narrative   2 sons   Social Determinants of Health   Financial Resource Strain: Not on file  Food Insecurity: Not on file  Transportation Needs: Not on file  Physical Activity: Not on file  Stress: Not on file  Social Connections: Not on file  Intimate Partner Violence: Not on file    Outpatient Medications Prior to Visit  Medication Sig Dispense Refill  . cetirizine (ZYRTEC) 10  MG tablet Take 1 tablet (10 mg total) by mouth daily. 30 tablet 0  . fluticasone (FLONASE) 50 MCG/ACT nasal spray Place 2 sprays into both nostrils daily. 16 g 0  . ibuprofen (ADVIL,MOTRIN) 200 MG tablet Take 200-400 mg by mouth every 6 (six) hours as needed for mild pain.    Marland Kitchen sertraline (ZOLOFT) 50 MG tablet Take 1 tablet by mouth once daily 30 tablet 3   No facility-administered medications prior to visit.    Allergies  Allergen Reactions  . Codeine Hives and Itching    ROS Review of Systems  Constitutional: Positive for fatigue. Negative for appetite change, chills and fever.  HENT: Negative.   Eyes: Negative.   Respiratory: Negative.   Cardiovascular: Negative.    Gastrointestinal: Negative.   Endocrine: Negative.   Genitourinary: Negative.   Musculoskeletal: Negative.   Skin: Negative.   Allergic/Immunologic: Negative.   Neurological: Negative.   Hematological: Negative.   Psychiatric/Behavioral: Negative.       Objective:    Physical Exam Constitutional:      Appearance: Normal appearance. She is obese.  HENT:     Head: Normocephalic and atraumatic.     Right Ear: Tympanic membrane, ear canal and external ear normal.     Left Ear: Tympanic membrane, ear canal and external ear normal.     Nose: Nose normal.     Mouth/Throat:     Mouth: Mucous membranes are moist.     Pharynx: Oropharynx is clear.  Eyes:     Extraocular Movements: Extraocular movements intact.     Conjunctiva/sclera: Conjunctivae normal.     Pupils: Pupils are equal, round, and reactive to light.  Cardiovascular:     Rate and Rhythm: Normal rate and regular rhythm.     Pulses: Normal pulses.     Heart sounds: Normal heart sounds.  Pulmonary:     Effort: Pulmonary effort is normal.     Breath sounds: Normal breath sounds.  Abdominal:     General: Abdomen is flat. Bowel sounds are normal.  Musculoskeletal:        General: Normal range of motion.     Cervical back: Normal range of motion and neck supple.  Skin:    General: Skin is warm and dry.     Capillary Refill: Capillary refill takes less than 2 seconds.  Neurological:     General: No focal deficit present.     Mental Status: She is alert and oriented to person, place, and time.  Psychiatric:        Mood and Affect: Mood normal.        Behavior: Behavior normal.        Thought Content: Thought content normal.        Judgment: Judgment normal.     BP 127/88   Pulse 80   Temp 98.4 F (36.9 C)   Resp 20   Ht 5' (1.524 m)   Wt 229 lb (103.9 kg)   LMP 01/08/2013 Comment: [place]  SpO2 94%   BMI 44.72 kg/m  Wt Readings from Last 3 Encounters:  05/04/20 229 lb (103.9 kg)  04/20/20 232 lb (105.2 kg)   12/20/19 230 lb (104.3 kg)     Health Maintenance Due  Topic Date Due  . COVID-19 Vaccine (3 - Pfizer risk 4-dose series) 04/01/2019    There are no preventive care reminders to display for this patient.  Lab Results  Component Value Date   TSH 2.080 05/01/2020   Lab Results  Component Value Date   WBC 7.6 05/01/2020   HGB 14.3 05/01/2020   HCT 42.9 05/01/2020   MCV 85 05/01/2020   PLT 292 05/01/2020   Lab Results  Component Value Date   NA 140 05/01/2020   K 4.6 05/01/2020   CO2 23 05/01/2020   GLUCOSE 114 (H) 05/01/2020   BUN 12 05/01/2020   CREATININE 0.92 05/01/2020   BILITOT 0.4 05/01/2020   ALKPHOS 85 05/01/2020   AST 12 05/01/2020   ALT 12 05/01/2020   PROT 6.8 05/01/2020   ALBUMIN 4.3 05/01/2020   CALCIUM 9.5 05/01/2020   ANIONGAP 8 10/11/2016   Lab Results  Component Value Date   CHOL 187 05/01/2020   Lab Results  Component Value Date   HDL 45 05/01/2020   Lab Results  Component Value Date   LDLCALC 118 (H) 05/01/2020   Lab Results  Component Value Date   TRIG 137 05/01/2020   No results found for: CHOLHDL Lab Results  Component Value Date   HGBA1C 5.4 08/22/2018      Assessment & Plan:   Problem List Items Addressed This Visit      Endocrine   Impaired fasting blood sugar    -fasting glucose was 114 -A1c added to labs        Other   Hyperlipidemia    Lab Results  Component Value Date   CHOL 187 05/01/2020   HDL 45 05/01/2020   LDLCALC 118 (H) 05/01/2020   TRIG 137 05/01/2020  -reduce fried/fatty foods       Fatigue - Primary    -she states that she has had fatigue for several months now; she falls asleep if she isn't doing anything active; hasn't fallen asleep at inappropriate times yet, but falls asleep easily and always feels tired -thyroid labs were unremarkable -she states she snores at night -will get home sleep study to r/o OSA -may need future referral to endo or neuro if negative for sleep apnea       Relevant Orders   Home sleep test      No orders of the defined types were placed in this encounter.   Follow-up: Return in about 1 year (around 05/04/2021) for physical exam.    Noreene Larsson, NP

## 2020-05-04 NOTE — Assessment & Plan Note (Addendum)
-  she states that she has had fatigue for several months now; she falls asleep if she isn't doing anything active; hasn't fallen asleep at inappropriate times yet, but falls asleep easily and always feels tired -thyroid labs were unremarkable -she states she snores at night -will get home sleep study to r/o OSA -may need future referral to endo or neuro if negative for sleep apnea

## 2020-05-04 NOTE — Assessment & Plan Note (Signed)
Lab Results  Component Value Date   CHOL 187 05/01/2020   HDL 45 05/01/2020   LDLCALC 118 (H) 05/01/2020   TRIG 137 05/01/2020  -reduce fried/fatty foods

## 2020-05-06 LAB — SPECIMEN STATUS REPORT

## 2020-05-06 LAB — HGB A1C W/O EAG: Hgb A1c MFr Bld: 6 % — ABNORMAL HIGH (ref 4.8–5.6)

## 2020-05-06 NOTE — Progress Notes (Signed)
We added an A1c and it came back in the prediabetic range. No need to add medications, but diet changes and increasing aerobic exercises can help lower that.

## 2020-06-06 ENCOUNTER — Encounter: Payer: Self-pay | Admitting: Nurse Practitioner

## 2020-06-11 ENCOUNTER — Ambulatory Visit: Payer: BC Managed Care – PPO | Admitting: Nurse Practitioner

## 2020-06-18 ENCOUNTER — Ambulatory Visit: Payer: BC Managed Care – PPO | Admitting: Nurse Practitioner

## 2020-09-09 ENCOUNTER — Emergency Department (HOSPITAL_COMMUNITY): Payer: Managed Care, Other (non HMO)

## 2020-09-09 ENCOUNTER — Emergency Department (HOSPITAL_COMMUNITY)
Admission: EM | Admit: 2020-09-09 | Discharge: 2020-09-10 | Disposition: A | Discharge status: Home / Self Care | Payer: Managed Care, Other (non HMO) | Attending: Emergency Medicine | Admitting: Emergency Medicine

## 2020-09-09 ENCOUNTER — Other Ambulatory Visit: Payer: Self-pay

## 2020-09-09 DIAGNOSIS — R079 Chest pain, unspecified: Secondary | ICD-10-CM

## 2020-09-09 DIAGNOSIS — R202 Paresthesia of skin: Secondary | ICD-10-CM | POA: Insufficient documentation

## 2020-09-09 DIAGNOSIS — R072 Precordial pain: Secondary | ICD-10-CM | POA: Diagnosis not present

## 2020-09-09 NOTE — ED Triage Notes (Signed)
Pov from home with cc of chest pain for around 2 hours pta. States she was eating dinner when she felt pressure int he center of her chest. Now it is going down her left arm with discomfort and says her hand is cold. Also feels it in her back. Thought it was just gas at first but it never went away only got worse.  Denies sob or lightheadedness.

## 2020-09-10 ENCOUNTER — Emergency Department (HOSPITAL_COMMUNITY): Payer: Managed Care, Other (non HMO)

## 2020-09-10 LAB — BASIC METABOLIC PANEL
Anion gap: 8 (ref 5–15)
BUN: 12 mg/dL (ref 6–20)
CO2: 24 mmol/L (ref 22–32)
Calcium: 9.1 mg/dL (ref 8.9–10.3)
Chloride: 105 mmol/L (ref 98–111)
Creatinine, Ser: 0.99 mg/dL (ref 0.44–1.00)
GFR, Estimated: >60 mL/min (ref >60)
Glucose, Bld: 127 mg/dL — ABNORMAL HIGH (ref 70–99)
Potassium: 3.4 mmol/L — ABNORMAL LOW (ref 3.5–5.1)
Sodium: 137 mmol/L (ref 135–145)

## 2020-09-10 LAB — CBC
HCT: 41 % (ref 36.0–46.0)
Hemoglobin: 13.8 g/dL (ref 12.0–15.0)
MCH: 28.6 pg (ref 26.0–34.0)
MCHC: 33.7 g/dL (ref 30.0–36.0)
MCV: 85.1 fL (ref 80.0–100.0)
Platelets: 282 10*3/uL (ref 150–400)
RBC: 4.82 MIL/uL (ref 3.87–5.11)
RDW: 12.9 % (ref 11.5–15.5)
WBC: 9.3 10*3/uL (ref 4.0–10.5)
nRBC: 0 % (ref 0.0–0.2)

## 2020-09-10 LAB — TROPONIN I (HIGH SENSITIVITY)
Troponin I (High Sensitivity): <2 ng/L (ref <18)
Troponin I (High Sensitivity): <2 ng/L (ref <18)

## 2020-09-10 MED ORDER — IOHEXOL 350 MG/ML SOLN
100.0000 mL | Freq: Once | INTRAVENOUS | Status: AC | PRN
  Administered 2020-09-10: 100 mL via INTRAVENOUS

## 2020-09-10 NOTE — ED Provider Notes (Signed)
[facility] Provider Note   CSN: [id number] Arrival date & time: 09/09/20  2257     History Chief Complaint  Patient presents with   Chest Pain    [name] is a 35 y.o. female.   Chest Pain Pain location:  Substernal area, L chest and L lateral chest Pain quality: aching   Pain radiates to:  Does not radiate Pain severity:  Mild Onset quality:  Gradual Timing:  Constant Chronicity:  New Context: not breathing   Relieved by:  None tried Worsened by:  Nothing Ineffective treatments:  None tried Associated symptoms: no abdominal pain, no dizziness, no fatigue, no fever, no orthopnea and no palpitations       Past Medical History:  Diagnosis Date   Breast nodule 03/25/2014   BV (bacterial vaginosis) 03/18/2014   Hypoglycemia    Vaginal discharge 03/18/2014   Vaginal itching 03/18/2014    Patient Active Problem List   Diagnosis Date Noted   Impaired fasting blood sugar 05/04/2020   Hyperlipidemia 05/04/2020   Fatigue 05/04/2020   Morbid obesity ([place]) 04/20/2020   Screening due 04/20/2020   Encounter to establish care 08/22/2018   [facility] 08/22/2018   Weight gain 08/22/2018   Dry hair 08/22/2018   Hot flashes 08/22/2018   Headache 05/29/2015   Erythrocytosis 05/28/2015   Vision changes 05/28/2015   Breast nodule 03/25/2014   Vaginal discharge 03/18/2014   Pelvic peritoneal adhesions, female 02/19/2013   Status post hysterectomy 02/19/2013    Past Surgical History:  Procedure Laterality Date   ABDOMINAL HYSTERECTOMY     partial- 1 ovary and cervix are intact   APPENDECTOMY     CESAREAN SECTION  2009, 2012   x2   LAPAROSCOPIC APPENDECTOMY N/A 10/11/2016   Procedure: APPENDECTOMY LAPAROSCOPIC;  Surgeon: Virl Cagey, MD;  Location: [facility];  Service: General;  Laterality: N/A;   LYSIS OF ADHESION N/A 02/19/2013   Procedure: LYSIS OF ADHESION;  Surgeon: [name], MD;  Location: [facility];  Service: Gynecology;  Laterality:  N/A;   MASS EXCISION N/A 07/23/2012   Procedure: EXCISION OF RIGHT LOWER ABDOMINAL MASS, ENDOMETRIAL IMPLANT;  Surgeon: Scherry Ran, MD;  Location: [facility];  Service: General;  Laterality: N/A;   SCAR REVISION N/A 02/19/2013   Procedure: EXCISION OF SCAR ENDOMETRIOSIS;  Surgeon: [name], MD;  Location: [facility];  Service: Gynecology;  Laterality: N/A;   SUPRACERVICAL ABDOMINAL HYSTERECTOMY N/A 02/19/2013   Procedure: HYSTERECTOMY SUPRACERVICAL ABDOMINAL;  Surgeon: [name], MD;  Location: [facility];  Service: Gynecology;  Laterality: N/A;   TONSILLECTOMY     TUBAL LIGATION       OB History     Gravida  2   Para  2   Term  2   Preterm      AB      Living  2      SAB      IAB      Ectopic      Multiple      Live Births  2           Family History  Problem Relation Age of Onset   Hypertension Father    Diabetes Other    Heart disease Other    Colon cancer Other    Heart disease Other    Colon cancer Paternal Grandmother    Diabetes Maternal Grandmother    Heart disease Maternal Grandmother    Heart disease Maternal Grandfather  Social History   Tobacco Use   Smoking status: Never   Smokeless tobacco: Never  Vaping Use   Vaping Use: Never used  Substance Use Topics   Alcohol use: Yes    Comment: occasional   Drug use: No    Home Medications Prior to Admission medications   Medication Sig Start Date End Date Taking? Authorizing Provider  cetirizine (ZYRTEC) 10 MG tablet Take 1 tablet (10 mg total) by mouth daily. 04/06/20   Scot Jun, FNP  fluticasone (FLONASE) 50 MCG/ACT nasal spray Place 2 sprays into both nostrils daily. 04/06/20   Scot Jun, FNP  ibuprofen (ADVIL,MOTRIN) 200 MG tablet Take 200-400 mg by mouth every 6 (six) hours as needed for mild pain.    [provider]  sertraline (ZOLOFT) 50 MG tablet Take 1 tablet by mouth once daily 04/01/20   Estill Dooms, NP    Allergies     Codeine  Review of Systems   Review of Systems  Constitutional:  Negative for fatigue and fever.  Cardiovascular:  Positive for chest pain. Negative for palpitations and orthopnea.  Gastrointestinal:  Negative for abdominal pain.  Neurological:  Negative for dizziness.  All other systems reviewed and are negative.  Physical Exam Updated Vital Signs BP 113/71   Pulse 79   Temp 98.1 F (36.7 C) (Oral)   Resp 18   Ht 5' (1.524 m)   Wt 106.6 kg   LMP 01/08/2013 Comment: [place]  SpO2 100%   BMI 45.90 kg/m   Physical Exam Vitals and nursing note reviewed.  Constitutional:      Appearance: She is well-developed.  HENT:     Head: Normocephalic and atraumatic.  Eyes:     Pupils: Pupils are equal, round, and reactive to light.  Cardiovascular:     Rate and Rhythm: Normal rate and regular rhythm.     Comments: When she looks to the left she has  paresthesias in her left arm. Pulse intact.  Pulmonary:     Effort: No respiratory distress.     Breath sounds: No stridor.  Chest:     Chest wall: No mass, deformity or tenderness.  Abdominal:     General: There is no distension.     Palpations: Abdomen is soft.  Musculoskeletal:        General: Normal range of motion.     Cervical back: Normal range of motion.  Skin:    General: Skin is warm and dry.  Neurological:     Mental Status: She is alert.    ED Results / Procedures / Treatments   Labs (all labs ordered are listed, but only abnormal results are displayed) Labs Reviewed  BASIC METABOLIC PANEL - Abnormal; Notable for the following components:      Result Value   Potassium 3.4 (*)    Glucose, Bld 127 (*)    All other components within normal limits  CBC  TROPONIN I (HIGH SENSITIVITY)  TROPONIN I (HIGH SENSITIVITY)    EKG EKG Interpretation  Date/Time:  Wednesday September 09 2020 23:14:39 EDT Ventricular Rate:  82 PR Interval:  140 QRS Duration: 78 QT Interval:  370 QTC Calculation: 432 R Axis:   29 Text  Interpretation: Normal sinus rhythm Anterior infarct , age undetermined Abnormal ECG Confirmed by Merrily Pew [id number]) on 09/10/2020 4:43:42 AM  Radiology DG Chest 2 View  Result Date: 09/09/2020 CLINICAL DATA:  Chest pain. EXAM: CHEST - 2 VIEW COMPARISON:  Chest radiograph dated 05/29/2015. FINDINGS:  The heart size and mediastinal contours are within normal limits. Both lungs are clear. The visualized skeletal structures are unremarkable. IMPRESSION: No active cardiopulmonary disease. Electronically Signed   By: Anner Crete M.D.   On: 09/09/2020 23:30    Procedures Procedures   Medications Ordered in ED Medications  iohexol (OMNIPAQUE) 350 MG/ML injection 100 mL (100 mLs Intravenous Contrast Given 09/10/20 [id number])    ED Course  I have reviewed the triage vital signs and the nursing notes.  Pertinent labs & imaging results that were available during my care of the patient were reviewed by me and considered in my medical decision making (see chart for details).    MDM Rules/Calculators/A&P                         Suspect thoracic outlet syndrome. D/w Radiologist, will plan for CT for same.   CT angio without any evidence of thoracic outlet syndrome, if MRI negative may need outpatient follow-up with vascular for the same.  We will MRI to make sure there is no neurologic or compressive issue when she turns her head to the left.  If this is negative patient should be stable for discharge.  His further chest pain goes I think it is unlikely to be ACS, PE.  No evidence of dissection on CT scan.  It sounds to GI as it did start when she is eating could just been esophageal spasm. Care transferred pending MRI.  Final Clinical Impression(s) / ED Diagnoses Final diagnoses:  None    Rx / DC Orders ED Discharge Orders     None        Deshanta Lady, Corene Cornea, MD 09/10/20 [phone]

## 2020-09-10 NOTE — Discharge Instructions (Addendum)
Your CT scan and MRI of the neck did not show any signs of acute injury.  Continue to follow-up with your doctors as needed within the week.  Return to the ER if your symptoms worsen you have new numbness weakness or she have any additional concerns.

## 2020-09-10 NOTE — ED Notes (Signed)
Pt returned from MRI °

## 2020-09-14 ENCOUNTER — Ambulatory Visit: Payer: BC Managed Care – PPO | Admitting: Adult Health

## 2020-09-23 ENCOUNTER — Other Ambulatory Visit: Payer: Self-pay | Admitting: Adult Health

## 2020-09-23 MED ORDER — SERTRALINE HCL 50 MG PO TABS: 50.0000 mg | ORAL_TABLET | Freq: Every day | ORAL | 3 refills | Status: DC

## 2020-09-23 NOTE — Progress Notes (Signed)
Refilled zoloft

## 2020-09-30 ENCOUNTER — Ambulatory Visit: Payer: Managed Care, Other (non HMO) | Admitting: Adult Health

## 2020-10-06 ENCOUNTER — Encounter: Payer: Self-pay | Admitting: Nurse Practitioner

## 2020-10-06 ENCOUNTER — Other Ambulatory Visit: Payer: Self-pay

## 2020-10-06 ENCOUNTER — Ambulatory Visit (INDEPENDENT_AMBULATORY_CARE_PROVIDER_SITE_OTHER): Payer: Managed Care, Other (non HMO) | Admitting: Nurse Practitioner

## 2020-10-06 VITALS — BP 122/83 | HR 72 | Temp 98.3°F | Ht 61.0 in | Wt 231.0 lb

## 2020-10-06 DIAGNOSIS — F419 Anxiety disorder, unspecified: Secondary | ICD-10-CM | POA: Insufficient documentation

## 2020-10-06 DIAGNOSIS — R5383 Other fatigue: Secondary | ICD-10-CM | POA: Diagnosis not present

## 2020-10-06 NOTE — Progress Notes (Signed)
Acute Office Visit  Subjective:    Patient ID: [name], female    DOB: 01-14-1986, 35 y.o.   MRN: [id number]  Chief Complaint  Patient presents with   Anxiety    Ongoing since hysterectomy around 8 years ago, has worsened over the past 4 months.    Fatigue    Ongoing for awhile, worsening over the past 4 months. Was supposed to get a sleep study done and never heard anything.     Anxiety Symptoms include nervous/anxious behavior. Patient reports no suicidal ideas.    Patient is in today for anxiety and fatigue.  She had sleep study ordered, and this hasn't been set up yet.  She was taking sertraline previously. She stopped d/t insurance lapse, and she couldn't get an appointment to get a refill. Her GYN sent in a refill of zoloft at the end of august, but today she states that it may take off the edge.  Past Medical History:  Diagnosis Date   Breast nodule 03/25/2014   BV (bacterial vaginosis) 03/18/2014   Hypoglycemia    Vaginal discharge 03/18/2014   Vaginal itching 03/18/2014    Past Surgical History:  Procedure Laterality Date   ABDOMINAL HYSTERECTOMY     partial- 1 ovary and cervix are intact   APPENDECTOMY     CESAREAN SECTION  2009, 2012   x2   LAPAROSCOPIC APPENDECTOMY N/A 10/11/2016   Procedure: APPENDECTOMY LAPAROSCOPIC;  Surgeon: Virl Cagey, MD;  Location: [facility];  Service: General;  Laterality: N/A;   LYSIS OF ADHESION N/A 02/19/2013   Procedure: LYSIS OF ADHESION;  Surgeon: [name], MD;  Location: [facility];  Service: Gynecology;  Laterality: N/A;   MASS EXCISION N/A 07/23/2012   Procedure: EXCISION OF RIGHT LOWER ABDOMINAL MASS, ENDOMETRIAL IMPLANT;  Surgeon: Scherry Ran, MD;  Location: [facility];  Service: General;  Laterality: N/A;   SCAR REVISION N/A 02/19/2013   Procedure: EXCISION OF SCAR ENDOMETRIOSIS;  Surgeon: [name], MD;  Location: [facility];  Service: Gynecology;  Laterality: N/A;   SUPRACERVICAL ABDOMINAL HYSTERECTOMY N/A  02/19/2013   Procedure: HYSTERECTOMY SUPRACERVICAL ABDOMINAL;  Surgeon: [name], MD;  Location: [facility];  Service: Gynecology;  Laterality: N/A;   TONSILLECTOMY     TUBAL LIGATION      Family History  Problem Relation Age of Onset   Hypertension Father    Diabetes Other    Heart disease Other    Colon cancer Other    Heart disease Other    Colon cancer Paternal Grandmother    Diabetes Maternal Grandmother    Heart disease Maternal Grandmother    Heart disease Maternal Grandfather     Social History   Socioeconomic History   Marital status: Married    Spouse name: Not on file   Number of children: 2   Years of education: Not on file   Highest education level: Not on file  Occupational History   Occupation: Marketing executive  Tobacco Use   Smoking status: Never   Smokeless tobacco: Never  Vaping Use   Vaping Use: Never used  Substance and Sexual Activity   Alcohol use: Yes    Comment: occasional   Drug use: No   Sexual activity: Yes    Birth control/protection: Surgical    Comment: hyst  Other Topics Concern   Not on file  Social History Narrative   2 sons   Social Determinants of Health   Financial Resource Strain: Not  on file  Food Insecurity: Not on file  Transportation Needs: Not on file  Physical Activity: Not on file  Stress: Not on file  Social Connections: Not on file  Intimate Partner Violence: Not on file    Outpatient Medications Prior to Visit  Medication Sig Dispense Refill   cetirizine (ZYRTEC) 10 MG tablet Take 1 tablet (10 mg total) by mouth daily. 30 tablet 0   fluticasone (FLONASE) 50 MCG/ACT nasal spray Place 2 sprays into both nostrils daily. 16 g 0   ibuprofen (ADVIL,MOTRIN) 200 MG tablet Take 200-400 mg by mouth every 6 (six) hours as needed for mild pain.     sertraline (ZOLOFT) 50 MG tablet Take 1 tablet (50 mg total) by mouth daily. 30 tablet 3   No facility-administered medications prior to visit.    Allergies  Allergen  Reactions   Codeine Hives and Itching    Review of Systems  Constitutional: Negative.   Respiratory: Negative.    Cardiovascular: Negative.   Psychiatric/Behavioral:  Negative for self-injury and suicidal ideas. The patient is nervous/anxious.        Sleeps 8 hours per night, but still wakes up feeling tired      Objective:    Physical Exam Constitutional:      Appearance: Normal appearance. She is obese.  Cardiovascular:     Rate and Rhythm: Normal rate and regular rhythm.     Pulses: Normal pulses.     Heart sounds: Normal heart sounds.  Pulmonary:     Effort: Pulmonary effort is normal.     Breath sounds: Normal breath sounds.  Neurological:     Mental Status: She is alert.  Psychiatric:        Mood and Affect: Mood normal.        Behavior: Behavior normal.        Thought Content: Thought content normal.        Judgment: Judgment normal.    BP 122/83 (BP Location: Left Arm, Patient Position: Sitting, Cuff Size: Large)   Pulse 72   Temp 98.3 F (36.8 C) (Oral)   Ht $R'5\' 1"'Cg$  (1.549 m)   Wt 231 lb (104.8 kg)   LMP 01/08/2013 Comment: [place]  SpO2 97%   BMI 43.65 kg/m  Wt Readings from Last 3 Encounters:  10/06/20 231 lb (104.8 kg)  09/09/20 235 lb (106.6 kg)  05/04/20 229 lb (103.9 kg)    There are no preventive care reminders to display for this patient.  There are no preventive care reminders to display for this patient.   Lab Results  Component Value Date   TSH 2.080 05/01/2020   Lab Results  Component Value Date   WBC 9.3 09/09/2020   HGB 13.8 09/09/2020   HCT 41.0 09/09/2020   MCV 85.1 09/09/2020   PLT 282 09/09/2020   Lab Results  Component Value Date   NA 137 09/09/2020   K 3.4 (L) 09/09/2020   CO2 24 09/09/2020   GLUCOSE 127 (H) 09/09/2020   BUN 12 09/09/2020   CREATININE 0.99 09/09/2020   BILITOT 0.4 05/01/2020   ALKPHOS 85 05/01/2020   AST 12 05/01/2020   ALT 12 05/01/2020   PROT 6.8 05/01/2020   ALBUMIN 4.3 05/01/2020   CALCIUM 9.1  09/09/2020   ANIONGAP 8 09/09/2020   EGFR 85 05/01/2020   Lab Results  Component Value Date   CHOL 187 05/01/2020   Lab Results  Component Value Date   HDL 45 05/01/2020   Lab Results  Component Value Date   LDLCALC 118 (H) 05/01/2020   Lab Results  Component Value Date   TRIG 137 05/01/2020   No results found for: [facility] Lab Results  Component Value Date   HGBA1C 6.0 (H) 05/01/2020       Assessment & Plan:   Problem List Items Addressed This Visit       Other   Fatigue    -rechecking labs -home sleep study to [facility]      Relevant Orders   TSH   Anxiety - Primary    -restarted zoloft about a month ago, and that helped a little, but no much -she states she has inattention and forgets why she walks into rooms and gets irritated easily -feels like she is in a tornado all the time and she can't get out of it -referral to psych -MDQ screened negative, not sure if increasing sertraline would be helpful d/t marginal improvement with low dose; would like psych to screen for ADHD (seems late onset) -OSA may be contributory with poor sleep quality, home sleep test ordered at last OV, but she states she wasn't contacted       Relevant Orders   Ambulatory referral to Psychiatry   CBC with Differential/Platelet   CMP14+EGFR   TSH     No orders of the defined types were placed in this encounter.    Noreene Larsson, NP

## 2020-10-06 NOTE — Patient Instructions (Signed)
Please have labs drawn today

## 2020-10-06 NOTE — Assessment & Plan Note (Addendum)
-  restarted zoloft about a month ago, and that helped a little, but no much -she states she has inattention and forgets why she walks into rooms and gets irritated easily -feels like she is in a tornado all the time and she can't get out of it -referral to psych -MDQ screened negative, not sure if increasing sertraline would be helpful d/t marginal improvement with low dose; would like psych to screen for ADHD (seems late onset) -OSA may be contributory with poor sleep quality, home sleep test ordered at last OV, but she states she wasn't contacted

## 2020-10-06 NOTE — Assessment & Plan Note (Signed)
-  rechecking labs -home sleep study to [facility]

## 2020-10-07 LAB — CBC WITH DIFFERENTIAL/PLATELET
Basophils Absolute: 0 10*3/uL (ref 0.0–0.2)
Basos: 0 %
EOS (ABSOLUTE): 0.1 10*3/uL (ref 0.0–0.4)
Eos: 1 %
Hematocrit: 44.9 % (ref 34.0–46.6)
Hemoglobin: 14.6 g/dL (ref 11.1–15.9)
Immature Grans (Abs): 0 10*3/uL (ref 0.0–0.1)
Immature Granulocytes: 0 %
Lymphocytes Absolute: 2.2 10*3/uL (ref 0.7–3.1)
Lymphs: 23 %
MCH: 27.8 pg (ref 26.6–33.0)
MCHC: 32.5 g/dL (ref 31.5–35.7)
MCV: 85 fL (ref 79–97)
Monocytes Absolute: 0.7 10*3/uL (ref 0.1–0.9)
Monocytes: 7 %
Neutrophils Absolute: 6.6 10*3/uL (ref 1.4–7.0)
Neutrophils: 69 %
Platelets: 304 10*3/uL (ref 150–450)
RBC: 5.26 x10E6/uL (ref 3.77–5.28)
RDW: 13.1 % (ref 11.7–15.4)
WBC: 9.6 10*3/uL (ref 3.4–10.8)

## 2020-10-07 LAB — CMP14+EGFR
ALT: 16 IU/L (ref 0–32)
AST: 17 IU/L (ref 0–40)
Albumin/Globulin Ratio: 2 (ref 1.2–2.2)
Albumin: 4.6 g/dL (ref 3.8–4.8)
Alkaline Phosphatase: 90 IU/L (ref 44–121)
BUN/Creatinine Ratio: 10 (ref 9–23)
BUN: 10 mg/dL (ref 6–20)
Bilirubin Total: 0.2 mg/dL (ref 0.0–1.2)
CO2: 26 mmol/L (ref 20–29)
Calcium: 9.7 mg/dL (ref 8.7–10.2)
Chloride: 101 mmol/L (ref 96–106)
Creatinine, Ser: 0.99 mg/dL (ref 0.57–1.00)
Globulin, Total: 2.3 g/dL (ref 1.5–4.5)
Glucose: 70 mg/dL (ref 65–99)
Potassium: 4 mmol/L (ref 3.5–5.2)
Sodium: 140 mmol/L (ref 134–144)
Total Protein: 6.9 g/dL (ref 6.0–8.5)
eGFR: 77 mL/min/{1.73_m2} (ref >59)

## 2020-10-07 LAB — TSH: TSH: 2.72 u[IU]/mL (ref 0.450–4.500)

## 2020-10-07 NOTE — Progress Notes (Signed)
Her labs look great. Thyroid is within normal limits.

## 2020-10-14 ENCOUNTER — Ambulatory Visit (INDEPENDENT_AMBULATORY_CARE_PROVIDER_SITE_OTHER): Payer: Managed Care, Other (non HMO) | Admitting: Licensed Clinical Social Worker

## 2020-10-14 ENCOUNTER — Other Ambulatory Visit: Payer: Self-pay

## 2020-10-14 DIAGNOSIS — F419 Anxiety disorder, unspecified: Secondary | ICD-10-CM

## 2020-10-14 NOTE — Progress Notes (Signed)
Unable to reach Patient for her appt; number disconnected

## 2020-12-02 ENCOUNTER — Ambulatory Visit (INDEPENDENT_AMBULATORY_CARE_PROVIDER_SITE_OTHER): Payer: Managed Care, Other (non HMO) | Admitting: Internal Medicine

## 2020-12-02 ENCOUNTER — Encounter: Payer: Self-pay | Admitting: Internal Medicine

## 2020-12-02 ENCOUNTER — Other Ambulatory Visit: Payer: Self-pay

## 2020-12-02 ENCOUNTER — Telehealth: Payer: Self-pay

## 2020-12-02 DIAGNOSIS — O321XX0 Maternal care for breech presentation, not applicable or unspecified: Secondary | ICD-10-CM | POA: Insufficient documentation

## 2020-12-02 DIAGNOSIS — R030 Elevated blood-pressure reading, without diagnosis of hypertension: Secondary | ICD-10-CM | POA: Insufficient documentation

## 2020-12-02 DIAGNOSIS — J309 Allergic rhinitis, unspecified: Secondary | ICD-10-CM | POA: Insufficient documentation

## 2020-12-02 DIAGNOSIS — M545 Low back pain, unspecified: Secondary | ICD-10-CM | POA: Insufficient documentation

## 2020-12-02 DIAGNOSIS — H5210 Myopia, unspecified eye: Secondary | ICD-10-CM | POA: Insufficient documentation

## 2020-12-02 DIAGNOSIS — O139 Gestational [pregnancy-induced] hypertension without significant proteinuria, unspecified trimester: Secondary | ICD-10-CM | POA: Insufficient documentation

## 2020-12-02 DIAGNOSIS — F419 Anxiety disorder, unspecified: Secondary | ICD-10-CM | POA: Diagnosis not present

## 2020-12-02 DIAGNOSIS — M999 Biomechanical lesion, unspecified: Secondary | ICD-10-CM | POA: Insufficient documentation

## 2020-12-02 DIAGNOSIS — O99810 Abnormal glucose complicating pregnancy: Secondary | ICD-10-CM | POA: Insufficient documentation

## 2020-12-02 DIAGNOSIS — M25559 Pain in unspecified hip: Secondary | ICD-10-CM | POA: Insufficient documentation

## 2020-12-02 DIAGNOSIS — R109 Unspecified abdominal pain: Secondary | ICD-10-CM | POA: Insufficient documentation

## 2020-12-02 DIAGNOSIS — R6882 Decreased libido: Secondary | ICD-10-CM

## 2020-12-02 DIAGNOSIS — O210 Mild hyperemesis gravidarum: Secondary | ICD-10-CM | POA: Insufficient documentation

## 2020-12-02 DIAGNOSIS — C569 Malignant neoplasm of unspecified ovary: Secondary | ICD-10-CM

## 2020-12-02 DIAGNOSIS — H52229 Regular astigmatism, unspecified eye: Secondary | ICD-10-CM | POA: Insufficient documentation

## 2020-12-02 DIAGNOSIS — Z23 Encounter for immunization: Secondary | ICD-10-CM | POA: Insufficient documentation

## 2020-12-02 DIAGNOSIS — O2390 Unspecified genitourinary tract infection in pregnancy, unspecified trimester: Secondary | ICD-10-CM | POA: Insufficient documentation

## 2020-12-02 DIAGNOSIS — H6690 Otitis media, unspecified, unspecified ear: Secondary | ICD-10-CM | POA: Insufficient documentation

## 2020-12-02 DIAGNOSIS — B070 Plantar wart: Secondary | ICD-10-CM | POA: Insufficient documentation

## 2020-12-02 HISTORY — DX: Gestational (pregnancy-induced) hypertension without significant proteinuria, unspecified trimester: O13.9

## 2020-12-02 HISTORY — DX: Malignant neoplasm of unspecified ovary: C56.9

## 2020-12-02 MED ORDER — SERTRALINE HCL 50 MG PO TABS: 50.0000 mg | ORAL_TABLET | Freq: Every day | ORAL | 3 refills | Status: DC

## 2020-12-02 NOTE — Progress Notes (Signed)
Virtual Visit via Telephone Note   This visit type was conducted due to national recommendations for restrictions regarding the COVID-19 Pandemic (e.g. social distancing) in an effort to limit this patient's exposure and mitigate transmission in our community.  Due to her co-morbid illnesses, this patient is at least at moderate risk for complications without adequate follow up.  This format is felt to be most appropriate for this patient at this time.  The patient did not have access to video technology/had technical difficulties with video requiring transitioning to audio format only (telephone).  All issues noted in this document were discussed and addressed.  No physical exam could be performed with this format.  Evaluation Performed:  Follow-up visit  Date:  12/02/2020   ID:  BLESSIN [name], DOB July 26, 1985, MRN [id number]  Patient Location: Home Provider Location: Office/Clinic  Participants: Patient Location of Patient: Home Location of Provider: Telehealth Consent was obtain for visit to be over via telehealth. I verified that I am speaking with the correct person using two identifiers.  PCP:  Noreene Larsson, NP   Chief Complaint: Anxiety  History of Present Illness:    [name] is a 35 y.o. female who has a televisit for follow-up of GAD.  She has been doing well overall with Zoloft.  Denies any spells of anxiety recently.  She does report mild anhedonia and decrease in sexual drive.  She has had partial hysterectomy, but has not followed up with OB/GYN recently.  The patient does not have symptoms concerning for COVID-19 infection (fever, chills, cough, or new shortness of breath).   Past Medical, Surgical, Social History, Allergies, and Medications have been Reviewed.  Past Medical History:  Diagnosis Date   Breast nodule 03/25/2014   BV (bacterial vaginosis) 03/18/2014   Gestational hypertension 12/02/2020   Hypoglycemia    Ovarian cancer ([place]) 12/02/2020    Vaginal discharge 03/18/2014   Vaginal itching 03/18/2014   Past Surgical History:  Procedure Laterality Date   ABDOMINAL HYSTERECTOMY     partial- 1 ovary and cervix are intact   APPENDECTOMY     CESAREAN SECTION  2009, 2012   x2   LAPAROSCOPIC APPENDECTOMY N/A 10/11/2016   Procedure: APPENDECTOMY LAPAROSCOPIC;  Surgeon: Virl Cagey, MD;  Location: [facility];  Service: General;  Laterality: N/A;   LYSIS OF ADHESION N/A 02/19/2013   Procedure: LYSIS OF ADHESION;  Surgeon: [name], MD;  Location: [facility];  Service: Gynecology;  Laterality: N/A;   MASS EXCISION N/A 07/23/2012   Procedure: EXCISION OF RIGHT LOWER ABDOMINAL MASS, ENDOMETRIAL IMPLANT;  Surgeon: Scherry Ran, MD;  Location: [facility];  Service: General;  Laterality: N/A;   SCAR REVISION N/A 02/19/2013   Procedure: EXCISION OF SCAR ENDOMETRIOSIS;  Surgeon: [name], MD;  Location: [facility];  Service: Gynecology;  Laterality: N/A;   SUPRACERVICAL ABDOMINAL HYSTERECTOMY N/A 02/19/2013   Procedure: HYSTERECTOMY SUPRACERVICAL ABDOMINAL;  Surgeon: [name], MD;  Location: [facility];  Service: Gynecology;  Laterality: N/A;   TONSILLECTOMY     TUBAL LIGATION       Current Meds  Medication Sig   cetirizine (ZYRTEC) 10 MG tablet Take 1 tablet (10 mg total) by mouth daily.   fluticasone (FLONASE) 50 MCG/ACT nasal spray Place 2 sprays into both nostrils daily.   ibuprofen (ADVIL,MOTRIN) 200 MG tablet Take 200-400 mg by mouth every 6 (six) hours as needed for mild pain.   [DISCONTINUED] sertraline (ZOLOFT) 50 MG tablet  Take 1 tablet (50 mg total) by mouth daily.     Allergies:   Codeine   ROS:   Please see the history of present illness.     All other systems reviewed and are negative.   Labs/Other Tests and Data Reviewed:    Recent Labs: 10/06/2020: ALT 16; BUN 10; Creatinine, Ser 0.99; Hemoglobin 14.6; Platelets 304; Potassium 4.0; Sodium 140; TSH 2.720   Recent Lipid Panel Lab Results  Component Value  Date/Time   CHOL 187 05/01/2020 08:34 AM   TRIG 137 05/01/2020 08:34 AM   HDL 45 05/01/2020 08:34 AM   LDLCALC 118 (H) 05/01/2020 08:34 AM    Wt Readings from Last 3 Encounters:  10/06/20 231 lb (104.8 kg)  09/09/20 235 lb (106.6 kg)  05/04/20 229 lb (103.9 kg)     ASSESSMENT & PLAN:    Anxiety Well-controlled with Zoloft now Relaxation techniques material provided Referred to [facility] therapy  Decreased sexual drive Could be a symptom of depression or hormonal imbalance Advised to discuss with OB/GYN for possible HRT   Time:   Today, I have spent 12 minutes reviewing the chart, including problem list, medications, and with the patient with telehealth technology discussing the above problems.   Medication Adjustments/Labs and Tests Ordered: Current medicines are reviewed at length with the patient today.  Concerns regarding medicines are outlined above.   Tests Ordered: No orders of the defined types were placed in this encounter.   Medication Changes: Meds ordered this encounter  Medications   sertraline (ZOLOFT) 50 MG tablet    Sig: Take 1 tablet (50 mg total) by mouth daily.    Dispense:  30 tablet    Refill:  3     Note: This dictation was prepared with Dragon dictation along with smaller phrase technology. Similar sounding words can be transcribed inadequately or may not be corrected upon review. Any transcriptional errors that result from this process are unintentional.      Disposition:  Follow up  Signed, Lindell Spar, MD  12/02/2020 10:29 AM     [place]

## 2020-12-02 NOTE — Telephone Encounter (Signed)
I have called and LMOM for pt to check her my chart for appt with Nichole.  She is scheduled.

## 2020-12-02 NOTE — Patient Instructions (Addendum)
Please continue to take Zoloft as prescribed.  Please discuss with your Ob/Gyn regarding estrogen therapy for sexual dysfunction.

## 2020-12-02 NOTE — Assessment & Plan Note (Addendum)
Well-controlled with Zoloft now Relaxation techniques material provided Referred to [facility] therapy

## 2020-12-11 ENCOUNTER — Ambulatory Visit: Payer: Managed Care, Other (non HMO) | Admitting: Nurse Practitioner

## 2020-12-15 ENCOUNTER — Other Ambulatory Visit: Payer: Self-pay

## 2020-12-15 ENCOUNTER — Ambulatory Visit (INDEPENDENT_AMBULATORY_CARE_PROVIDER_SITE_OTHER): Payer: Managed Care, Other (non HMO) | Admitting: Licensed Clinical Social Worker

## 2020-12-15 DIAGNOSIS — Z91199 Patient's noncompliance with other medical treatment and regimen due to unspecified reason: Secondary | ICD-10-CM

## 2021-01-28 ENCOUNTER — Ambulatory Visit: Payer: Managed Care, Other (non HMO) | Admitting: Adult Health

## 2021-05-05 ENCOUNTER — Encounter: Payer: Managed Care, Other (non HMO) | Admitting: Nurse Practitioner

## 2021-05-05 ENCOUNTER — Encounter: Payer: BC Managed Care – PPO | Admitting: Nurse Practitioner

## 2021-05-25 NOTE — BH Specialist Note (Signed)
No show

## 2021-05-31 ENCOUNTER — Encounter: Payer: Self-pay | Admitting: Adult Health

## 2021-05-31 ENCOUNTER — Ambulatory Visit: Payer: BC Managed Care – PPO | Admitting: Adult Health

## 2021-05-31 VITALS — BP 121/84 | HR 72 | Ht 60.0 in | Wt 230.0 lb

## 2021-05-31 DIAGNOSIS — F419 Anxiety disorder, unspecified: Secondary | ICD-10-CM | POA: Diagnosis not present

## 2021-05-31 DIAGNOSIS — F32A Depression, unspecified: Secondary | ICD-10-CM

## 2021-05-31 MED ORDER — SERTRALINE HCL 25 MG PO TABS: ORAL_TABLET | ORAL | 2 refills | Status: DC

## 2021-05-31 NOTE — Progress Notes (Signed)
?  Subjective:  ?  ? Patient ID: [name], female   DOB: [birth year], 36 y.o.   MRN: [id number] ? ?HPI ?Myrlene is a 36 year old, white female, married, sp [facility] in for follow up on Zoloft and it has helped, but since lost brother end of January, [age]  wants to sleep or is wide open, she says. ? ?Lab Results  ?Component Value Date  ? DIAGPAP  08/22/2018  ?  NEGATIVE FOR INTRAEPITHELIAL LESIONS OR MALIGNANCY.  ? HPV NOT Detected 08/22/2018  ? PCP Dr Posey Pronto ? ?Review of Systems ?Zoloft has helped, but since lost brother end of January, either wants to sleep or is wide open she says ?Reviewed past medical,surgical, social and family history. Reviewed medications and allergies.  ?   ?Objective:  ? Physical Exam ?BP 121/84 (BP Location: Right Arm, Patient Position: Sitting, Cuff Size: Normal)   Pulse 72   Ht 5' (1.524 m)   Wt 230 lb (104.3 kg)   LMP 01/08/2013 Comment: [place]  BMI 44.92 kg/m?   ?  Skin warm and dry.  Lungs: clear to ausculation bilaterally. Cardiovascular: regular rate and rhythm.  ? ? ?  05/31/2021  ? 11:28 AM 12/02/2020  ?  9:53 AM 10/06/2020  ? 10:51 AM  ?Depression screen PHQ 2/9  ?Decreased Interest 0 0 1  ?Down, Depressed, Hopeless 0 0 0  ?PHQ - 2 Score 0 0 1  ?Altered sleeping 2    ?Tired, decreased energy 3    ?Change in appetite 0    ?Feeling bad or failure about yourself  0    ?Trouble concentrating 0    ?Moving slowly or fidgety/restless 3    ?Suicidal thoughts 0    ?PHQ-9 Score 8    ?  ? ?  05/31/2021  ? 11:29 AM 10/06/2020  ? 10:51 AM  ?GAD 7 : Generalized Anxiety Score  ?Nervous, Anxious, on Edge 0 1  ?Control/stop worrying 0 0  ?Worry too much - different things 0 0  ?Trouble relaxing 0 2  ?Restless 1 0  ?Easily annoyed or irritable 2 3  ?Afraid - awful might happen 0 0  ?Total GAD 7 Score 3 6  ?Anxiety Difficulty  Not difficult at all  ? ? [place] - 05/31/21 1128   ? ?  ? Pregnancy Intention Screening  ? Does the patient want to become pregnant in the next year? No   ? Does the patient's  partner want to become pregnant in the next year? No   ? Would the patient like to discuss contraceptive options today? No   ?  ? Contraception Wrap Up  ? Current Method Female Sterilization   hyst  ? End Method Female Sterilization   hyst  ? Contraception Counseling Provided No   ? ?  ?  ? ?  ?  ?  ?Assessment:  ?   ?1. Anxiety and depression ?Will increase zoloft to 75 mg  ?Meds ordered this encounter  ?Medications  ? sertraline (ZOLOFT) 25 MG tablet  ?  Sig: Take 3 daily at same time  ?  Dispense:  90 tablet  ?  Refill:  2  ?  Order Specific Question:   Supervising Provider  ?  Answer:   Tania Ade H [[id number]]  ?  ?   ?Plan:  ?   ?Return 08/05/21 for pap and physical and ROS. ?   ?

## 2021-06-08 ENCOUNTER — Ambulatory Visit
Admission: EM | Admit: 2021-06-08 | Discharge: 2021-06-08 | Disposition: A | Discharge status: Home / Self Care | Payer: BC Managed Care – PPO | Attending: Family Medicine | Admitting: Family Medicine

## 2021-06-08 DIAGNOSIS — S39012A Strain of muscle, fascia and tendon of lower back, initial encounter: Secondary | ICD-10-CM

## 2021-06-08 MED ORDER — CYCLOBENZAPRINE HCL 10 MG PO TABS: 10.0000 mg | ORAL_TABLET | Freq: Three times a day (TID) | ORAL | 0 refills | Status: DC | PRN

## 2021-06-08 NOTE — ED Provider Notes (Signed)
?[facility] ? ? ? ?CSN: [id number] ?Arrival date & time: 06/08/21  1112 ? ? ?  ? ?History   ?Chief Complaint ?Chief Complaint  ?Patient presents with  ? Back Pain  ? ? ?HPI ?[name] is a 36 y.o. female.  ? 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  She denies radiation of pain down the legs, bowel or bladder incontinence, fever, saddle anesthesia, weakness.  Taking ibuprofen and Tylenol with no significant benefit.  No known history of chronic back issues. ? ? ?Past Medical History:  ?Diagnosis Date  ? Breast nodule 03/25/2014  ? BV (bacterial vaginosis) 03/18/2014  ? Gestational hypertension 12/02/2020  ? Hypoglycemia   ? Ovarian cancer (Newborn) 12/02/2020  ? Vaginal discharge 03/18/2014  ? Vaginal itching 03/18/2014  ? ? ?Patient Active Problem List  ? Diagnosis Date Noted  ? Anxiety and depression 05/31/2021  ? Abdominal pain 12/02/2020  ? Abnormal maternal glucose tolerance, antepartum 12/02/2020  ? Allergic rhinitis 12/02/2020  ? Breech presentation 12/02/2020  ? Elevated blood-pressure reading without diagnosis of hypertension 12/02/2020  ? Fetus or newborn affected by maternal hypertensive disorder 12/02/2020  ? Gestational hypertension 12/02/2020  ? Hyperemesis gravidarum 12/02/2020  ? Infection of genitourinary tract antepartum 12/02/2020  ? Low back pain 12/02/2020  ? Myopia 12/02/2020  ? Need for prophylactic vaccination and inoculation against single disease 12/02/2020  ? Nonallopathic lesion of lumbar region 12/02/2020  ? Nonallopathic lesion of sacral region 12/02/2020  ? Otitis media 12/02/2020  ? Ovarian cancer ([place]) 12/02/2020  ? Pain in joint, pelvic region and thigh 12/02/2020  ? Plantar wart 12/02/2020  ? Regular astigmatism 12/02/2020  ? Anxiety 10/06/2020  ? Impaired fasting blood sugar 05/04/2020  ? Hyperlipidemia 05/04/2020  ? Fatigue 05/04/2020  ? Morbid obesity ([place])  04/20/2020  ? Screening due 04/20/2020  ? Encounter to establish care 08/22/2018  ? [place] 08/22/2018  ? Weight gain 08/22/2018  ? Dry hair 08/22/2018  ? Hot flashes 08/22/2018  ? Headache 05/29/2015  ? Erythrocytosis 05/28/2015  ? Vision changes 05/28/2015  ? Breast nodule 03/25/2014  ? Pelvic peritoneal adhesions, female 02/19/2013  ? Status post hysterectomy 02/19/2013  ? ? ?Past Surgical History:  ?Procedure Laterality Date  ? ABDOMINAL HYSTERECTOMY    ? partial- 1 ovary and cervix are intact  ? APPENDECTOMY    ? CESAREAN SECTION  2009, 2012  ? x2  ? LAPAROSCOPIC APPENDECTOMY N/A 10/11/2016  ? Procedure: APPENDECTOMY LAPAROSCOPIC;  Surgeon: Virl Cagey, MD;  Location: [facility];  Service: General;  Laterality: N/A;  ? LYSIS OF ADHESION N/A 02/19/2013  ? Procedure: LYSIS OF ADHESION;  Surgeon: [name], MD;  Location: [facility];  Service: Gynecology;  Laterality: N/A;  ? MASS EXCISION N/A 07/23/2012  ? Procedure: EXCISION OF RIGHT LOWER ABDOMINAL MASS, ENDOMETRIAL IMPLANT;  Surgeon: Scherry Ran, MD;  Location: [facility];  Service: General;  Laterality: N/A;  ? SCAR REVISION N/A 02/19/2013  ? Procedure: EXCISION OF SCAR ENDOMETRIOSIS;  Surgeon: [name], MD;  Location: [facility];  Service: Gynecology;  Laterality: N/A;  ? SUPRACERVICAL ABDOMINAL HYSTERECTOMY N/A 02/19/2013  ? Procedure: HYSTERECTOMY SUPRACERVICAL ABDOMINAL;  Surgeon: [name], MD;  Location: [facility];  Service: Gynecology;  Laterality: N/A;  ? TONSILLECTOMY    ? TUBAL LIGATION    ? ? ?OB History   ? ? Gravida  ?2  ? Para  ?  2  ? Term  ?2  ? Preterm  ?   ? AB  ?   ? Living  ?2  ?  ? ? SAB  ?   ? IAB  ?   ? Ectopic  ?   ? Multiple  ?   ? Live Births  ?2  ?   ?  ?  ? ? ? ?Home Medications   ? ?Prior to Admission medications   ?Medication Sig Start Date End Date Taking? Authorizing Provider  ?cyclobenzaprine (FLEXERIL) 10 MG tablet Take 1 tablet (10 mg total) by mouth 3 (three) times daily as needed for muscle spasms. Do not drink  alcohol or drive while taking this medication.  May cause drowsiness. 06/08/21  Yes Volney American, PA-C  ?cetirizine (ZYRTEC) 10 MG tablet Take 1 tablet (10 mg total) by mouth daily. 04/06/20   Scot Jun, FNP  ?fluticasone (FLONASE) 50 MCG/ACT nasal spray Place 2 sprays into both nostrils daily. 04/06/20   Scot Jun, FNP  ?ibuprofen (ADVIL,MOTRIN) 200 MG tablet Take 200-400 mg by mouth every 6 (six) hours as needed for mild pain.    [provider]  ?sertraline (ZOLOFT) 25 MG tablet Take 3 daily at same time 05/31/21   Estill Dooms, NP  ? ? ?Family History ?Family History  ?Problem Relation Age of Onset  ? Hypertension Father   ? Diabetes Other   ? Heart disease Other   ? Colon cancer Other   ? Heart disease Other   ? Colon cancer Paternal Grandmother   ? Diabetes Maternal Grandmother   ? Heart disease Maternal Grandmother   ? Heart disease Maternal Grandfather   ? ? ?Social History ?Social History  ? ?Tobacco Use  ? Smoking status: Never  ? Smokeless tobacco: Never  ?Vaping Use  ? Vaping Use: Never used  ?Substance Use Topics  ? Alcohol use: Yes  ?  Comment: occasional  ? Drug use: No  ? ? ? ?Allergies   ?Codeine ? ? ?Review of Systems ?Review of Systems ?Per HPI ? ?Physical Exam ?Triage Vital Signs ?ED Triage Vitals [06/08/21 1211]  ?Enc Vitals Group  ?   BP (!) 141/91  ?   Pulse Rate 83  ?   Resp 16  ?   Temp 98.8 ?F (37.1 ?C)  ?   Temp Source Oral  ?   SpO2 96 %  ?   Weight 230 lb (104.3 kg)  ?   Height 5' (1.524 m)  ?   Head Circumference   ?   Peak Flow   ?   Pain Score 6  ?   Pain Loc   ?   Pain Edu?   ?   Excl. in [place]?   ? ?No data found. ? ?Updated Vital Signs ?BP (!) 141/91   Pulse 83   Temp 98.8 ?F (37.1 ?C) (Oral)   Resp 16   Ht 5' (1.524 m)   Wt 230 lb (104.3 kg)   LMP 01/08/2013 Comment: [place]  SpO2 96%   BMI 44.92 kg/m?  ? ?Visual Acuity ?Right Eye Distance:   ?Left Eye Distance:   ?Bilateral Distance:   ? ?Right Eye Near:   ?Left Eye Near:    ?Bilateral  Near:    ? ?Physical Exam ?Vitals and nursing note reviewed.  ?Constitutional:   ?   Appearance: Normal appearance. She is not ill-appearing.  ?HENT:  ?   Head: Atraumatic.  ?   Mouth/Throat:  ?  Mouth: Mucous membranes are moist.  ?Eyes:  ?   Extraocular Movements: Extraocular movements intact.  ?   Conjunctiva/sclera: Conjunctivae normal.  ?Cardiovascular:  ?   Rate and Rhythm: Normal rate and regular rhythm.  ?   Heart sounds: Normal heart sounds.  ?Pulmonary:  ?   Effort: Pulmonary effort is normal.  ?   Breath sounds: Normal breath sounds.  ?Musculoskeletal:     ?   General: Tenderness and signs of injury present. No swelling or deformity. Normal range of motion.  ?   Cervical back: Normal range of motion and neck supple.  ?   Comments: No midline spinal tenderness to palpation diffusely.  Negative straight leg raise bilateral lower extremities.  Right lumbar musculature tender to palpation and mild spasm  ?Skin: ?   General: Skin is warm and dry.  ?Neurological:  ?   Mental Status: She is alert and oriented to person, place, and time.  ?   Motor: No weakness.  ?   Gait: Gait normal.  ?   Comments: Right lower extremity neurovascularly intact  ?Psychiatric:     ?   Mood and Affect: Mood normal.     ?   Thought Content: Thought content normal.     ?   Judgment: Judgment normal.  ? ? ? ?UC Treatments / Results  ?Labs ?(all labs ordered are listed, but only abnormal results are displayed) ?Labs Reviewed - No data to display ? ?EKG ? ? ?Radiology ?No results found. ? ?Procedures ?Procedures (including critical care time) ? ?Medications Ordered in UC ?Medications - No data to display ? ?Initial Impression / Assessment and Plan / UC Course  ?I have reviewed the triage vital signs and the nursing notes. ? ?Pertinent labs & imaging results that were available during my care of the patient were reviewed by me and considered in my medical decision making (see chart for details). ? ?  ? ?Treat with Flexeril, ibuprofen,  Tylenol, heat, stretches, massage, rest.  Work note given.  Return for acutely worsening symptoms. ? ?Final Clinical Impressions(s) / UC Diagnoses  ? ?Final diagnoses:  ?Strain of lumbar region, initial encounter  ? ?Discharg

## 2021-06-08 NOTE — ED Triage Notes (Signed)
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  States heat and repositioning helps a little. ?

## 2021-08-05 ENCOUNTER — Other Ambulatory Visit: Payer: BC Managed Care – PPO | Admitting: Adult Health

## 2021-08-12 ENCOUNTER — Other Ambulatory Visit: Payer: BC Managed Care – PPO | Admitting: Adult Health

## 2021-09-05 ENCOUNTER — Other Ambulatory Visit: Payer: Self-pay | Admitting: Adult Health

## 2022-01-07 ENCOUNTER — Other Ambulatory Visit: Payer: Self-pay | Admitting: Adult Health

## 2022-01-24 HISTORY — PX: REFRACTIVE SURGERY: SHX103

## 2022-02-28 ENCOUNTER — Other Ambulatory Visit: Payer: Self-pay | Admitting: Adult Health

## 2022-02-28 MED ORDER — SERTRALINE HCL 25 MG PO TABS: ORAL_TABLET | ORAL | 0 refills | Status: DC

## 2022-02-28 NOTE — Telephone Encounter (Signed)
Refill zoloft, needs appt

## 2022-02-28 NOTE — Telephone Encounter (Signed)
From: Selinda Orion To: Office of Derrek Monaco, [place] Sent: 02/26/2022 3:01 AM EST Subject: Medication Renewal Request  Refills have been requested for the following medications:   sertraline (ZOLOFT) 25 MG tablet [Mattea Seger]  Patient Comment: I am out of this medicine.   Preferred pharmacy: [facility], [place] - [id number] Delivery method: [employer]

## 2022-04-11 NOTE — Progress Notes (Signed)
[facility] Note  04/20/2022     CHIEF COMPLAINT Patient presents for Retina Evaluation   HISTORY OF PRESENT ILLNESS: [name] is a 37 y.o. female who presents to the clinic today for:   HPI     Retina Evaluation   In left eye.  Associated Symptoms Floaters.  Negative for Flashes, Distortion and Blind Spot.  I, the attending physician,  performed the HPI with the patient and updated documentation appropriately.        Comments   Patient is referred here by Dr. Van Clines for retinal holes/tears. She feels that she is looking through a film and when she blinks it goes away. She is using Refresh OU PRN.      Last edited by Bernarda Caffey, MD on 04/20/2022  1:10 PM.    Pt is here on the referral of Dr. Van Clines for concern of retinal holes, pts aw her for a routine exam for contacts and is not having any problems with her vision, pt is not diabetic or hypertensive, pt denies fol or floaters  Referring physician: Janett Billow [facility] [address] [place],  [place] [id number]  HISTORICAL INFORMATION:   Selected notes from the MEDICAL RECORD NUMBER Referred by Dr. Van Clines for concern of retinal tears LEE:  Ocular Hx- PMH-    CURRENT MEDICATIONS: Current Outpatient Medications (Ophthalmic Drugs)  Medication Sig   prednisoLONE acetate (PRED FORTE) 1 % ophthalmic suspension Place 1 drop into the right eye 4 (four) times daily for 7 days.   No current facility-administered medications for this visit. (Ophthalmic Drugs)   Current Outpatient Medications (Other)  Medication Sig   cetirizine (ZYRTEC) 10 MG tablet Take 1 tablet (10 mg total) by mouth daily.   cyclobenzaprine (FLEXERIL) 10 MG tablet Take 1 tablet (10 mg total) by mouth 3 (three) times daily as needed for muscle spasms. Do not drink alcohol or drive while taking this medication.  May cause drowsiness.   fluticasone (FLONASE) 50 MCG/ACT nasal spray Place  2 sprays into both nostrils daily.   ibuprofen (ADVIL,MOTRIN) 200 MG tablet Take 200-400 mg by mouth every 6 (six) hours as needed for mild pain.   sertraline (ZOLOFT) 25 MG tablet TAKE 3 TABLETS BY MOUTH ONCE DAILY AT THE SAME TIME.   No current facility-administered medications for this visit. (Other)   REVIEW OF SYSTEMS:  ALLERGIES Allergies  Allergen Reactions   Codeine Hives and Itching   PAST MEDICAL HISTORY Past Medical History:  Diagnosis Date   Breast nodule 03/25/2014   BV (bacterial vaginosis) 03/18/2014   Gestational hypertension 12/02/2020   Hypoglycemia    Ovarian cancer ([place]) 12/02/2020   Vaginal discharge 03/18/2014   Vaginal itching 03/18/2014   Past Surgical History:  Procedure Laterality Date   ABDOMINAL HYSTERECTOMY     partial- 1 ovary and cervix are intact   APPENDECTOMY     CESAREAN SECTION  2009, 2012   x2   LAPAROSCOPIC APPENDECTOMY N/A 10/11/2016   Procedure: APPENDECTOMY LAPAROSCOPIC;  Surgeon: Virl Cagey, MD;  Location: [facility];  Service: General;  Laterality: N/A;   LYSIS OF ADHESION N/A 02/19/2013   Procedure: LYSIS OF ADHESION;  Surgeon: [name], MD;  Location: [facility];  Service: Gynecology;  Laterality: N/A;   MASS EXCISION N/A 07/23/2012   Procedure: EXCISION OF RIGHT LOWER ABDOMINAL MASS, ENDOMETRIAL IMPLANT;  Surgeon: Scherry Ran, MD;  Location: [facility];  Service: General;  Laterality: N/A;  SCAR REVISION N/A 02/19/2013   Procedure: EXCISION OF SCAR ENDOMETRIOSIS;  Surgeon: [name], MD;  Location: [facility];  Service: Gynecology;  Laterality: N/A;   SUPRACERVICAL ABDOMINAL HYSTERECTOMY N/A 02/19/2013   Procedure: HYSTERECTOMY SUPRACERVICAL ABDOMINAL;  Surgeon: [name], MD;  Location: [facility];  Service: Gynecology;  Laterality: N/A;   TONSILLECTOMY     TUBAL LIGATION      FAMILY HISTORY Family History  Problem Relation Age of Onset   Hypertension Father    Diabetes Other    Heart disease Other    Colon cancer  Other    Heart disease Other    Colon cancer Paternal Grandmother    Diabetes Maternal Grandmother    Heart disease Maternal Grandmother    Heart disease Maternal Grandfather     SOCIAL HISTORY Social History   Tobacco Use   Smoking status: Never   Smokeless tobacco: Never  Vaping Use   Vaping Use: Never used  Substance Use Topics   Alcohol use: Yes    Comment: occasional   Drug use: No         OPHTHALMIC EXAM:  Base Eye Exam     Visual Acuity (Snellen - Linear)       Right Left   Dist [place] 20/70 20/100   Dist ph [place] 20/30 20/30  Not wearing contacts and did not bring her glasses        Tonometry (Tonopen, 8:31 AM)       Right Left   Pressure 20 18         Pupils       Dark Light Shape React APD   Right 4 3 Round Brisk None   Left 4 3 Round Brisk None         Visual Fields       Left Right    Full Full         Extraocular Movement       Right Left    Full, Ortho Full, Ortho         Neuro/Psych     Oriented x3: Yes         Dilation     Both eyes: 1.0% Mydriacyl, 2.5% Phenylephrine @ 8:26 AM           Slit Lamp and Fundus Exam     Slit Lamp Exam       Right Left   Lids/Lashes Normal Normal   Conjunctiva/Sclera White and quiet White and quiet   Cornea Clear Clear   Anterior Chamber deep and clear deep and clear   Iris Round and dilated Round and dilated   Lens Clear Clear   Anterior Vitreous clear clear         Fundus Exam       Right Left   Disc Pink and Sharp, temporal PPA Pink and Sharp, Compact   C/D Ratio 0.1 0.1   Macula Flat, Good foveal reflex, RPE mottling, No heme or edema Flat, Good foveal reflex, RPE mottling, No heme or edema   Vessels mild tortuosity mild tortuosity   Periphery Attached, small flap tear at 1030 -- no SRF; no heme Attached, mild WWOP temporally, pigmented VR tuft with adjacent small retinal break/hole at 0400           Refraction     Manifest Refraction       Sphere Dist  VA   Right -1.75 20/25   Left -1.75 20/25  IMAGING AND PROCEDURES  Imaging and Procedures for 04/20/2022  OCT, Retina - OU - Both Eyes       Right Eye Quality was good. Central Foveal Thickness: 256. Progression has no prior data. Findings include normal foveal contour, no IRF, no SRF.   Left Eye Quality was good. Central Foveal Thickness: 254. Progression has no prior data. Findings include normal foveal contour, no IRF, no SRF.   Notes *Images captured and stored on drive  Diagnosis / Impression:  NFP, no IRF/SRF OU  Clinical management:  See below  Abbreviations: NFP - Normal foveal profile. CME - cystoid macular edema. PED - pigment epithelial detachment. IRF - intraretinal fluid. SRF - subretinal fluid. EZ - ellipsoid zone. ERM - epiretinal membrane. ORA - outer retinal atrophy. ORT - outer retinal tubulation. SRHM - subretinal hyper-reflective material. IRHM - intraretinal hyper-reflective material      Repair Retinal Breaks, Laser - OD - Right Eye       LASER PROCEDURE NOTE  Procedure:  Barrier laser retinopexy using slit lamp laser, RIGHT eye   Diagnosis:   Retinal break, RIGHT eye                     Punctate flap tear at 1030 o'clock anterior to equator   Surgeon: Bernarda Caffey, MD, PhD  Anesthesia: Topical  Informed consent obtained, operative eye marked, and time out performed prior to initiation of laser.   Laser settings:  Lumenis Smart532 laser, slit lamp Lens: [place] PRP 165 Power: 260 mW Spot size: 200 microns Duration: 30 msec  # spots: 171  Placement of laser: Using a [place] PRP 165 contact lens at the slit lamp, laser was placed in three+ confluent rows around punctate flap tear at 1030 oclock anterior to equator.  Complications: None.  Patient tolerated the procedure well and received written and verbal post-procedure care information/education.            ASSESSMENT/PLAN:    ICD-10-CM   1. Retinal break of  both eyes  H33.303 OCT, Retina - OU - Both Eyes    Repair Retinal Breaks, Laser - OD - Right Eye     1. Retinal break OU   - The incidence, risk factors, and natural history of retinal tear was discussed with patient.   - Potential treatment options including laser retinopexy and cryotherapy discussed with patient. - OD punctate flap tear at 1030 - OS pigmented VR tuft with adjacent small retinal break/ hole at 0400 - pt asymptomatic -- retinal defect OS found on routine exam at [place], [place] - recommend laser retinopexy today OU, OD first today, 03.27.24 - pt wishes to proceed with laser - RBA of procedure discussed, questions answered - informed consent obtained and signed - see procedure note - start PF QID OD x7 days - f/u in 2 wks, POV OD, possible laser OS   Ophthalmic Meds Ordered this visit:  Meds ordered this encounter  Medications   prednisoLONE acetate (PRED FORTE) 1 % ophthalmic suspension    Sig: Place 1 drop into the right eye 4 (four) times daily for 7 days.    Dispense:  5 mL    Refill:  0     Return in about 2 weeks (around 05/04/2022) for f/u retinal tear OU, DFE, OCT, laser retinopexy OS.  There are no Patient Instructions on file for this visit.  Explained the diagnoses, plan, and follow up with the patient and they expressed understanding.  Patient expressed understanding  of the importance of proper follow up care.   This document serves as a record of services personally performed by Gardiner Sleeper, MD, PhD. It was created on their behalf by San Jetty. Owens Shark, OA an ophthalmic technician. The creation of this record is the provider's dictation and/or activities during the visit.    Electronically signed by: San Jetty. Owens Shark, [place] 03.27.2024 1:13 PM  Gardiner Sleeper, M.D., Ph.D. Diseases & Surgery of the Retina and Vitreous [facility]  I have reviewed the above documentation for accuracy and completeness, and I agree with the  above. Gardiner Sleeper, M.D., Ph.D. 04/20/22 1:17 PM   Abbreviations: M myopia (nearsighted); A astigmatism; H hyperopia (farsighted); P presbyopia; Mrx spectacle prescription;  CTL contact lenses; OD right eye; OS left eye; OU both eyes  XT exotropia; ET esotropia; PEK punctate epithelial keratitis; PEE punctate epithelial erosions; DES dry eye syndrome; MGD meibomian gland dysfunction; ATs artificial tears; PFAT's preservative free artificial tears; [place] nuclear sclerotic cataract; PSC posterior subcapsular cataract; ERM epi-retinal membrane; PVD posterior vitreous detachment; RD retinal detachment; DM diabetes mellitus; DR diabetic retinopathy; NPDR non-proliferative diabetic retinopathy; PDR proliferative diabetic retinopathy; CSME clinically significant macular edema; DME diabetic macular edema; dbh dot blot hemorrhages; CWS cotton wool spot; POAG primary open angle glaucoma; C/D cup-to-disc ratio; HVF humphrey visual field; GVF goldmann visual field; OCT optical coherence tomography; IOP intraocular pressure; BRVO Branch retinal vein occlusion; CRVO central retinal vein occlusion; CRAO central retinal artery occlusion; BRAO branch retinal artery occlusion; RT retinal tear; SB scleral buckle; PPV pars plana vitrectomy; VH Vitreous hemorrhage; PRP panretinal laser photocoagulation; IVK intravitreal kenalog; VMT vitreomacular traction; MH Macular hole;  NVD neovascularization of the disc; NVE neovascularization elsewhere; AREDS age related eye disease study; ARMD age related macular degeneration; POAG primary open angle glaucoma; EBMD epithelial/anterior basement membrane dystrophy; ACIOL anterior chamber intraocular lens; IOL intraocular lens; PCIOL posterior chamber intraocular lens; Phaco/IOL phacoemulsification with intraocular lens placement; [place] photorefractive keratectomy; LASIK laser assisted in situ keratomileusis; HTN hypertension; DM diabetes mellitus; COPD chronic obstructive pulmonary disease

## 2022-04-20 ENCOUNTER — Ambulatory Visit (INDEPENDENT_AMBULATORY_CARE_PROVIDER_SITE_OTHER): Payer: BC Managed Care – PPO | Admitting: Ophthalmology

## 2022-04-20 ENCOUNTER — Ambulatory Visit: Payer: BC Managed Care – PPO | Admitting: Adult Health

## 2022-04-20 ENCOUNTER — Encounter (INDEPENDENT_AMBULATORY_CARE_PROVIDER_SITE_OTHER): Payer: Self-pay | Admitting: Ophthalmology

## 2022-04-20 DIAGNOSIS — H33303 Unspecified retinal break, bilateral: Secondary | ICD-10-CM | POA: Diagnosis not present

## 2022-04-20 DIAGNOSIS — H3581 Retinal edema: Secondary | ICD-10-CM

## 2022-04-20 MED ORDER — PREDNISOLONE ACETATE 1 % OP SUSP: 1.0000 [drp] | Freq: Four times a day (QID) | OPHTHALMIC | 0 refills | Status: AC

## 2022-04-27 NOTE — Progress Notes (Signed)
[facility] - Clinic Note  04/29/2022     CHIEF COMPLAINT Patient presents for Retina Follow Up   HISTORY OF PRESENT ILLNESS: [name] is a 37 y.o. female who presents to the clinic today for:   HPI     Retina Follow Up   In both eyes.  This started 2 weeks ago.  Duration of 2 weeks.  Since onset it is stable.  I, the attending physician,  performed the HPI with the patient and updated documentation appropriately.        Comments   2 week retina follow up ret tear ou and laser os pt is reporting vision seems stable since last visit she denies any flashes or floaters       Last edited by Rennis Chris, MD on 04/29/2022 10:59 AM.       Referring physician: Heather Roberts, NP [address] [place],  [place] [address]  HISTORICAL INFORMATION:   Selected notes from the MEDICAL RECORD NUMBER Referred by Dr. Burnis Kingfisher for concern of retinal tears LEE:  Ocular Hx- PMH-    CURRENT MEDICATIONS: No current outpatient medications on file. (Ophthalmic Drugs)   No current facility-administered medications for this visit. (Ophthalmic Drugs)   Current Outpatient Medications (Other)  Medication Sig   cephALEXin (KEFLEX) 500 MG capsule Take 1 capsule (500 mg total) by mouth 4 (four) times daily.   cetirizine (ZYRTEC) 10 MG tablet Take 1 tablet (10 mg total) by mouth daily.   cyclobenzaprine (FLEXERIL) 10 MG tablet Take 1 tablet (10 mg total) by mouth 3 (three) times daily as needed for muscle spasms. Do not drink alcohol or drive while taking this medication.  May cause drowsiness.   fluticasone (FLONASE) 50 MCG/ACT nasal spray Place 2 sprays into both nostrils daily.   ibuprofen (ADVIL,MOTRIN) 200 MG tablet Take 200-400 mg by mouth every 6 (six) hours as needed for mild pain.   mupirocin ointment (BACTROBAN) 2 % Apply 1 Application topically daily. Apply twice daily until symptoms improve.   sertraline (ZOLOFT) 25 MG tablet TAKE 3 TABLETS BY MOUTH ONCE  DAILY AT THE SAME TIME.   No current facility-administered medications for this visit. (Other)   REVIEW OF SYSTEMS: ROS   Positive for: Eyes Last edited by Etheleen Mayhew, COT on 04/29/2022  9:09 AM.     ALLERGIES Allergies  Allergen Reactions   Codeine Hives and Itching   PAST MEDICAL HISTORY Past Medical History:  Diagnosis Date   Breast nodule 03/25/2014   BV (bacterial vaginosis) 03/18/2014   Gestational hypertension 12/02/2020   Hypoglycemia    Ovarian cancer 12/02/2020   Vaginal discharge 03/18/2014   Vaginal itching 03/18/2014   Past Surgical History:  Procedure Laterality Date   ABDOMINAL HYSTERECTOMY     partial- 1 ovary and cervix are intact   APPENDECTOMY     CESAREAN SECTION  2009, 2012   x2   LAPAROSCOPIC APPENDECTOMY N/A 10/11/2016   Procedure: APPENDECTOMY LAPAROSCOPIC;  Surgeon: Lucretia Roers, MD;  Location: [facility];  Service: General;  Laterality: N/A;   LYSIS OF ADHESION N/A 02/19/2013   Procedure: LYSIS OF ADHESION;  Surgeon: Tilda Burrow, MD;  Location: [facility];  Service: Gynecology;  Laterality: N/A;   MASS EXCISION N/A 07/23/2012   Procedure: EXCISION OF RIGHT LOWER ABDOMINAL MASS, ENDOMETRIAL IMPLANT;  Surgeon: Marlane Hatcher, MD;  Location: [facility];  Service: General;  Laterality: N/A;   SCAR REVISION N/A 02/19/2013   Procedure: EXCISION  OF SCAR ENDOMETRIOSIS;  Surgeon: Tilda Burrow, MD;  Location: [facility];  Service: Gynecology;  Laterality: N/A;   SUPRACERVICAL ABDOMINAL HYSTERECTOMY N/A 02/19/2013   Procedure: HYSTERECTOMY SUPRACERVICAL ABDOMINAL;  Surgeon: Tilda Burrow, MD;  Location: [facility];  Service: Gynecology;  Laterality: N/A;   TONSILLECTOMY     TUBAL LIGATION      FAMILY HISTORY Family History  Problem Relation Age of Onset   Hypertension Father    Diabetes Other    Heart disease Other    Colon cancer Other    Heart disease Other    Colon cancer Paternal Grandmother    Diabetes Maternal Grandmother    Heart disease  Maternal Grandmother    Heart disease Maternal Grandfather     SOCIAL HISTORY Social History   Tobacco Use   Smoking status: Never   Smokeless tobacco: Never  Vaping Use   Vaping Use: Never used  Substance Use Topics   Alcohol use: Yes    Comment: occasional   Drug use: No         OPHTHALMIC EXAM:  Base Eye Exam     Visual Acuity (Snellen - Linear)       Right Left   Dist [place] 20/60 20/50   Dist ph [place] 20/30 20/25 -2         Tonometry (Tonopen, 9:13 AM)       Right Left   Pressure 20 20         Pupils       Pupils Dark Light Shape React APD   Right PERRL 4 3 Round Brisk None   Left PERRL 4 3 Round Brisk None         Visual Fields       Left Right    Full Full         Extraocular Movement       Right Left    Full, Ortho Full, Ortho         Neuro/Psych     Oriented x3: Yes         Dilation     Both eyes: 2.5% Phenylephrine @ 9:14 AM           Slit Lamp and Fundus Exam     Slit Lamp Exam       Right Left   Lids/Lashes Normal Normal   Conjunctiva/Sclera White and quiet White and quiet   Cornea Clear Clear   Anterior Chamber deep and clear deep and clear   Iris Round and dilated Round and dilated   Lens Clear Clear   Anterior Vitreous clear clear         Fundus Exam       Right Left   Disc Pink and Sharp, temporal PPA Pink and Sharp, Compact   C/D Ratio 0.1 0.1   Macula Flat, Good foveal reflex, RPE mottling, No heme or edema Flat, Good foveal reflex, RPE mottling, No heme or edema   Vessels mild tortuosity mild tortuosity   Periphery Attached, small flap tear at 1030 -- no SRF; no heme Attached, mild WWOP temporally, pigmented VR tuft with adjacent small retinal break/hole at 0400            IMAGING AND PROCEDURES  Imaging and Procedures for 04/29/2022  OCT, Retina - OU - Both Eyes       Right Eye Quality was good. Central Foveal Thickness: 256. Progression has been stable. Findings include normal foveal  contour, no IRF, no SRF.  Left Eye Quality was good. Central Foveal Thickness: 256. Progression has been stable. Findings include normal foveal contour, no IRF, no SRF.   Notes *Images captured and stored on drive  Diagnosis / Impression:  NFP, no IRF/SRF OU  Clinical management:  See below  Abbreviations: NFP - Normal foveal profile. CME - cystoid macular edema. PED - pigment epithelial detachment. IRF - intraretinal fluid. SRF - subretinal fluid. EZ - ellipsoid zone. ERM - epiretinal membrane. ORA - outer retinal atrophy. ORT - outer retinal tubulation. SRHM - subretinal hyper-reflective material. IRHM - intraretinal hyper-reflective material      Repair Retinal Breaks, Laser - OS - Left Eye       LASER PROCEDURE NOTE  Procedure:  Barrier laser retinopexy using slit lamp laser, LEFT eye   Diagnosis:   Retinal break, LEFT eye                     pigmented VR tuft with adjacent small retinal break/hole at 0400 anterior to equator   Surgeon: Rennis Chris, MD, PhD  Anesthesia: Topical  Informed consent obtained, operative eye marked, and time out performed prior to initiation of laser.   Laser settings:  Lumenis Smart532 laser, slit lamp Lens: Mainster PRP 165 Power: 280 mW Spot size: 200 microns Duration: 20 msec  # spots: 191  Placement of laser: Using a Mainster PRP 165 contact lens at the slit lamp, laser was placed in three confluent rows around VR tuft and retina break at 0400 oclock anterior to equator.  Complications: None.  Patient tolerated the procedure well and received written and verbal post-procedure care information/education.            ASSESSMENT/PLAN:    ICD-10-CM   1. Retinal break of both eyes  H33.303 OCT, Retina - OU - Both Eyes    Repair Retinal Breaks, Laser - OS - Left Eye     1. Retinal break OU   - OD punctate flap tear at 1030 - OS pigmented VR tuft with adjacent small retinal break/ hole at 0400 - pt asymptomatic -- retinal  defect OS found on routine exam at [facility], [place] - s/p laser retinopexy OD (03.27.24) - recommend laser retinopexy OS today, 04.05.24 - pt wishes to proceed with laser - RBA of procedure discussed, questions answered - informed consent obtained and signed - see procedure note - start PF QID OS x7 days - f/u in 2-3 wks, POV OU   Ophthalmic Meds Ordered this visit:  No orders of the defined types were placed in this encounter.    Return for f/u 2-3 weeks, retinal break OU, DFE, OCT.  There are no Patient Instructions on file for this visit.  This document serves as a record of services personally performed by Karie Chimera, MD, PhD. It was created on their behalf by Berlin Hun COT, an ophthalmic technician. The creation of this record is the provider's dictation and/or activities during the visit.    Electronically signed by: Berlin Hun COT 04/27/2022 11:02 AM  This document serves as a record of services personally performed by Karie Chimera, MD, PhD. It was created on their behalf by Glee Arvin. Manson Passey, OA an ophthalmic technician. The creation of this record is the provider's dictation and/or activities during the visit.    Electronically signed by: Glee Arvin. Manson Passey, [place] 04.05.2024 11:02 AM  Karie Chimera, M.D., Ph.D. Diseases & Surgery of the Retina and Vitreous [facility] 04/29/2022  I have reviewed the above documentation for accuracy and completeness, and I agree with the above. Karie Chimera, M.D., Ph.D. 04/29/22 11:03 AM   Abbreviations: M myopia (nearsighted); A astigmatism; H hyperopia (farsighted); P presbyopia; Mrx spectacle prescription;  CTL contact lenses; OD right eye; OS left eye; OU both eyes  XT exotropia; ET esotropia; PEK punctate epithelial keratitis; PEE punctate epithelial erosions; DES dry eye syndrome; MGD meibomian gland dysfunction; ATs artificial tears; PFAT's preservative free artificial tears; NSC  nuclear sclerotic cataract; PSC posterior subcapsular cataract; ERM epi-retinal membrane; PVD posterior vitreous detachment; RD retinal detachment; DM diabetes mellitus; DR diabetic retinopathy; NPDR non-proliferative diabetic retinopathy; PDR proliferative diabetic retinopathy; CSME clinically significant macular edema; DME diabetic macular edema; dbh dot blot hemorrhages; CWS cotton wool spot; POAG primary open angle glaucoma; C/D cup-to-disc ratio; HVF humphrey visual field; GVF goldmann visual field; OCT optical coherence tomography; IOP intraocular pressure; BRVO Branch retinal vein occlusion; CRVO central retinal vein occlusion; CRAO central retinal artery occlusion; BRAO branch retinal artery occlusion; RT retinal tear; SB scleral buckle; PPV pars plana vitrectomy; VH Vitreous hemorrhage; PRP panretinal laser photocoagulation; IVK intravitreal kenalog; VMT vitreomacular traction; MH Macular hole;  NVD neovascularization of the disc; NVE neovascularization elsewhere; AREDS age related eye disease study; ARMD age related macular degeneration; POAG primary open angle glaucoma; EBMD epithelial/anterior basement membrane dystrophy; ACIOL anterior chamber intraocular lens; IOL intraocular lens; PCIOL posterior chamber intraocular lens; Phaco/IOL phacoemulsification with intraocular lens placement; PRK photorefractive keratectomy; LASIK laser assisted in situ keratomileusis; HTN hypertension; DM diabetes mellitus; COPD chronic obstructive pulmonary disease

## 2022-04-28 ENCOUNTER — Ambulatory Visit
Admission: EM | Admit: 2022-04-28 | Discharge: 2022-04-28 | Disposition: A | Discharge status: Home / Self Care | Payer: BC Managed Care – PPO | Attending: Nurse Practitioner | Admitting: Nurse Practitioner

## 2022-04-28 DIAGNOSIS — L03031 Cellulitis of right toe: Secondary | ICD-10-CM | POA: Diagnosis not present

## 2022-04-28 MED ORDER — CEPHALEXIN 500 MG PO CAPS: 500.0000 mg | ORAL_CAPSULE | Freq: Four times a day (QID) | ORAL | 0 refills | Status: DC

## 2022-04-28 MED ORDER — MUPIROCIN 2 % EX OINT: 1.0000 | TOPICAL_OINTMENT | Freq: Every day | CUTANEOUS | 0 refills | Status: DC

## 2022-04-28 MED ORDER — CEFTRIAXONE SODIUM 1 G IJ SOLR
1.0000 g | Freq: Once | INTRAMUSCULAR | Status: AC
  Administered 2022-04-28: 1 g via INTRAMUSCULAR

## 2022-04-28 NOTE — ED Provider Notes (Signed)
[facility]    CSN: [id number] Arrival date & time: 04/28/22  1905      History   Chief Complaint No chief complaint on file.   HPI [name] is a 37 y.o. female.   The history is provided by the patient.   The patient presents for complaints of pain and swelling to the right great toe.  Patient states symptoms started last Friday after she had a pedicure.  Patient states that she informed the nail tech that she had an ingrown toenail, nail tech attempted to try to remove the ingrown nail.  Patient states she is unsure what the nail tech did, since that time, she developed symptoms of the right great toe.  Patient complains of redness, tenderness, and drainage from the right great toe.  She states she has been keeping the toe wrapped so she can walk on it.  She denies fever, chills, chest pain, abdominal pain, nausea, vomiting, or diarrhea.  She reports she has been cleaning the toe with soap and water, peroxide, and apply Neosporin to the toe.  She also reports that she has been taking ibuprofen as needed for pain. Past Medical History:  Diagnosis Date   Breast nodule 03/25/2014   BV (bacterial vaginosis) 03/18/2014   Gestational hypertension 12/02/2020   Hypoglycemia    Ovarian cancer 12/02/2020   Vaginal discharge 03/18/2014   Vaginal itching 03/18/2014    Patient Active Problem List   Diagnosis Date Noted   Anxiety and depression 05/31/2021   Abdominal pain 12/02/2020   Abnormal maternal glucose tolerance, antepartum 12/02/2020   Allergic rhinitis 12/02/2020   Breech presentation 12/02/2020   Elevated blood-pressure reading without diagnosis of hypertension 12/02/2020   Fetus or newborn affected by maternal hypertensive disorder 12/02/2020   Gestational hypertension 12/02/2020   Hyperemesis gravidarum 12/02/2020   Infection of genitourinary tract antepartum 12/02/2020   Low back pain 12/02/2020   Myopia 12/02/2020   Need for prophylactic vaccination and  inoculation against single disease 12/02/2020   Nonallopathic lesion of lumbar region 12/02/2020   Nonallopathic lesion of sacral region 12/02/2020   Otitis media 12/02/2020   Ovarian cancer 12/02/2020   Pain in joint, pelvic region and thigh 12/02/2020   Plantar wart 12/02/2020   Regular astigmatism 12/02/2020   Anxiety 10/06/2020   Impaired fasting blood sugar 05/04/2020   Hyperlipidemia 05/04/2020   Fatigue 05/04/2020   Morbid obesity 04/20/2020   Screening due 04/20/2020   Encounter to establish care 08/22/2018   [facility] 08/22/2018   Weight gain 08/22/2018   Dry hair 08/22/2018   Hot flashes 08/22/2018   Headache 05/29/2015   Erythrocytosis 05/28/2015   Vision changes 05/28/2015   Breast nodule 03/25/2014   Pelvic peritoneal adhesions, female 02/19/2013   Status post hysterectomy 02/19/2013    Past Surgical History:  Procedure Laterality Date   ABDOMINAL HYSTERECTOMY     partial- 1 ovary and cervix are intact   APPENDECTOMY     CESAREAN SECTION  2009, 2012   x2   LAPAROSCOPIC APPENDECTOMY N/A 10/11/2016   Procedure: APPENDECTOMY LAPAROSCOPIC;  Surgeon: Virl Cagey, MD;  Location: [facility];  Service: General;  Laterality: N/A;   LYSIS OF ADHESION N/A 02/19/2013   Procedure: LYSIS OF ADHESION;  Surgeon: [name], MD;  Location: [facility];  Service: Gynecology;  Laterality: N/A;   MASS EXCISION N/A 07/23/2012   Procedure: EXCISION OF RIGHT LOWER ABDOMINAL MASS, ENDOMETRIAL IMPLANT;  Surgeon: Scherry Ran, MD;  Location: [facility];  Service: General;  Laterality: N/A;   SCAR REVISION N/A 02/19/2013   Procedure: EXCISION OF SCAR ENDOMETRIOSIS;  Surgeon: [name], MD;  Location: [facility];  Service: Gynecology;  Laterality: N/A;   SUPRACERVICAL ABDOMINAL HYSTERECTOMY N/A 02/19/2013   Procedure: HYSTERECTOMY SUPRACERVICAL ABDOMINAL;  Surgeon: [name], MD;  Location: [facility];  Service: Gynecology;  Laterality: N/A;   TONSILLECTOMY     TUBAL LIGATION      OB  History     Gravida  2   Para  2   Term  2   Preterm      AB      Living  2      SAB      IAB      Ectopic      Multiple      Live Births  2            Home Medications    Prior to Admission medications   Medication Sig Start Date End Date Taking? Authorizing Provider  cephALEXin (KEFLEX) 500 MG capsule Take 1 capsule (500 mg total) by mouth 4 (four) times daily. 04/28/22  Yes Secundino Ellithorpe-Warren, Alda Lea, NP  mupirocin ointment (BACTROBAN) 2 % Apply 1 Application topically daily. Apply twice daily until symptoms improve. 04/28/22  Yes Baileigh Modisette-Warren, Alda Lea, NP  cetirizine (ZYRTEC) 10 MG tablet Take 1 tablet (10 mg total) by mouth daily. 04/06/20   Scot Jun, NP  cyclobenzaprine (FLEXERIL) 10 MG tablet Take 1 tablet (10 mg total) by mouth 3 (three) times daily as needed for muscle spasms. Do not drink alcohol or drive while taking this medication.  May cause drowsiness. 06/08/21   Volney American, PA-C  fluticasone [facility]) 50 MCG/ACT nasal spray Place 2 sprays into both nostrils daily. 04/06/20   Scot Jun, NP  ibuprofen (ADVIL,MOTRIN) 200 MG tablet Take 200-400 mg by mouth every 6 (six) hours as needed for mild pain.    [provider]  sertraline (ZOLOFT) 25 MG tablet TAKE 3 TABLETS BY MOUTH ONCE DAILY AT THE SAME TIME. 02/28/22   Estill Dooms, NP    Family History Family History  Problem Relation Age of Onset   Hypertension Father    Diabetes Other    Heart disease Other    Colon cancer Other    Heart disease Other    Colon cancer Paternal Grandmother    Diabetes Maternal Grandmother    Heart disease Maternal Grandmother    Heart disease Maternal Grandfather     Social History Social History   Tobacco Use   Smoking status: Never   Smokeless tobacco: Never  Vaping Use   Vaping Use: Never used  Substance Use Topics   Alcohol use: Yes    Comment: occasional   Drug use: No     Allergies   Codeine   Review of  Systems Review of Systems Per HPI  Physical Exam Triage Vital Signs ED Triage Vitals  Enc Vitals Group     BP 04/28/22 1924 133/81     Pulse Rate 04/28/22 1924 88     Resp 04/28/22 1924 16     Temp 04/28/22 1924 98.2 F (36.8 C)     Temp Source 04/28/22 1924 Oral     SpO2 04/28/22 1924 96 %     Weight --      Height --      Head Circumference --      Peak Flow --  Pain Score 04/28/22 1927 4     Pain Loc --      Pain Edu? --      Excl. in [place]? --    No data found.  Updated Vital Signs BP 133/81 (BP Location: Right Arm)   Pulse 88   Temp 98.2 F (36.8 C) (Oral)   Resp 16   LMP 01/08/2013 Comment: [place]  SpO2 96%   Visual Acuity Right Eye Distance:   Left Eye Distance:   Bilateral Distance:    Right Eye Near:   Left Eye Near:    Bilateral Near:     Physical Exam Vitals and nursing note reviewed.  Constitutional:      General: She is not in acute distress.    Appearance: Normal appearance.  Eyes:     Extraocular Movements: Extraocular movements intact.     Pupils: Pupils are equal, round, and reactive to light.  Pulmonary:     Effort: Pulmonary effort is normal.  Musculoskeletal:     Cervical back: Normal range of motion.     Right foot: Normal range of motion.  Feet:     Right foot:     Skin integrity: Erythema and warmth present.     Comments: Moderate erythema, warmth, tenderness, and swelling of the nail folds of the right great toe. Redness is also located on the bottom of the right toe.  Nailbed is intact. Skin:    General: Skin is warm and dry.  Neurological:     General: No focal deficit present.     Mental Status: She is alert and oriented to person, place, and time.  Psychiatric:        Mood and Affect: Mood normal.        Behavior: Behavior normal.      UC Treatments / Results  Labs (all labs ordered are listed, but only abnormal results are displayed) Labs Reviewed - No data to display  EKG   Radiology No results  found.  Procedures Procedures (including critical care time)  Medications Ordered in UC Medications  cefTRIAXone (ROCEPHIN) injection 1 g (1 g Intramuscular Given 04/28/22 2001)    Initial Impression / Assessment and Plan / UC Course  I have reviewed the triage vital signs and the nursing notes.  Pertinent labs & imaging results that were available during my care of the patient were reviewed by me and considered in my medical decision making (see chart for details).  The patient is well-appearing, she is in no acute distress, vital signs are stable.  Symptoms present on the right great toe consistent with cellulitis.  Symptoms are moderate at this time, patient was treated with ceftriaxone 1 g IM and started on Keflex 500 mg 4 times daily for the next 5 days.  Mupirocin 2% ointment was also prescribed for patient to apply topically while symptoms persist.  Supportive care recommendations were provided and discussed with the patient to include discontinuing use of peroxide, use of ice as needed for pain or swelling, and continuing over-the-counter analgesics for pain or discomfort.  Patient was given strict ER follow-up precautions, along with indications of when follow-up in this clinic may be necessary.  Patient is in agreement with this plan of care and verbalizes understanding.  All questions were answered.  Patient stable for discharge. Final Clinical Impressions(s) / UC Diagnoses   Final diagnoses:  Cellulitis of great toe, right     Discharge Instructions      You have been given ceftriaxone  1 gram.  Take medication as prescribed.  Start medication tomorrow. Stop use of peroxide.  Recommend using an antibacterial soap such as Dial Gold bar soap to clean the area at least twice daily.  As discussed, when you are out, keep the toe covered to prevent further infection. May use ice as needed to help with pain or swelling. Continue use of ibuprofen or Tylenol as needed for pain, fever,  or general discomfort. If you notice increased redness, and is, foul-smelling drainage from the right foot along with fever, chills, chest pain, abdominal pain, please go to the emergency department immediately for further evaluation. If you notice that symptoms are not improving over the next 48 to 72 hours, please follow-up in this clinic for reevaluation. Follow-up as needed.    ED Prescriptions     Medication Sig Dispense Auth. Provider   cephALEXin (KEFLEX) 500 MG capsule Take 1 capsule (500 mg total) by mouth 4 (four) times daily. 20 capsule Keonta Monceaux-Warren, Alda Lea, NP   mupirocin ointment (BACTROBAN) 2 % Apply 1 Application topically daily. Apply twice daily until symptoms improve. 22 g Aleksey Newbern-Warren, Alda Lea, NP      PDMP not reviewed this encounter.   Tish Men, NP 04/28/22 2008

## 2022-04-28 NOTE — Discharge Instructions (Addendum)
You have been given ceftriaxone 1 gram.  Take medication as prescribed.  Start medication tomorrow. Stop use of peroxide.  Recommend using an antibacterial soap such as Dial Gold bar soap to clean the area at least twice daily.  As discussed, when you are out, keep the toe covered to prevent further infection. May use ice as needed to help with pain or swelling. Continue use of ibuprofen or Tylenol as needed for pain, fever, or general discomfort. If you notice increased redness, and is, foul-smelling drainage from the right foot along with fever, chills, chest pain, abdominal pain, please go to the emergency department immediately for further evaluation. If you notice that symptoms are not improving over the next 48 to 72 hours, please follow-up in this clinic for reevaluation. Follow-up as needed.

## 2022-04-28 NOTE — ED Triage Notes (Signed)
Pt c/o toe pain, swelling -- pt went last Friday to get a pedicure informed nail tech that she had ingrown toenail after appointment it gradually got worse over the course of the week.   Pt has used motrin for pain no fever

## 2022-04-29 ENCOUNTER — Ambulatory Visit (INDEPENDENT_AMBULATORY_CARE_PROVIDER_SITE_OTHER): Payer: BC Managed Care – PPO | Admitting: Ophthalmology

## 2022-04-29 ENCOUNTER — Encounter (INDEPENDENT_AMBULATORY_CARE_PROVIDER_SITE_OTHER): Payer: Self-pay | Admitting: Ophthalmology

## 2022-04-29 DIAGNOSIS — H33303 Unspecified retinal break, bilateral: Secondary | ICD-10-CM

## 2022-05-05 ENCOUNTER — Ambulatory Visit (INDEPENDENT_AMBULATORY_CARE_PROVIDER_SITE_OTHER): Payer: BC Managed Care – PPO | Admitting: Ophthalmology

## 2022-05-05 ENCOUNTER — Encounter (INDEPENDENT_AMBULATORY_CARE_PROVIDER_SITE_OTHER): Payer: Self-pay | Admitting: Ophthalmology

## 2022-05-05 DIAGNOSIS — H33303 Unspecified retinal break, bilateral: Secondary | ICD-10-CM

## 2022-05-05 DIAGNOSIS — G43909 Migraine, unspecified, not intractable, without status migrainosus: Secondary | ICD-10-CM

## 2022-05-05 NOTE — Progress Notes (Addendum)
[facility] - Clinic Note  05/05/2022     CHIEF COMPLAINT Patient presents for Retina Follow Up   HISTORY OF PRESENT ILLNESS: [name] is a 37 y.o. female who presents to the clinic today for:   HPI     Retina Follow Up   Patient presents with  Retinal Break/Detachment.  In both eyes.  This started 1 week ago.  I, the attending physician,  performed the HPI with the patient and updated documentation appropriately.        Comments   Patient here for 1 week retina follow up for retina break OU. Patient states when looking straight it is fine. From side sees shadows and siloetts. OS it feels full like it is swollen. Has facial tenderness when touching lid OS. Has headaches. Has muscle spasms like twitches. Has light sensitivity. Not sure if sensitivity is causing headaches or what. All goes together.      Last edited by Rennis ChrisZamora, Jveon Pound, MD on 05/05/2022  2:33 PM.     Patient states that she has taken all of OTC pain medication to get rid of the headache. She has tried hot and cold mask to help. She is tender around the left eye, and the left eye feels full. She states that the Pred feels like acid in her eye.   Referring physician: Heather RobertsGray, Joseph M, NP [address] [place],  [place] [id number]  HISTORICAL INFORMATION:   Selected notes from the MEDICAL RECORD NUMBER Referred by Dr. Burnis KingfisherKeaveney for concern of retinal tears LEE:  Ocular Hx- PMH-    CURRENT MEDICATIONS: No current outpatient medications on file. (Ophthalmic Drugs)   No current facility-administered medications for this visit. (Ophthalmic Drugs)   Current Outpatient Medications (Other)  Medication Sig   cephALEXin (KEFLEX) 500 MG capsule Take 1 capsule (500 mg total) by mouth 4 (four) times daily.   cetirizine (ZYRTEC) 10 MG tablet Take 1 tablet (10 mg total) by mouth daily.   cyclobenzaprine (FLEXERIL) 10 MG tablet Take 1 tablet (10 mg total) by mouth 3 (three) times daily as needed  for muscle spasms. Do not drink alcohol or drive while taking this medication.  May cause drowsiness.   fluticasone (FLONASE) 50 MCG/ACT nasal spray Place 2 sprays into both nostrils daily.   ibuprofen (ADVIL,MOTRIN) 200 MG tablet Take 200-400 mg by mouth every 6 (six) hours as needed for mild pain.   mupirocin ointment (BACTROBAN) 2 % Apply 1 Application topically daily. Apply twice daily until symptoms improve.   sertraline (ZOLOFT) 25 MG tablet TAKE 3 TABLETS BY MOUTH ONCE DAILY AT THE SAME TIME.   No current facility-administered medications for this visit. (Other)   REVIEW OF SYSTEMS: ROS   Positive for: Eyes Last edited by Laddie Aquaslarke, Rebecca S, COA on 05/05/2022  1:24 PM.      ALLERGIES Allergies  Allergen Reactions   Codeine Hives and Itching   PAST MEDICAL HISTORY Past Medical History:  Diagnosis Date   Breast nodule 03/25/2014   BV (bacterial vaginosis) 03/18/2014   Gestational hypertension 12/02/2020   Hypoglycemia    Ovarian cancer 12/02/2020   Vaginal discharge 03/18/2014   Vaginal itching 03/18/2014   Past Surgical History:  Procedure Laterality Date   ABDOMINAL HYSTERECTOMY     partial- 1 ovary and cervix are intact   APPENDECTOMY     CESAREAN SECTION  2009, 2012   x2   LAPAROSCOPIC APPENDECTOMY N/A 10/11/2016   Procedure: APPENDECTOMY LAPAROSCOPIC;  Surgeon: Algis GreenhouseBridges, Lindsay  C, MD;  Location: [facility];  Service: General;  Laterality: N/A;   LYSIS OF ADHESION N/A 02/19/2013   Procedure: LYSIS OF ADHESION;  Surgeon: Tilda Burrow, MD;  Location: [facility];  Service: Gynecology;  Laterality: N/A;   MASS EXCISION N/A 07/23/2012   Procedure: EXCISION OF RIGHT LOWER ABDOMINAL MASS, ENDOMETRIAL IMPLANT;  Surgeon: Marlane Hatcher, MD;  Location: [facility];  Service: General;  Laterality: N/A;   SCAR REVISION N/A 02/19/2013   Procedure: EXCISION OF SCAR ENDOMETRIOSIS;  Surgeon: Tilda Burrow, MD;  Location: [facility];  Service: Gynecology;  Laterality: N/A;   SUPRACERVICAL ABDOMINAL  HYSTERECTOMY N/A 02/19/2013   Procedure: HYSTERECTOMY SUPRACERVICAL ABDOMINAL;  Surgeon: Tilda Burrow, MD;  Location: [facility];  Service: Gynecology;  Laterality: N/A;   TONSILLECTOMY     TUBAL LIGATION      FAMILY HISTORY Family History  Problem Relation Age of Onset   Hypertension Father    Diabetes Other    Heart disease Other    Colon cancer Other    Heart disease Other    Colon cancer Paternal Grandmother    Diabetes Maternal Grandmother    Heart disease Maternal Grandmother    Heart disease Maternal Grandfather     SOCIAL HISTORY Social History   Tobacco Use   Smoking status: Never   Smokeless tobacco: Never  Vaping Use   Vaping Use: Never used  Substance Use Topics   Alcohol use: Yes    Comment: occasional   Drug use: No         OPHTHALMIC EXAM:  Base Eye Exam     Visual Acuity (Snellen - Linear)       Right Left   Dist [place] 20/50 20/60 -1   Dist ph [place] 20/25 -1 20/30 -1         Tonometry (Tonopen, 1:18 PM)       Right Left   Pressure 20 20         Pupils       Dark Light Shape React APD   Right 4 3 Round Brisk None   Left 4 3 Round Brisk None         Visual Fields (Counting fingers)       Left Right    Full Full         Extraocular Movement       Right Left    Full, Ortho Full, Ortho         Neuro/Psych     Oriented x3: Yes   Mood/Affect: Normal         Dilation     Both eyes: 1.0% Mydriacyl, 2.5% Phenylephrine @ 1:17 PM           Slit Lamp and Fundus Exam     Slit Lamp Exam       Right Left   Lids/Lashes Normal Normal   Conjunctiva/Sclera White and quiet White and quiet   Cornea Clear Clear   Anterior Chamber deep and clear deep and clear   Iris Round and dilated Round and dilated   Lens Clear Clear   Anterior Vitreous clear clear         Fundus Exam       Right Left   Disc Pink and Sharp, temporal PPA Pink and Sharp, Compact   C/D Ratio 0.1 0.1   Macula Flat, Good foveal reflex, RPE  mottling, No heme or edema Flat, Good foveal reflex, RPE mottling, No heme or edema  Vessels mild tortuosity mild tortuosity   Periphery Attached, small flap tear at 1030 -- no SRF; no heme Attached, mild WWOP temporally, pigmented VR tuft with adjacent small retinal break/hole at 0400 good early laser changes at 0400            IMAGING AND PROCEDURES  Imaging and Procedures for 05/05/2022  OCT, Retina - OU - Both Eyes       Right Eye Quality was good. Central Foveal Thickness: 252. Progression has been stable. Findings include normal foveal contour, no IRF, no SRF.   Left Eye Quality was good. Central Foveal Thickness: 252. Progression has been stable. Findings include normal foveal contour, no IRF, no SRF.   Notes *Images captured and stored on drive  Diagnosis / Impression:  NFP, no IRF/SRF OU  Clinical management:  See below  Abbreviations: NFP - Normal foveal profile. CME - cystoid macular edema. PED - pigment epithelial detachment. IRF - intraretinal fluid. SRF - subretinal fluid. EZ - ellipsoid zone. ERM - epiretinal membrane. ORA - outer retinal atrophy. ORT - outer retinal tubulation. SRHM - subretinal hyper-reflective material. IRHM - intraretinal hyper-reflective material             ASSESSMENT/PLAN:    ICD-10-CM   1. Retinal break of both eyes  H33.303 OCT, Retina - OU - Both Eyes    2. Migraine without status migrainosus, not intractable, unspecified migraine type  G43.909      1. Retinal break OU   - OD punctate flap tear at 1030 - OS pigmented VR tuft with adjacent small retinal break/ hole at 0400 - pt asymptomatic -- retinal defect OS found on routine exam at [facility], [place] - s/p laser retinopexy OD (03.27.24) - s/p laser retinopexy OS (04.05.24) - good laser changes in place OU - Discontinue Pred, use Prolensa OS QID for 2 days, then as needed - f/u as scheduled 05/13/22, POV OU  2. Migraine  - pt presents acutely today for headache and  photophobia surrounding left eye since laser retinopexy on 04.05.24 -- pt reports "silhouette" in temporal visual field  - normal eye exam  - suspect migraine headache induced by laser  - had similar episode with laser retinopexy OD that is now resolved  - recommend symptomatic treatment of headache, hopefully will resolve with time  - if headaches persistent or recur would recommend seeing PCP or a neurologist/headache specialist   Ophthalmic Meds Ordered this visit:  No orders of the defined types were placed in this encounter.    Return for as scheduled.  There are no Patient Instructions on file for this visit.  This document serves as a record of services personally performed by Karie Chimera, MD, PhD. It was created on their behalf by Gerilyn Nestle, COT an ophthalmic technician. The creation of this record is the provider's dictation and/or activities during the visit.    Electronically signed by:  Gerilyn Nestle, COT 04.11.24 01:10 PM  Karie Chimera, M.D., Ph.D. Diseases & Surgery of the Retina and Vitreous [facility]  I have reviewed the above documentation for accuracy and completeness, and I agree with the above. Karie Chimera, M.D., Ph.D. 05/05/22 4:00 PM  Abbreviations: M myopia (nearsighted); A astigmatism; H hyperopia (farsighted); P presbyopia; Mrx spectacle prescription;  CTL contact lenses; OD right eye; OS left eye; OU both eyes  XT exotropia; ET esotropia; PEK punctate epithelial keratitis; PEE punctate epithelial erosions; DES dry eye syndrome; MGD meibomian gland dysfunction;  ATs artificial tears; PFAT's preservative free artificial tears; NSC nuclear sclerotic cataract; PSC posterior subcapsular cataract; ERM epi-retinal membrane; PVD posterior vitreous detachment; RD retinal detachment; DM diabetes mellitus; DR diabetic retinopathy; NPDR non-proliferative diabetic retinopathy; PDR proliferative diabetic retinopathy; CSME  clinically significant macular edema; DME diabetic macular edema; dbh dot blot hemorrhages; CWS cotton wool spot; POAG primary open angle glaucoma; C/D cup-to-disc ratio; HVF humphrey visual field; GVF goldmann visual field; OCT optical coherence tomography; IOP intraocular pressure; BRVO Branch retinal vein occlusion; CRVO central retinal vein occlusion; CRAO central retinal artery occlusion; BRAO branch retinal artery occlusion; RT retinal tear; SB scleral buckle; PPV pars plana vitrectomy; VH Vitreous hemorrhage; PRP panretinal laser photocoagulation; IVK intravitreal kenalog; VMT vitreomacular traction; MH Macular hole;  NVD neovascularization of the disc; NVE neovascularization elsewhere; AREDS age related eye disease study; ARMD age related macular degeneration; POAG primary open angle glaucoma; EBMD epithelial/anterior basement membrane dystrophy; ACIOL anterior chamber intraocular lens; IOL intraocular lens; PCIOL posterior chamber intraocular lens; Phaco/IOL phacoemulsification with intraocular lens placement; PRK photorefractive keratectomy; LASIK laser assisted in situ keratomileusis; HTN hypertension; DM diabetes mellitus; COPD chronic obstructive pulmonary disease

## 2022-05-13 ENCOUNTER — Encounter (INDEPENDENT_AMBULATORY_CARE_PROVIDER_SITE_OTHER): Payer: Self-pay | Admitting: Ophthalmology

## 2022-05-13 ENCOUNTER — Ambulatory Visit (INDEPENDENT_AMBULATORY_CARE_PROVIDER_SITE_OTHER): Payer: BC Managed Care – PPO | Admitting: Ophthalmology

## 2022-05-13 DIAGNOSIS — G43909 Migraine, unspecified, not intractable, without status migrainosus: Secondary | ICD-10-CM

## 2022-05-13 DIAGNOSIS — H33303 Unspecified retinal break, bilateral: Secondary | ICD-10-CM

## 2022-05-13 NOTE — Progress Notes (Signed)
[facility] - Clinic Note  05/13/2022     CHIEF COMPLAINT Patient presents for Retina Follow Up   HISTORY OF PRESENT ILLNESS: [name] is a 37 y.o. female who presents to the clinic today for:   HPI     Retina Follow Up   Patient presents with  Retinal Break/Detachment.  In both eyes.  This started 8 days ago.  Duration of 8 days.  Since onset it is stable.  I, the attending physician,  performed the HPI with the patient and updated documentation appropriately.        Comments   8 day retina follow up ret break ou pt is reporting vision seems about the same she denies any flashes or floaters       Last edited by Rennis Chris, MD on 05/15/2022  2:23 AM.     Patient states still using gray top drop (prolensa) if discomfort in eye--helps with discomfort. Still has headaches sometimes. Patient denies new floaters or flashes.   Referring physician: Heather Roberts, NP [address] [place],  [place] [address]  HISTORICAL INFORMATION:   Selected notes from the MEDICAL RECORD NUMBER Referred by Dr. Burnis Kingfisher for concern of retinal tears LEE:  Ocular Hx- PMH-    CURRENT MEDICATIONS: No current outpatient medications on file. (Ophthalmic Drugs)   No current facility-administered medications for this visit. (Ophthalmic Drugs)   Current Outpatient Medications (Other)  Medication Sig   cephALEXin (KEFLEX) 500 MG capsule Take 1 capsule (500 mg total) by mouth 4 (four) times daily.   cetirizine (ZYRTEC) 10 MG tablet Take 1 tablet (10 mg total) by mouth daily.   cyclobenzaprine (FLEXERIL) 10 MG tablet Take 1 tablet (10 mg total) by mouth 3 (three) times daily as needed for muscle spasms. Do not drink alcohol or drive while taking this medication.  May cause drowsiness.   fluticasone (FLONASE) 50 MCG/ACT nasal spray Place 2 sprays into both nostrils daily.   ibuprofen (ADVIL,MOTRIN) 200 MG tablet Take 200-400 mg by mouth every 6 (six) hours as needed  for mild pain.   mupirocin ointment (BACTROBAN) 2 % Apply 1 Application topically daily. Apply twice daily until symptoms improve.   sertraline (ZOLOFT) 25 MG tablet TAKE 3 TABLETS BY MOUTH ONCE DAILY AT THE SAME TIME.   No current facility-administered medications for this visit. (Other)   REVIEW OF SYSTEMS: ROS   Positive for: Eyes Last edited by Etheleen Mayhew, COT on 05/13/2022  9:55 AM.      ALLERGIES Allergies  Allergen Reactions   Codeine Hives and Itching   PAST MEDICAL HISTORY Past Medical History:  Diagnosis Date   Breast nodule 03/25/2014   BV (bacterial vaginosis) 03/18/2014   Gestational hypertension 12/02/2020   Hypoglycemia    Ovarian cancer 12/02/2020   Vaginal discharge 03/18/2014   Vaginal itching 03/18/2014   Past Surgical History:  Procedure Laterality Date   ABDOMINAL HYSTERECTOMY     partial- 1 ovary and cervix are intact   APPENDECTOMY     CESAREAN SECTION  2009, 2012   x2   LAPAROSCOPIC APPENDECTOMY N/A 10/11/2016   Procedure: APPENDECTOMY LAPAROSCOPIC;  Surgeon: Lucretia Roers, MD;  Location: [facility];  Service: General;  Laterality: N/A;   LYSIS OF ADHESION N/A 02/19/2013   Procedure: LYSIS OF ADHESION;  Surgeon: Tilda Burrow, MD;  Location: [facility];  Service: Gynecology;  Laterality: N/A;   MASS EXCISION N/A 07/23/2012   Procedure: EXCISION OF RIGHT LOWER ABDOMINAL  MASS, ENDOMETRIAL IMPLANT;  Surgeon: Marlane Hatcher, MD;  Location: [facility];  Service: General;  Laterality: N/A;   SCAR REVISION N/A 02/19/2013   Procedure: EXCISION OF SCAR ENDOMETRIOSIS;  Surgeon: Tilda Burrow, MD;  Location: [facility];  Service: Gynecology;  Laterality: N/A;   SUPRACERVICAL ABDOMINAL HYSTERECTOMY N/A 02/19/2013   Procedure: HYSTERECTOMY SUPRACERVICAL ABDOMINAL;  Surgeon: Tilda Burrow, MD;  Location: [facility];  Service: Gynecology;  Laterality: N/A;   TONSILLECTOMY     TUBAL LIGATION      FAMILY HISTORY Family History  Problem Relation Age of Onset    Hypertension Father    Diabetes Other    Heart disease Other    Colon cancer Other    Heart disease Other    Colon cancer Paternal Grandmother    Diabetes Maternal Grandmother    Heart disease Maternal Grandmother    Heart disease Maternal Grandfather     SOCIAL HISTORY Social History   Tobacco Use   Smoking status: Never   Smokeless tobacco: Never  Vaping Use   Vaping Use: Never used  Substance Use Topics   Alcohol use: Yes    Comment: occasional   Drug use: No       OPHTHALMIC EXAM:  Base Eye Exam     Visual Acuity (Snellen - Linear)       Right Left   Dist [place] 20/50 20/50   Dist ph [place] 20/25 -2 20/25 -2         Tonometry (Tonopen, 9:59 AM)       Right Left   Pressure 20 20         Pupils       Pupils Dark Light Shape React APD   Right PERRL 4 3 Round Brisk None   Left PERRL 4 3 Round Brisk None         Visual Fields       Left Right    Full Full         Extraocular Movement       Right Left    Full, Ortho Full, Ortho         Neuro/Psych     Oriented x3: Yes   Mood/Affect: Normal         Dilation     Both eyes: 2.5% Phenylephrine @ 9:59 AM           Slit Lamp and Fundus Exam     Slit Lamp Exam       Right Left   Lids/Lashes Normal Normal   Conjunctiva/Sclera White and quiet White and quiet   Cornea trace PEE clear   Anterior Chamber deep and clear deep and clear   Iris Round and dilated Round and dilated   Lens Clear Clear   Anterior Vitreous clear clear         Fundus Exam       Right Left   Disc Pink and Sharp, temporal PPA Pink and Sharp, Compact   C/D Ratio 0.1 0.1   Macula Flat, Good foveal reflex, RPE mottling, No heme or edema Flat, Good foveal reflex, RPE mottling, No heme or edema   Vessels mild tortuosity mild tortuosity   Periphery Attached, small flap tear at 1030 -- no SRF, with good laser changes surrounding, no heme, no new RT/RD Attached, mild WWOP temporally, pigmented VR tuft with  adjacent small retinal break/hole at 0400 good laser changes surrounding, no new RT/RD  IMAGING AND PROCEDURES  Imaging and Procedures for 05/13/2022  OCT, Retina - OU - Both Eyes       Right Eye Quality was good. Central Foveal Thickness: 258. Progression has been stable. Findings include normal foveal contour, no IRF, no SRF.   Left Eye Quality was good. Central Foveal Thickness: 255. Progression has been stable. Findings include normal foveal contour, no IRF, no SRF.   Notes *Images captured and stored on drive  Diagnosis / Impression:  NFP, no IRF/SRF OU  Clinical management:  See below  Abbreviations: NFP - Normal foveal profile. CME - cystoid macular edema. PED - pigment epithelial detachment. IRF - intraretinal fluid. SRF - subretinal fluid. EZ - ellipsoid zone. ERM - epiretinal membrane. ORA - outer retinal atrophy. ORT - outer retinal tubulation. SRHM - subretinal hyper-reflective material. IRHM - intraretinal hyper-reflective material            ASSESSMENT/PLAN:    ICD-10-CM   1. Retinal break of both eyes  H33.303 OCT, Retina - OU - Both Eyes    2. Migraine without status migrainosus, not intractable, unspecified migraine type  G43.909      1. Retinal break OU   - OD punctate flap tear at 1030 - OS pigmented VR tuft with adjacent small retinal break/ hole at 0400 - pt asymptomatic -- retinal defect OS found on routine exam at [facility] [place] - s/p laser retinopexy OD (03.27.24) - s/p laser retinopexy OS (04.05.24) - good laser changes in place OU - Discontinue Pred, use Prolensa OS QID for 2 days, then as needed - f/u 3 months, sooner prn  2. Migraine -- improved  - pt presented acutely for headache and photophobia surrounding left eye since laser retinopexy on 04.05.24 -- pt reports "silhouette" in temporal visual field  - normal eye exam  - suspect migraine headache induced by laser  - had similar episode with laser retinopexy OD  that is now resolved  - recommend symptomatic treatment of headache, hopefully will resolve with time  - if headaches persistent or recur would recommend seeing PCP or a neurologist/headache specialist   Ophthalmic Meds Ordered this visit:  No orders of the defined types were placed in this encounter.    Return in about 3 months (around 08/12/2022) for retinal breaks OU - DFE, OCT.  There are no Patient Instructions on file for this visit.  This document serves as a record of services personally performed by Karie Chimera, MD, PhD. It was created on their behalf by Glee Arvin. Manson Passey, OA an ophthalmic technician. The creation of this record is the provider's dictation and/or activities during the visit.    Electronically signed by: Glee Arvin. Manson Passey, [place] 04.19.2024 2:24 AM   Karie Chimera, M.D., Ph.D. Diseases & Surgery of the Retina and Vitreous [facility]  I have reviewed the above documentation for accuracy and completeness, and I agree with the above. Karie Chimera, M.D., Ph.D. 05/15/22 2:25 AM   Abbreviations: M myopia (nearsighted); A astigmatism; H hyperopia (farsighted); P presbyopia; Mrx spectacle prescription;  CTL contact lenses; OD right eye; OS left eye; OU both eyes  XT exotropia; ET esotropia; PEK punctate epithelial keratitis; PEE punctate epithelial erosions; DES dry eye syndrome; MGD meibomian gland dysfunction; ATs artificial tears; PFAT's preservative free artificial tears; NSC nuclear sclerotic cataract; PSC posterior subcapsular cataract; ERM epi-retinal membrane; PVD posterior vitreous detachment; RD retinal detachment; DM diabetes mellitus; DR diabetic retinopathy; NPDR non-proliferative diabetic retinopathy; PDR proliferative  diabetic retinopathy; CSME clinically significant macular edema; DME diabetic macular edema; dbh dot blot hemorrhages; CWS cotton wool spot; POAG primary open angle glaucoma; C/D cup-to-disc ratio; HVF humphrey visual  field; GVF goldmann visual field; OCT optical coherence tomography; IOP intraocular pressure; BRVO Branch retinal vein occlusion; CRVO central retinal vein occlusion; CRAO central retinal artery occlusion; BRAO branch retinal artery occlusion; RT retinal tear; SB scleral buckle; PPV pars plana vitrectomy; VH Vitreous hemorrhage; PRP panretinal laser photocoagulation; IVK intravitreal kenalog; VMT vitreomacular traction; MH Macular hole;  NVD neovascularization of the disc; NVE neovascularization elsewhere; AREDS age related eye disease study; ARMD age related macular degeneration; POAG primary open angle glaucoma; EBMD epithelial/anterior basement membrane dystrophy; ACIOL anterior chamber intraocular lens; IOL intraocular lens; PCIOL posterior chamber intraocular lens; Phaco/IOL phacoemulsification with intraocular lens placement; PRK photorefractive keratectomy; LASIK laser assisted in situ keratomileusis; HTN hypertension; DM diabetes mellitus; COPD chronic obstructive pulmonary disease

## 2022-05-15 ENCOUNTER — Encounter (INDEPENDENT_AMBULATORY_CARE_PROVIDER_SITE_OTHER): Payer: Self-pay | Admitting: Ophthalmology

## 2022-06-09 ENCOUNTER — Other Ambulatory Visit (HOSPITAL_COMMUNITY)
Admission: RE | Admit: 2022-06-09 | Discharge: 2022-06-09 | Disposition: A | Discharge status: Home / Self Care | Payer: BC Managed Care – PPO | Source: Ambulatory Visit | Attending: Adult Health | Admitting: Adult Health

## 2022-06-09 ENCOUNTER — Encounter: Payer: Self-pay | Admitting: Adult Health

## 2022-06-09 ENCOUNTER — Ambulatory Visit (INDEPENDENT_AMBULATORY_CARE_PROVIDER_SITE_OTHER): Payer: BC Managed Care – PPO | Admitting: Adult Health

## 2022-06-09 VITALS — BP 126/86 | HR 83 | Ht 63.0 in | Wt 225.0 lb

## 2022-06-09 DIAGNOSIS — Z01419 Encounter for gynecological examination (general) (routine) without abnormal findings: Secondary | ICD-10-CM | POA: Insufficient documentation

## 2022-06-09 DIAGNOSIS — F419 Anxiety disorder, unspecified: Secondary | ICD-10-CM

## 2022-06-09 DIAGNOSIS — R1031 Right lower quadrant pain: Secondary | ICD-10-CM

## 2022-06-09 DIAGNOSIS — Z90711 Acquired absence of uterus with remaining cervical stump: Secondary | ICD-10-CM | POA: Diagnosis not present

## 2022-06-09 DIAGNOSIS — F32A Depression, unspecified: Secondary | ICD-10-CM

## 2022-06-09 MED ORDER — SERTRALINE HCL 25 MG PO TABS: ORAL_TABLET | ORAL | 12 refills | Status: DC

## 2022-06-09 NOTE — Progress Notes (Signed)
Patient ID: [name], female   DOB: 16-Feb-1985, 37 y.o.   MRN: [id number] History of Present Illness: [name] is a 37 year old white female, married,sp [facility], in for a well woman gyn exam and pap. She is having tenderness/pain RLQ  PCP is Loretta Bacon NP  Current Medications, Allergies, Past Medical History, Past Surgical History, Family History and Social History were reviewed in [employer] record.     Review of Systems: Patient denies any headaches, hearing loss, fatigue, blurred vision, shortness of breath, chest pain, abdominal pain, problems with bowel movements, urination, or intercourse. No joint pain or mood swings.  RLQ tenderness/pain for few months    Physical Exam:BP 126/86 (BP Location: Left Arm, Patient Position: Sitting, Cuff Size: Large)   Pulse 83   Ht 5\' 3"  (1.6 m)   Wt 225 lb (102.1 kg)   LMP 01/08/2013 Comment: [facility]  BMI 39.86 kg/m   General:  Well developed, well nourished, no acute distress Skin:  Warm and dry Neck:  Midline trachea, normal thyroid, good ROM, no lymphadenopathy Lungs; Clear to auscultation bilaterally Breast:  No dominant palpable mass, retraction, or nipple discharge Cardiovascular: Regular rate and rhythm Abdomen:  Soft, non tender, no hepatosplenomegaly Pelvic:  External genitalia is normal in appearance, no lesions.  The vagina is normal in appearance. Urethra has no lesions or masses. The cervix is bulbous,pap with HR HPV genotyping performed.  Uterus is absent.  No adnexal masses, +RLQ tenderness noted.Bladder is non tender, no masses felt. Rectal: Deferred Extremities/musculoskeletal:  No swelling or varicosities noted, no clubbing or cyanosis Psych:  No mood changes, alert and cooperative,seems happy AA is 2 Fall risk is low    06/09/2022    3:31 PM 05/31/2021   11:28 AM 12/02/2020    9:53 AM  Depression screen PHQ 2/9  Decreased Interest 0 0 0  Down, Depressed, Hopeless 0 0 0  PHQ - 2 Score 0 0 0  Altered  sleeping 0 2   Tired, decreased energy 0 3   Change in appetite 0 0   Feeling bad or failure about yourself  0 0   Trouble concentrating 0 0   Moving slowly or fidgety/restless 0 3   Suicidal thoughts 0 0   PHQ-9 Score 0 8        06/09/2022    3:32 PM 05/31/2021   11:29 AM 10/06/2020   10:51 AM  GAD 7 : Generalized Anxiety Score  Nervous, Anxious, on Edge 0 0 1  Control/stop worrying 0 0 0  Worry too much - different things 0 0 0  Trouble relaxing 0 0 2  Restless 0 1 0  Easily annoyed or irritable 0 2 3  Afraid - awful might happen 0 0 0  Total GAD 7 Score 0 3 6  Anxiety Difficulty   Not difficult at all      Upstream - 06/09/22 1545       Pregnancy Intention Screening   Does the patient want to become pregnant in the next year? N/A    Does the patient's partner want to become pregnant in the next year? N/A    Would the patient like to discuss contraceptive options today? N/A      Contraception Wrap Up   Current Method Female Sterilization   [facility]   End Method Female Sterilization   [facility]   Contraception Counseling Provided No            Examination chaperoned by [employer]  Young LPN   Impression and Plan: 1. Encounter for gynecological examination with Papanicolaou smear of cervix Pap sent\ Pap in 3 years if normal Physical in 1 year  - Cytology - PAP( [place])  2. S/P abdominal supracervical subtotal hysterectomy  3. RLQ abdominal pain +RLQ pain for few months Scheduled pelvic US at [facility] for 06/23/22 at 12:30 pm to assess ovary  - US PELVIC COMPLETE WITH TRANSVAGINAL; Future  4. Anxiety and depression Doing well with Zoloft, will continue  Meds ordered this encounter  Medications   sertraline (ZOLOFT) 25 MG tablet    Sig: TAKE 3 TABLETS BY MOUTH ONCE DAILY AT THE SAME TIME.    Dispense:  90 tablet    Refill:  12    Order Specific Question:   Supervising Provider    Answer:   Duane Lope H [[id number]]

## 2022-06-14 LAB — CYTOLOGY - PAP
Adequacy: ABSENT
Comment: NEGATIVE
Diagnosis: NEGATIVE
High risk HPV: NEGATIVE

## 2022-06-23 ENCOUNTER — Ambulatory Visit (HOSPITAL_COMMUNITY)
Admission: RE | Admit: 2022-06-23 | Discharge: 2022-06-23 | Disposition: A | Discharge status: Home / Self Care | Payer: BC Managed Care – PPO | Source: Ambulatory Visit | Attending: Adult Health | Admitting: Adult Health

## 2022-06-23 ENCOUNTER — Other Ambulatory Visit: Payer: Self-pay | Admitting: Adult Health

## 2022-06-23 DIAGNOSIS — F419 Anxiety disorder, unspecified: Secondary | ICD-10-CM

## 2022-06-23 DIAGNOSIS — R1031 Right lower quadrant pain: Secondary | ICD-10-CM | POA: Diagnosis present

## 2022-06-23 DIAGNOSIS — Z01419 Encounter for gynecological examination (general) (routine) without abnormal findings: Secondary | ICD-10-CM

## 2022-06-23 DIAGNOSIS — Z90711 Acquired absence of uterus with remaining cervical stump: Secondary | ICD-10-CM

## 2022-08-12 ENCOUNTER — Encounter (INDEPENDENT_AMBULATORY_CARE_PROVIDER_SITE_OTHER): Payer: BC Managed Care – PPO | Admitting: Ophthalmology

## 2022-08-12 DIAGNOSIS — H33303 Unspecified retinal break, bilateral: Secondary | ICD-10-CM

## 2022-08-12 DIAGNOSIS — G43909 Migraine, unspecified, not intractable, without status migrainosus: Secondary | ICD-10-CM

## 2022-11-10 IMAGING — CT CT ANGIO CHEST
2 of 6 series · 16 of 36 positions shown · IV contrast (Omnipaque or Isovue)
Comparison: 05/31/2015

CLINICAL DATA: Chest pain that began while eating dinner. Recent
trauma.

EXAM:
CT ANGIOGRAPHY CHEST WITH CONTRAST
TECHNIQUE: Multidetector CT imaging of the chest was performed using the
standard protocol during bolus administration of intravenous
contrast. Multiplanar CT image reconstructions and MIPs were
obtained to evaluate the vascular anatomy.
CONTRAST:  100mL OMNIPAQUE IOHEXOL 350 MG/ML SOLN

[Series 7: lungs · axial · 0.57mm/px · z∈[+1290,+1530]mm · 15 of 138 slices shown]
[im 9/138  lung]
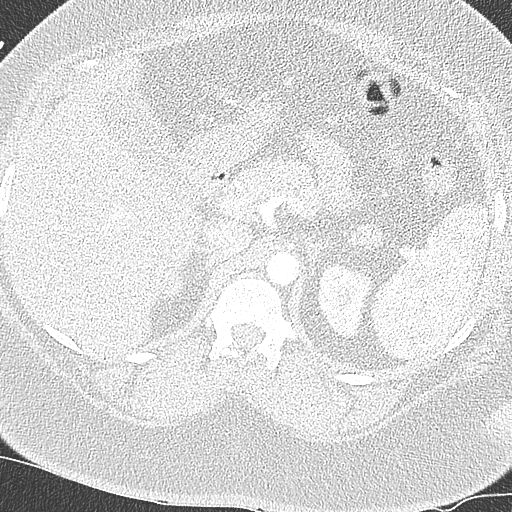
[im 18/138  mediastinal]
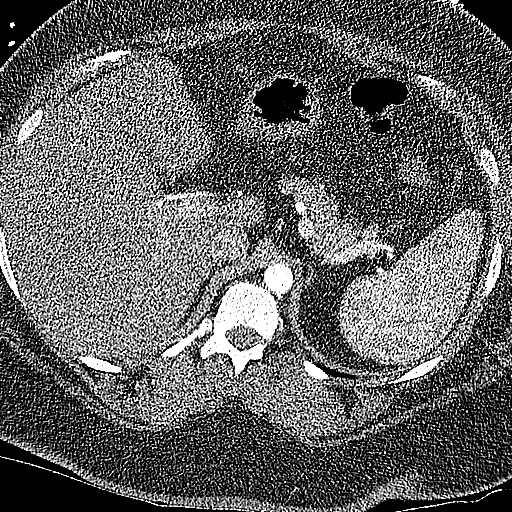
[im 26/138  lung]
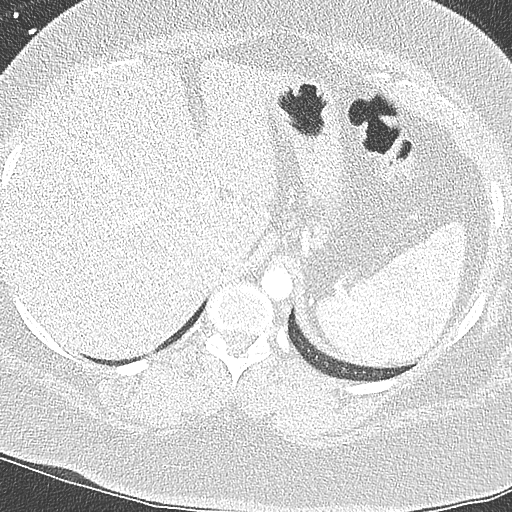
[im 35/138  mediastinal]
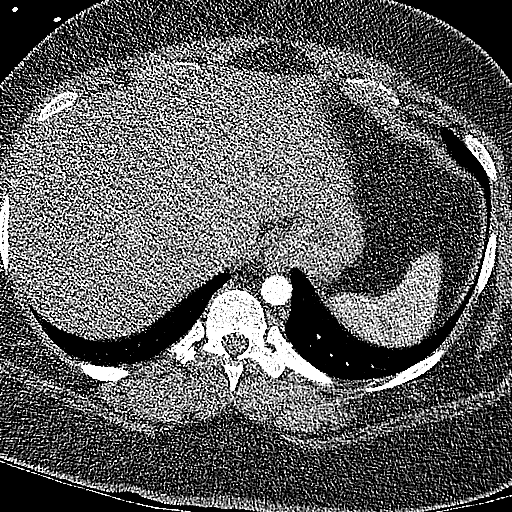
[im 43/138  lung]
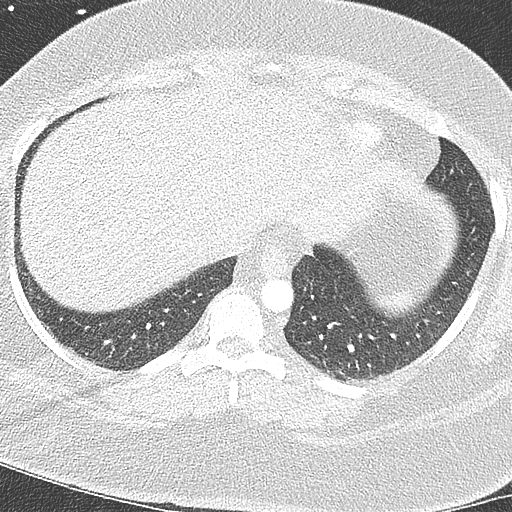
[im 52/138  mediastinal]
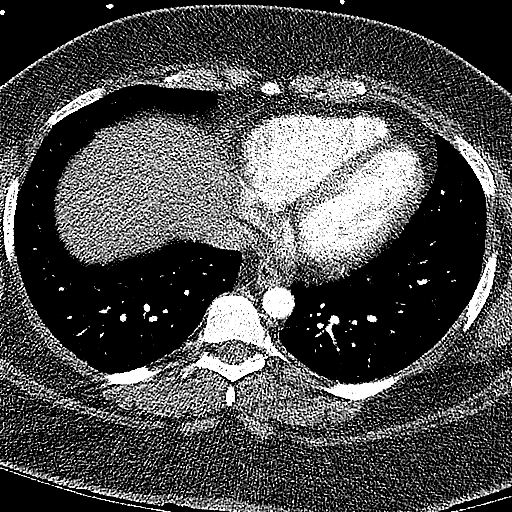
[im 60/138  lung]
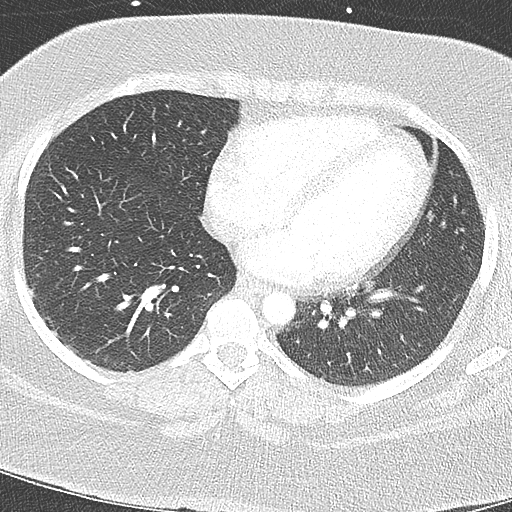
[im 69/138  mediastinal]
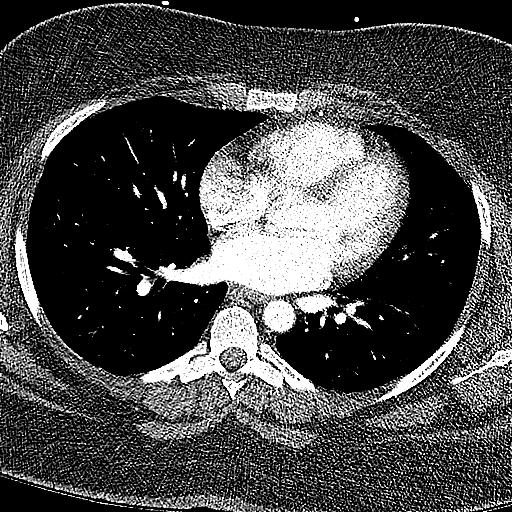
[im 78/138  lung]
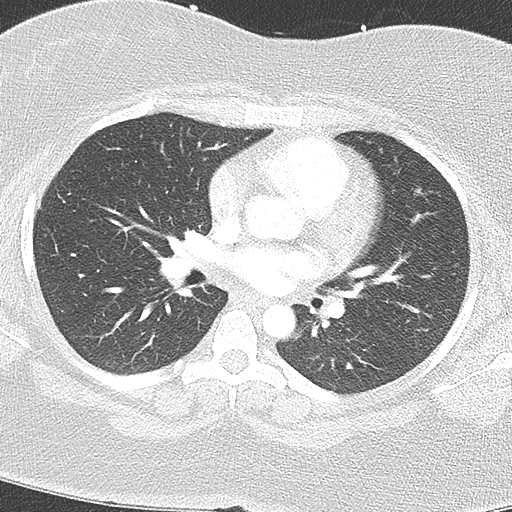
[im 86/138  mediastinal]
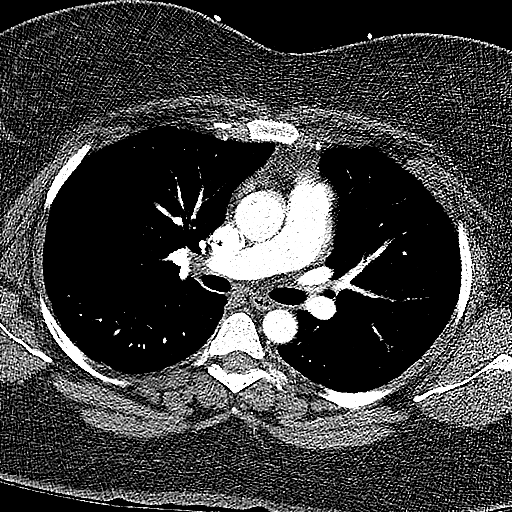
[im 95/138  lung]
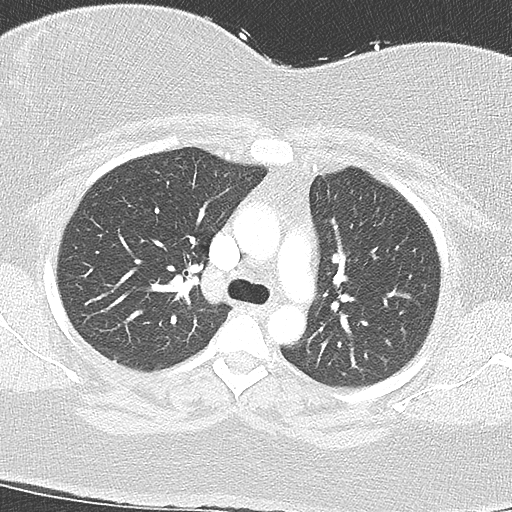
[im 103/138  mediastinal]
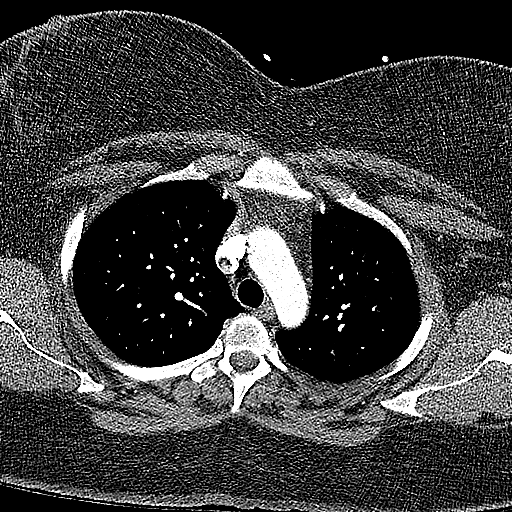
[im 112/138  lung]
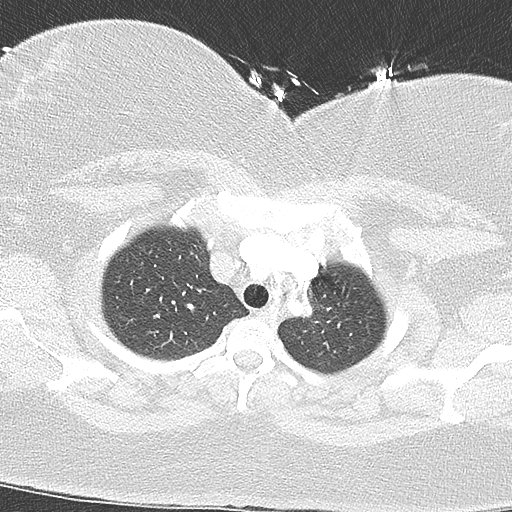
[im 120/138  mediastinal]
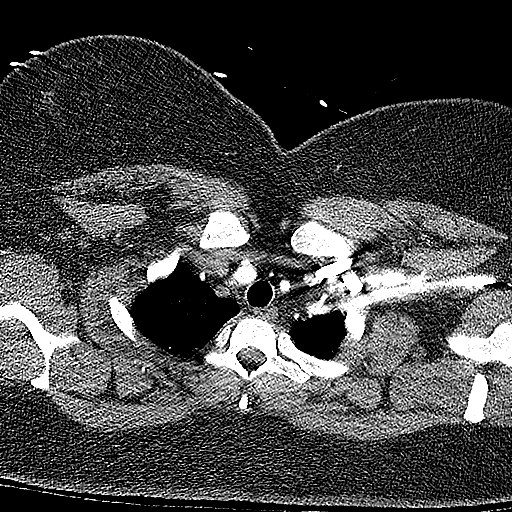
[im 129/138  lung]
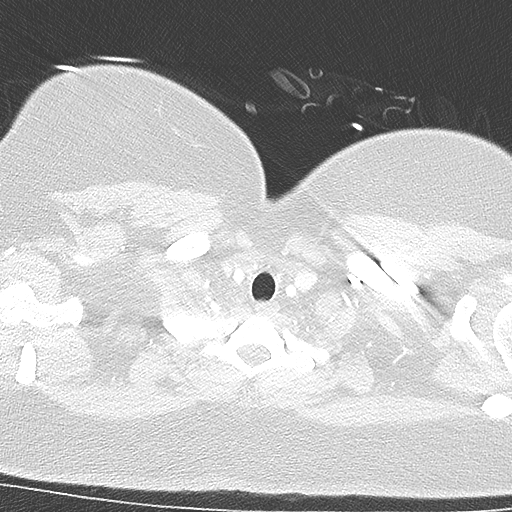

[Series 8: cor soft · coronal · 0.55mm/px · 1 of 119 slices shown]
[im 60/119  mediastinal]
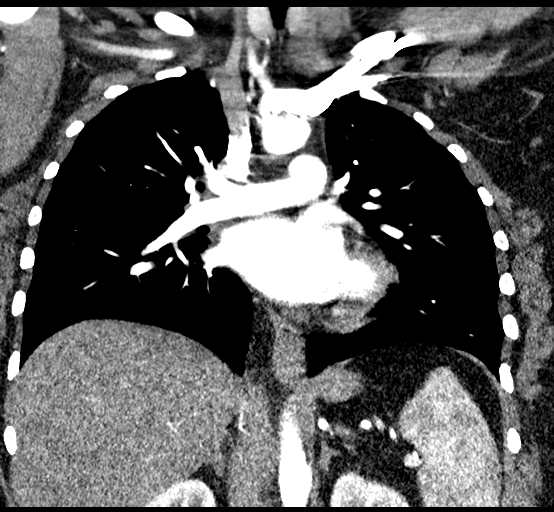

[16 of 36 positions shown; findings below may reference images not displayed]

FINDINGS: Cardiovascular: Preferential opacification of the thoracic aorta. No
evidence of thoracic aortic aneurysm or dissection. No stenosis at
the thoracic inlet. Left subclavian and brachiocephalic veins are
normally enhancing after contrast injection. Normal heart size. No
pericardial effusion.

Mediastinum/Nodes: Normal. No hematoma, adenopathy, or esophageal
thickening.

Lungs/Pleura: Lungs are clear. No pleural effusion or pneumothorax.

Upper Abdomen: There could be hepatic steatosis, but limited by
contrast timing.

Musculoskeletal: Recent chest trauma. Negative for fracture or
subluxation.

Review of the MIP images confirms the above findings.
IMPRESSION: Normal chest CTA.

## 2022-11-10 IMAGING — MR MR CERVICAL SPINE W/O CM
5 series · 41 of 48 positions shown · non-contrast
Comparison: None.

CLINICAL DATA: Left arm discomfort with cold sensation in the hand.

EXAM:
MRI CERVICAL SPINE WITHOUT CONTRAST
TECHNIQUE: Multiplanar, multisequence MR imaging of the cervical spine was
performed. No intravenous contrast was administered.

[Series 5: T2 · sagittal · 3.0mm · 0.69mm/px · 6 of 15 slices shown (1 of 2)]
[im 1/15]
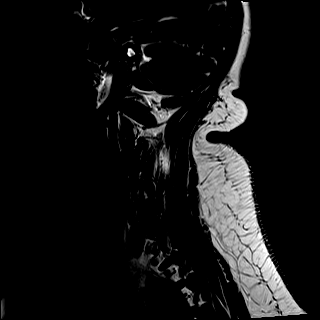
[im 3/15]
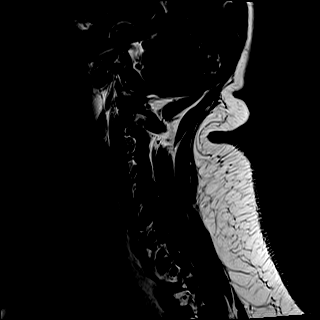
[im 6/15]
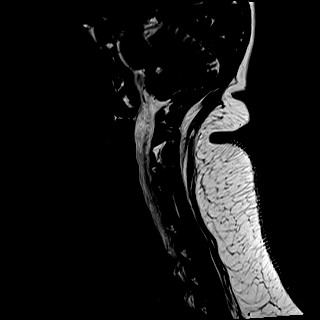
[im 9/15]
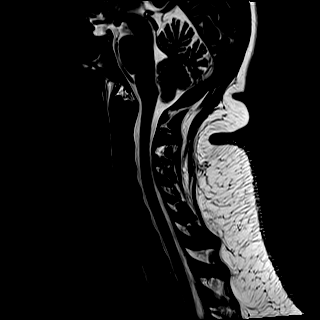
[im 12/15]
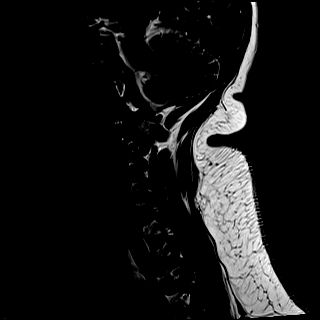
[im 15/15]
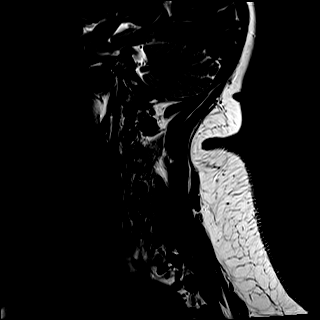

[Series 6: T1 · sagittal · 3.0mm · 0.86mm/px · 6 of 15 slices shown]
[im 1/15]
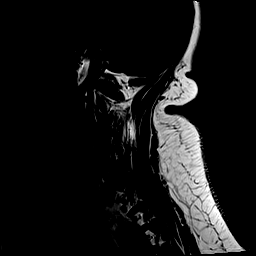
[im 3/15]
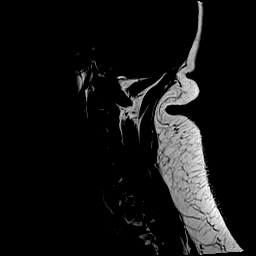
[im 6/15]
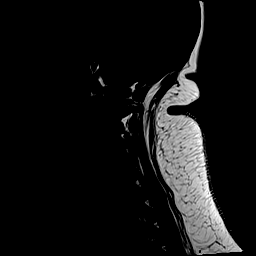
[im 9/15]
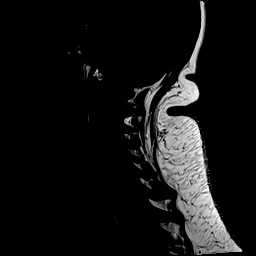
[im 12/15]
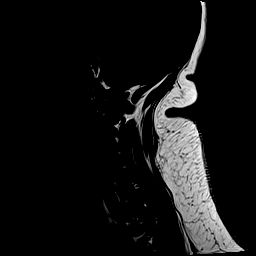
[im 15/15]
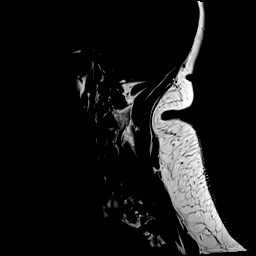

[Series 7: STIR · sagittal · 3.0mm · 0.69mm/px · 6 of 15 slices shown]
[im 1/15]
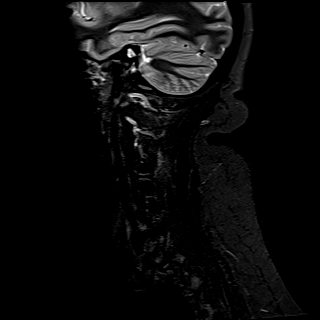
[im 3/15]
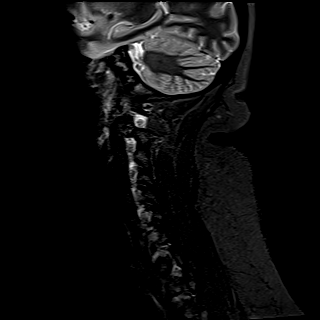
[im 6/15]
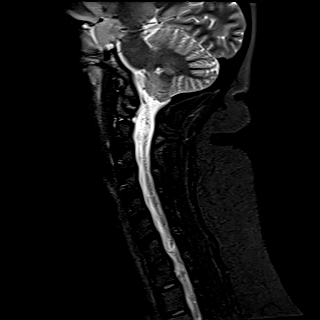
[im 9/15]
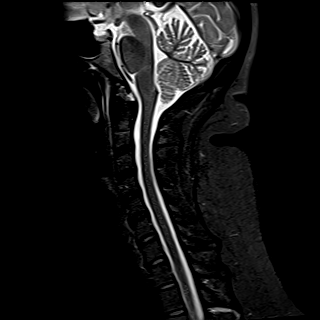
[im 12/15]
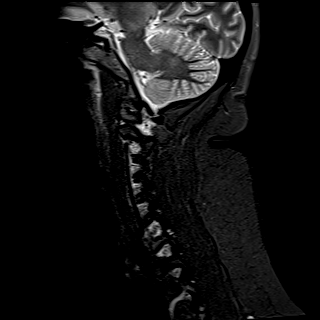
[im 15/15]
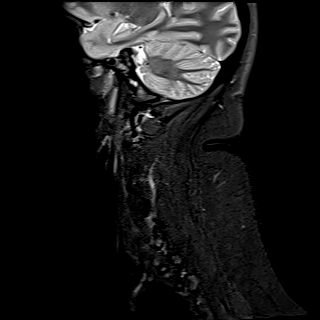

[Series 8: T2 · axial · 3.0mm · 0.70mm/px · z∈[-64,+60]mm · 15 of 38 slices shown (2 of 2)]
[im 1/38]
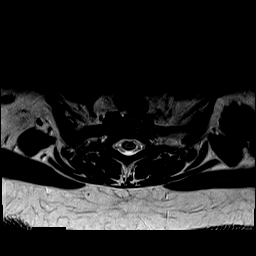
[im 3/38]
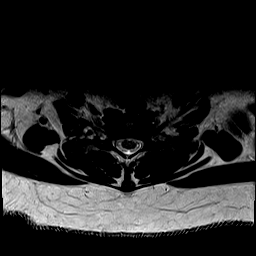
[im 6/38]
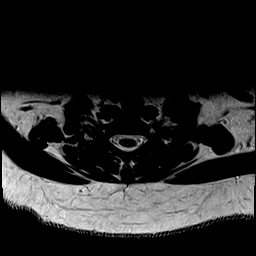
[im 8/38]
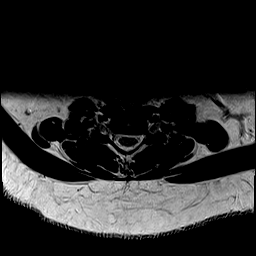
[im 11/38]
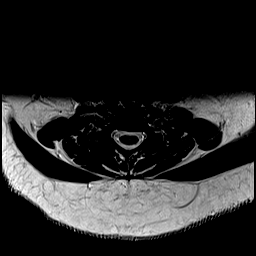
[im 14/38]
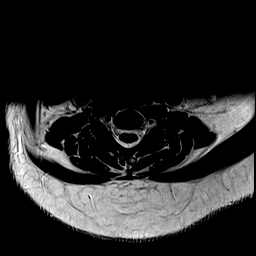
[im 16/38]
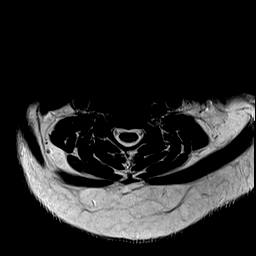
[im 19/38]
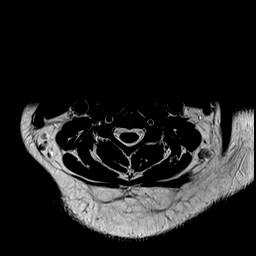
[im 22/38]
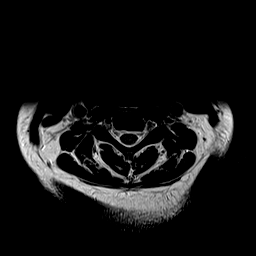
[im 24/38]
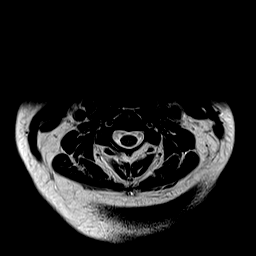
[im 27/38]
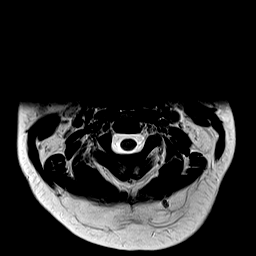
[im 30/38]
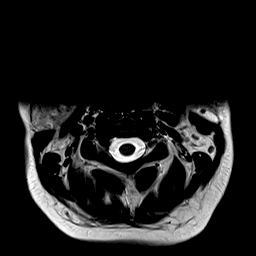
[im 32/38]
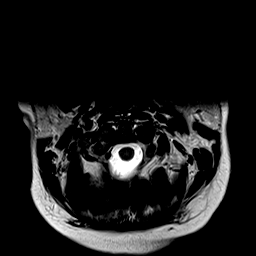
[im 35/38]
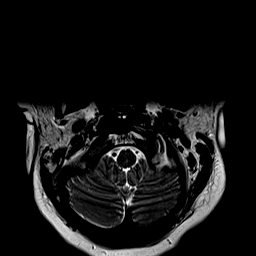
[im 38/38]
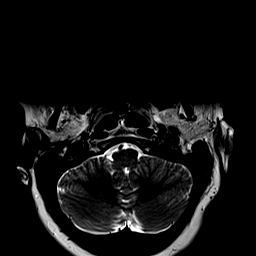

[Series 9: GRE · axial · 3.0mm · 0.35mm/px · z∈[-64,+60]mm · 8 of 38 slices shown]
[im 1/38]
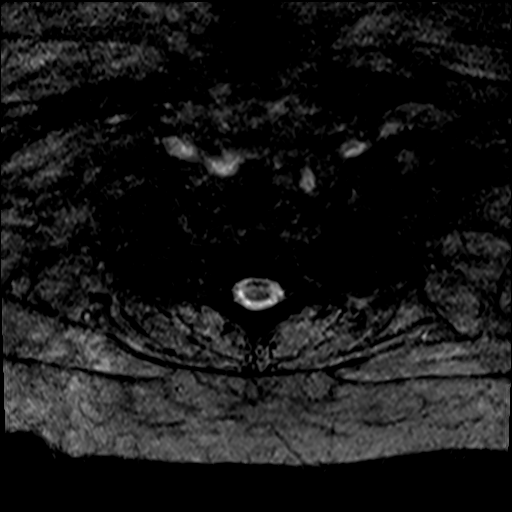
[im 6/38]
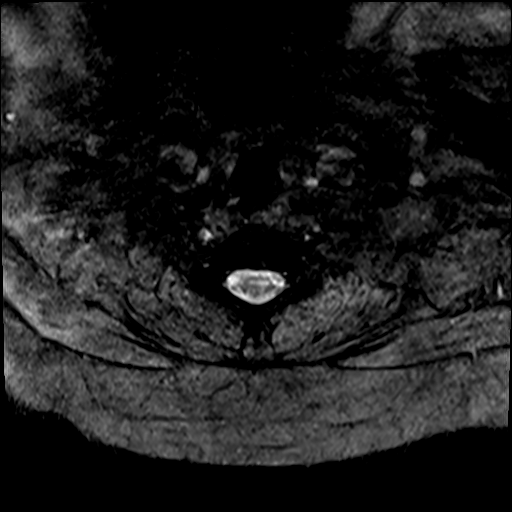
[im 11/38]
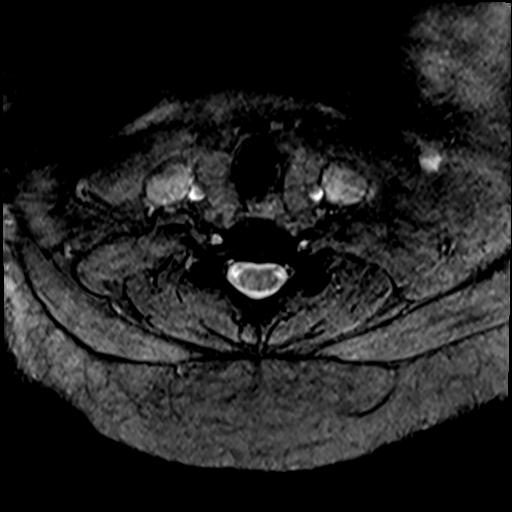
[im 16/38]
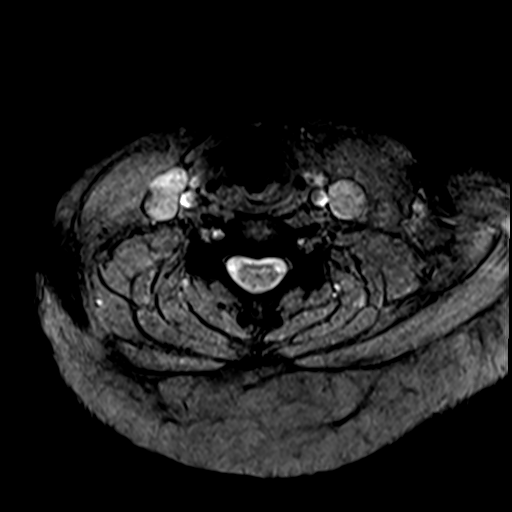
[im 22/38]
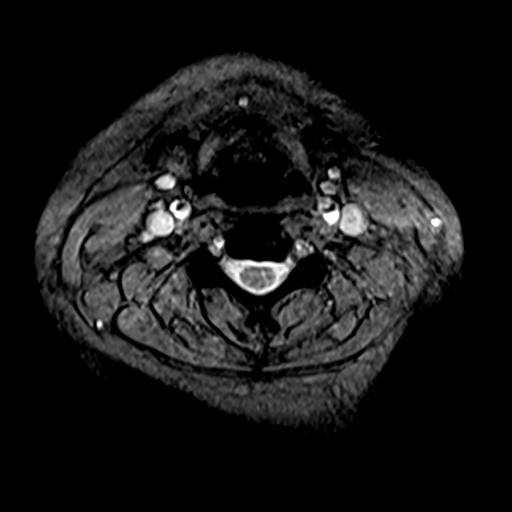
[im 27/38]
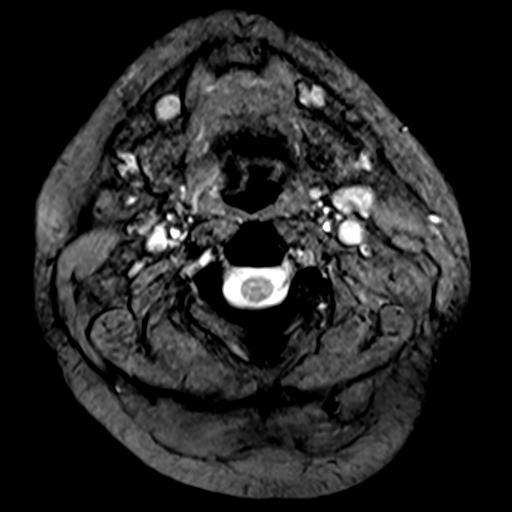
[im 32/38]
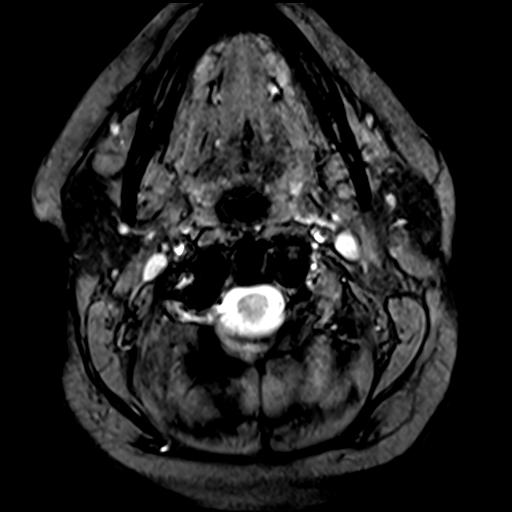
[im 38/38]
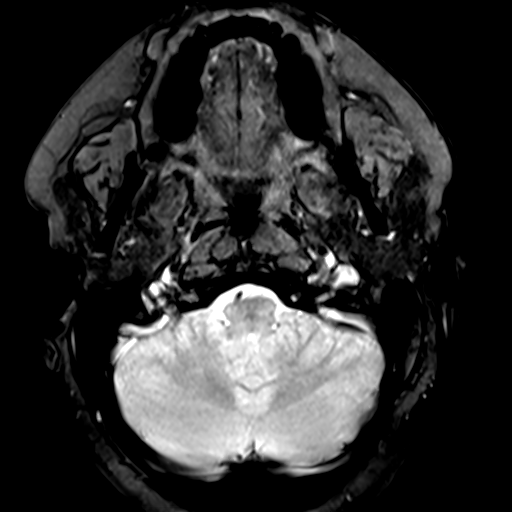

[41 of 48 positions shown; findings below may reference images not displayed]

FINDINGS: Alignment: Normal

Vertebrae: No fracture, evidence of discitis, or bone lesion.

Cord: Normal signal and morphology.

Posterior Fossa, vertebral arteries, paraspinal tissues: Negative.

Disc levels:

Well preserved disc height and hydration. Normal facets. No
herniation or impingement.
IMPRESSION: Normal cervical MRI.

## 2023-05-31 ENCOUNTER — Encounter: Payer: Self-pay | Admitting: *Deleted

## 2023-06-13 ENCOUNTER — Other Ambulatory Visit: Payer: Self-pay | Admitting: Adult Health

## 2023-08-29 ENCOUNTER — Encounter: Payer: Self-pay | Admitting: Internal Medicine

## 2023-08-30 ENCOUNTER — Encounter: Payer: Self-pay | Admitting: Adult Health

## 2023-08-30 ENCOUNTER — Ambulatory Visit: Admitting: Adult Health

## 2023-08-30 VITALS — BP 119/80 | HR 78 | Ht 61.0 in | Wt 226.0 lb

## 2023-08-30 DIAGNOSIS — R1031 Right lower quadrant pain: Secondary | ICD-10-CM | POA: Diagnosis not present

## 2023-08-30 DIAGNOSIS — Z90711 Acquired absence of uterus with remaining cervical stump: Secondary | ICD-10-CM | POA: Diagnosis not present

## 2023-08-30 NOTE — Progress Notes (Signed)
  Subjective:     Patient ID: [name], female   DOB: 12/31/1985, 38 y.o.   MRN: [id number]  HPI [name] is a 38 year old white female, married, sp [facility] in complaining of RLQ pain that comes and goes for a few weeks, can be sharp at times.     Component Value Date/Time   DIAGPAP  06/09/2022 1546    - Negative for intraepithelial lesion or malignancy (NILM)   DIAGPAP  08/22/2018 0000    NEGATIVE FOR INTRAEPITHELIAL LESIONS OR MALIGNANCY.   HPVHIGH Negative 06/09/2022 1546   ADEQPAP  06/09/2022 1546    Satisfactory for evaluation; transformation zone component ABSENT.   ADEQPAP  08/22/2018 0000    Satisfactory for evaluation  endocervical/transformation zone component ABSENT.   PCP is C Keatts NP Review of Systems RLQ pain that comes and goes for a few weeks, can be sharp at times.   Denies any problems with urination or BM, no fever  Reviewed past medical,surgical, social and family history. Reviewed medications and allergies.  Objective:   Physical Exam BP 119/80 (BP Location: Right Arm, Patient Position: Sitting, Cuff Size: Normal)   Pulse 78   Ht 5' 1 (1.549 m)   Wt 226 lb (102.5 kg)   LMP 01/08/2013 Comment: SCH  BMI 42.70 kg/m     Skin warm and dry.Pelvic: external genitalia is normal in appearance no lesions, vagina: pink,urethra has no lesions or masses noted, cervix:smooth, uterus: absent, adnexa: no masses, +RLQ  tenderness noted. Bladder is non tender and no masses felt.  Upstream - 08/30/23 0850       Pregnancy Intention Screening   Does the patient want to become pregnant in the next year? N/A    Does the patient's partner want to become pregnant in the next year? N/A    Would the patient like to discuss contraceptive options today? N/A      Contraception Wrap Up   Current Method Female Sterilization   [facility]   End Method Female Sterilization   [facility]   Contraception Counseling Provided No         Examination chaperoned by Clarita Salt LPN  Assessment:      1. RLQ abdominal pain (Primary) RLQ pain that comes and goes for a few weeks, can be sharp at times. Will get pelvic US  at [facility] 09/05/23 at 11:30 am to assess and will talk when results back  - US  PELVIC COMPLETE WITH TRANSVAGINAL; Future  2. S/P abdominal supracervical subtotal hysterectomy    Plan:     Follow up TBD

## 2023-09-05 ENCOUNTER — Ambulatory Visit (HOSPITAL_COMMUNITY)
Admission: RE | Admit: 2023-09-05 | Discharge: 2023-09-05 | Disposition: A | Discharge status: Home / Self Care | Source: Ambulatory Visit | Attending: Adult Health | Admitting: Adult Health

## 2023-09-05 DIAGNOSIS — R1031 Right lower quadrant pain: Secondary | ICD-10-CM | POA: Insufficient documentation

## 2023-09-11 ENCOUNTER — Ambulatory Visit: Payer: Self-pay | Admitting: Adult Health

## 2023-09-14 ENCOUNTER — Encounter: Payer: Self-pay | Admitting: *Deleted

## 2023-09-14 ENCOUNTER — Other Ambulatory Visit: Payer: Self-pay | Admitting: *Deleted

## 2023-09-14 ENCOUNTER — Encounter: Payer: Self-pay | Admitting: Internal Medicine

## 2023-09-14 ENCOUNTER — Ambulatory Visit (INDEPENDENT_AMBULATORY_CARE_PROVIDER_SITE_OTHER): Admitting: Internal Medicine

## 2023-09-14 VITALS — BP 118/81 | HR 82 | Temp 97.7°F | Ht 61.0 in | Wt 227.2 lb

## 2023-09-14 DIAGNOSIS — R197 Diarrhea, unspecified: Secondary | ICD-10-CM

## 2023-09-14 DIAGNOSIS — K625 Hemorrhage of anus and rectum: Secondary | ICD-10-CM

## 2023-09-14 DIAGNOSIS — K219 Gastro-esophageal reflux disease without esophagitis: Secondary | ICD-10-CM

## 2023-09-14 DIAGNOSIS — Z800 Family history of malignant neoplasm of digestive organs: Secondary | ICD-10-CM

## 2023-09-14 DIAGNOSIS — R1031 Right lower quadrant pain: Secondary | ICD-10-CM

## 2023-09-14 MED ORDER — PEG 3350-KCL-NA BICARB-NACL 420 G PO SOLR: 4000.0000 mL | Freq: Once | ORAL | 0 refills | Status: AC

## 2023-09-14 NOTE — H&P (View-Only) (Signed)
 Primary Care Physician:  Suanne Pfeiffer, NP Primary Gastroenterologist:  Dr. Cindie  Chief Complaint  Patient presents with   Diarrhea    About 1-2 times per day   Abdominal Pain    RLQ, intermittently daily     HPI:   [name] is a 38 y.o. female who presents to clinic today by referral from her PCP Pfeiffer Suanne for evaluation.  Patient reports right lower quadrant abdominal pain x 3 to 4 months.  Constant nagging mild feeling with occasional severe cramping.  Not associated with bowel movements or meals.  Does not radiate.  Pelvic ultrasound 09/05/2023 which I personally reviewed was unremarkable.  She is status post supracervical hysterectomy.  Also with chronically loose stools.  Averages 3-4 bowel movements a day.  At least 1-2 are loose.  Does have some intermittent bright red blood per rectum as well.  Sees bright red blood on the toilet paper.  No blood in the toilet bowl or clots.  Denies any mucus.  No prior colonoscopy.  Sister diagnosed with colon cancer at age 57.  Denies any family history of ulcerative colitis or Crohn's disease.  Also with chronic acid reflux, very mild intermittent.  Takes omeprazole as needed with good control of her symptoms.  Denies any dysphagia or odynophagia.  No epigastric or chest pain.  Past Medical History:  Diagnosis Date   Breast nodule 03/25/2014   BV (bacterial vaginosis) 03/18/2014   Gestational hypertension 12/02/2020   Hypoglycemia    Ovarian cancer (HCC) 12/02/2020   Thyroid disease    Vaginal discharge 03/18/2014   Vaginal itching 03/18/2014    Past Surgical History:  Procedure Laterality Date   ABDOMINAL HYSTERECTOMY     partial- 1 ovary and cervix are intact   APPENDECTOMY     CESAREAN SECTION  2009, 2012   x2   LAPAROSCOPIC APPENDECTOMY N/A 10/11/2016   Procedure: APPENDECTOMY LAPAROSCOPIC;  Surgeon: Kallie Manuelita BROCKS, MD;  Location: [facility];  Service: General;  Laterality: N/A;   LYSIS OF ADHESION  N/A 02/19/2013   Procedure: LYSIS OF ADHESION;  Surgeon: Norleen LULLA Server, MD;  Location: [facility];  Service: Gynecology;  Laterality: N/A;   MASS EXCISION N/A 07/23/2012   Procedure: EXCISION OF RIGHT LOWER ABDOMINAL MASS, ENDOMETRIAL IMPLANT;  Surgeon: Elsie GORMAN Holland, MD;  Location: [facility];  Service: General;  Laterality: N/A;   REFRACTIVE SURGERY Bilateral 2024   SCAR REVISION N/A 02/19/2013   Procedure: EXCISION OF SCAR ENDOMETRIOSIS;  Surgeon: Norleen LULLA Server, MD;  Location: [facility];  Service: Gynecology;  Laterality: N/A;   SUPRACERVICAL ABDOMINAL HYSTERECTOMY N/A 02/19/2013   Procedure: HYSTERECTOMY SUPRACERVICAL ABDOMINAL;  Surgeon: Norleen LULLA Server, MD;  Location: [facility];  Service: Gynecology;  Laterality: N/A;   TONSILLECTOMY     TUBAL LIGATION      Current Outpatient Medications  Medication Sig Dispense Refill   baclofen (LIORESAL) 10 MG tablet Take 10 mg by mouth daily. (Patient taking differently: Take 10 mg by mouth as needed.)     ibuprofen  (ADVIL ,MOTRIN ) 200 MG tablet Take 200-400 mg by mouth every 6 (six) hours as needed for mild pain.     metFORMIN (GLUCOPHAGE-XR) 500 MG 24 hr tablet Take 500 mg by mouth daily.     phentermine 37.5 MG capsule Take by mouth.     sertraline  (ZOLOFT ) 25 MG tablet Take 75 mg by mouth daily.     SUMAtriptan (IMITREX) 100 MG tablet Take by mouth. (Patient taking differently: Take  by mouth every 2 (two) hours as needed.)     SYNTHROID 50 MCG tablet Take 50 mcg by mouth daily.     No current facility-administered medications for this visit.    Allergies as of 09/14/2023 - Review Complete 09/14/2023  Allergen Reaction Noted   Codeine Hives and Itching 12/05/2011    Family History  Problem Relation Age of Onset   Hypertension Father    Diabetes Other    Heart disease Other    Colon cancer Other    Heart disease Other    Colon cancer Paternal Grandmother    Diabetes Maternal Grandmother    Heart disease Maternal Grandmother    Heart  disease Maternal Grandfather     Social History   Socioeconomic History   Marital status: Married    Spouse name: Not on file   Number of children: 2   Years of education: Not on file   Highest education level: Not on file  Occupational History   Occupation: Civil engineer, contracting  Tobacco Use   Smoking status: Never   Smokeless tobacco: Never  Vaping Use   Vaping status: Never Used  Substance and Sexual Activity   Alcohol use: Yes    Comment: occasional   Drug use: No   Sexual activity: Yes    Birth control/protection: Surgical    Comment: hyst [facility]  Other Topics Concern   Not on file  Social History Narrative   2 sons   Social Drivers of Corporate investment banker Strain: Low Risk  (05/31/2023)   Received from [facility]   Overall Financial Resource Strain (CARDIA)    Difficulty of Paying Living Expenses: Not hard at all  Food Insecurity: No Food Insecurity (05/31/2023)   [facility] from [facility]   Hunger Vital Sign    Within the past 12 months, you worried that your food would run out before you got the money to buy more.: Never true    Within the past 12 months, the food you bought just didn't last and you didn't have money to get more.: Never true  Transportation Needs: No Transportation Needs (05/31/2023)   Received from [facility] - Transportation    Lack of Transportation (Medical): No    Lack of Transportation (Non-Medical): No  Physical Activity: Sufficiently Active (06/09/2022)   Exercise Vital Sign    Days of Exercise per Week: 3 days    Minutes of Exercise per Session: 100 min  Stress: No Stress Concern Present (06/09/2022)   [employer] of Occupational Health - Occupational Stress Questionnaire    Feeling of Stress : Not at all  Social Connections: Socially Integrated (06/09/2022)   Social Connection and Isolation Panel    Frequency of Communication with Friends and Family: More than three times a week    Frequency of Social Gatherings with  Friends and Family: More than three times a week    Attends Religious Services: More than 4 times per year    Active Member of [employer] or Organizations: Yes    Attends Engineer, structural: More than 4 times per year    Marital Status: Married  Catering manager Violence: Not At Risk (06/09/2022)   Humiliation, Afraid, Rape, and Kick questionnaire    Fear of Current or Ex-Partner: No    Emotionally Abused: No    Physically Abused: No    Sexually Abused: No    Subjective: Review of Systems  Constitutional:  Negative for chills and fever.  HENT:  Negative for congestion and hearing loss.   Eyes:  Negative for blurred vision and double vision.  Respiratory:  Negative for cough and shortness of breath.   Cardiovascular:  Negative for chest pain and palpitations.  Gastrointestinal:  Positive for abdominal pain and diarrhea. Negative for blood in stool, constipation, heartburn, melena and vomiting.  Genitourinary:  Negative for dysuria and urgency.  Musculoskeletal:  Negative for joint pain and myalgias.  Skin:  Negative for itching and rash.  Neurological:  Negative for dizziness and headaches.  Psychiatric/Behavioral:  Negative for depression. The patient is not nervous/anxious.        Objective: BP 118/81   Pulse 82   Temp 97.7 F (36.5 C)   Ht 5' 1 (1.549 m)   Wt 227 lb 3.2 oz (103.1 kg)   LMP 01/08/2013 Comment: [facility]  BMI 42.93 kg/m  Physical Exam Constitutional:      Appearance: Normal appearance.  HENT:     Head: Normocephalic and atraumatic.  Eyes:     Extraocular Movements: Extraocular movements intact.     Conjunctiva/sclera: Conjunctivae normal.  Cardiovascular:     Rate and Rhythm: Normal rate and regular rhythm.  Pulmonary:     Effort: Pulmonary effort is normal.     Breath sounds: Normal breath sounds.  Abdominal:     General: Bowel sounds are normal.     Palpations: Abdomen is soft.  Musculoskeletal:        General: No swelling. Normal range of  motion.     Cervical back: Normal range of motion and neck supple.  Skin:    General: Skin is warm and dry.     Coloration: Skin is not jaundiced.  Neurological:     General: No focal deficit present.     Mental Status: She is alert and oriented to person, place, and time.  Psychiatric:        Mood and Affect: Mood normal.        Behavior: Behavior normal.      Assessment/Plan:  1.  Right lower quadrant abdominal pain, diarrhea-etiology unclear.  Will schedule for colonoscopy to further evaluate. The risks including infection, bleed, or perforation as well as benefits, limitations, alternatives and imponderables have been reviewed with the patient. Questions have been answered. All parties agreeable.  2.  Bright red blood per rectum-likely benign anorectal source such as hemorrhoids though will further evaluate with colonoscopy as above.  3.  Family history of colon cancer in sister (age 34)-colonoscopy as above.  Recommend she have 1 done at least every 5 years.  4.  Chronic acid reflux-mild, intermittent.  Continue on omeprazole as needed.  No alarm symptoms today to warrant further investigation with upper endoscopy.  Thank you Courtney Keatts for the kind referral.  09/14/2023 9:08 AM   Disclaimer: This note was dictated with voice recognition software. Similar sounding words can inadvertently be transcribed and may not be corrected upon review.

## 2023-09-14 NOTE — Telephone Encounter (Signed)
 Per providers online portal Outpatient procedures do not require pre certification

## 2023-09-14 NOTE — Progress Notes (Signed)
 Primary Care Physician:  Suanne Pfeiffer, NP Primary Gastroenterologist:  Dr. Cindie  Chief Complaint  Patient presents with   Diarrhea    About 1-2 times per day   Abdominal Pain    RLQ, intermittently daily     HPI:   [name] is a 38 y.o. female who presents to clinic today by referral from her PCP Pfeiffer Suanne for evaluation.  Patient reports right lower quadrant abdominal pain x 3 to 4 months.  Constant nagging mild feeling with occasional severe cramping.  Not associated with bowel movements or meals.  Does not radiate.  Pelvic ultrasound 09/05/2023 which I personally reviewed was unremarkable.  She is status post supracervical hysterectomy.  Also with chronically loose stools.  Averages 3-4 bowel movements a day.  At least 1-2 are loose.  Does have some intermittent bright red blood per rectum as well.  Sees bright red blood on the toilet paper.  No blood in the toilet bowl or clots.  Denies any mucus.  No prior colonoscopy.  Sister diagnosed with colon cancer at age 57.  Denies any family history of ulcerative colitis or Crohn's disease.  Also with chronic acid reflux, very mild intermittent.  Takes omeprazole as needed with good control of her symptoms.  Denies any dysphagia or odynophagia.  No epigastric or chest pain.  Past Medical History:  Diagnosis Date   Breast nodule 03/25/2014   BV (bacterial vaginosis) 03/18/2014   Gestational hypertension 12/02/2020   Hypoglycemia    Ovarian cancer (HCC) 12/02/2020   Thyroid disease    Vaginal discharge 03/18/2014   Vaginal itching 03/18/2014    Past Surgical History:  Procedure Laterality Date   ABDOMINAL HYSTERECTOMY     partial- 1 ovary and cervix are intact   APPENDECTOMY     CESAREAN SECTION  2009, 2012   x2   LAPAROSCOPIC APPENDECTOMY N/A 10/11/2016   Procedure: APPENDECTOMY LAPAROSCOPIC;  Surgeon: Kallie Manuelita BROCKS, MD;  Location: [facility];  Service: General;  Laterality: N/A;   LYSIS OF ADHESION  N/A 02/19/2013   Procedure: LYSIS OF ADHESION;  Surgeon: Norleen LULLA Server, MD;  Location: [facility];  Service: Gynecology;  Laterality: N/A;   MASS EXCISION N/A 07/23/2012   Procedure: EXCISION OF RIGHT LOWER ABDOMINAL MASS, ENDOMETRIAL IMPLANT;  Surgeon: Elsie GORMAN Holland, MD;  Location: [facility];  Service: General;  Laterality: N/A;   REFRACTIVE SURGERY Bilateral 2024   SCAR REVISION N/A 02/19/2013   Procedure: EXCISION OF SCAR ENDOMETRIOSIS;  Surgeon: Norleen LULLA Server, MD;  Location: [facility];  Service: Gynecology;  Laterality: N/A;   SUPRACERVICAL ABDOMINAL HYSTERECTOMY N/A 02/19/2013   Procedure: HYSTERECTOMY SUPRACERVICAL ABDOMINAL;  Surgeon: Norleen LULLA Server, MD;  Location: [facility];  Service: Gynecology;  Laterality: N/A;   TONSILLECTOMY     TUBAL LIGATION      Current Outpatient Medications  Medication Sig Dispense Refill   baclofen (LIORESAL) 10 MG tablet Take 10 mg by mouth daily. (Patient taking differently: Take 10 mg by mouth as needed.)     ibuprofen  (ADVIL ,MOTRIN ) 200 MG tablet Take 200-400 mg by mouth every 6 (six) hours as needed for mild pain.     metFORMIN (GLUCOPHAGE-XR) 500 MG 24 hr tablet Take 500 mg by mouth daily.     phentermine 37.5 MG capsule Take by mouth.     sertraline  (ZOLOFT ) 25 MG tablet Take 75 mg by mouth daily.     SUMAtriptan (IMITREX) 100 MG tablet Take by mouth. (Patient taking differently: Take  by mouth every 2 (two) hours as needed.)     SYNTHROID 50 MCG tablet Take 50 mcg by mouth daily.     No current facility-administered medications for this visit.    Allergies as of 09/14/2023 - Review Complete 09/14/2023  Allergen Reaction Noted   Codeine Hives and Itching 12/05/2011    Family History  Problem Relation Age of Onset   Hypertension Father    Diabetes Other    Heart disease Other    Colon cancer Other    Heart disease Other    Colon cancer Paternal Grandmother    Diabetes Maternal Grandmother    Heart disease Maternal Grandmother    Heart  disease Maternal Grandfather     Social History   Socioeconomic History   Marital status: Married    Spouse name: Not on file   Number of children: 2   Years of education: Not on file   Highest education level: Not on file  Occupational History   Occupation: Civil engineer, contracting  Tobacco Use   Smoking status: Never   Smokeless tobacco: Never  Vaping Use   Vaping status: Never Used  Substance and Sexual Activity   Alcohol use: Yes    Comment: occasional   Drug use: No   Sexual activity: Yes    Birth control/protection: Surgical    Comment: hyst [facility]  Other Topics Concern   Not on file  Social History Narrative   2 sons   Social Drivers of Corporate investment banker Strain: Low Risk  (05/31/2023)   Received from [facility]   Overall Financial Resource Strain (CARDIA)    Difficulty of Paying Living Expenses: Not hard at all  Food Insecurity: No Food Insecurity (05/31/2023)   [facility] from [facility]   Hunger Vital Sign    Within the past 12 months, you worried that your food would run out before you got the money to buy more.: Never true    Within the past 12 months, the food you bought just didn't last and you didn't have money to get more.: Never true  Transportation Needs: No Transportation Needs (05/31/2023)   Received [facility] - Transportation    Lack of Transportation (Medical): No    Lack of Transportation (Non-Medical): No  Physical Activity: Sufficiently Active (06/09/2022)   Exercise Vital Sign    Days of Exercise per Week: 3 days    Minutes of Exercise per Session: 100 min  Stress: No Stress Concern Present (06/09/2022)   [employer] of Occupational Health - Occupational Stress Questionnaire    Feeling of Stress : Not at all  Social Connections: Socially Integrated (06/09/2022)   Social Connection and Isolation Panel    Frequency of Communication with Friends and Family: More than three times a week    Frequency of Social Gatherings with  Friends and Family: More than three times a week    Attends Religious Services: More than 4 times per year    Active Member of [employer] or Organizations: Yes    Attends Engineer, structural: More than 4 times per year    Marital Status: Married  Catering manager Violence: Not At Risk (06/09/2022)   Humiliation, Afraid, Rape, and Kick questionnaire    Fear of Current or Ex-Partner: No    Emotionally Abused: No    Physically Abused: No    Sexually Abused: No    Subjective: Review of Systems  Constitutional:  Negative for chills and fever.  HENT:  Negative for congestion and hearing loss.   Eyes:  Negative for blurred vision and double vision.  Respiratory:  Negative for cough and shortness of breath.   Cardiovascular:  Negative for chest pain and palpitations.  Gastrointestinal:  Positive for abdominal pain and diarrhea. Negative for blood in stool, constipation, heartburn, melena and vomiting.  Genitourinary:  Negative for dysuria and urgency.  Musculoskeletal:  Negative for joint pain and myalgias.  Skin:  Negative for itching and rash.  Neurological:  Negative for dizziness and headaches.  Psychiatric/Behavioral:  Negative for depression. The patient is not nervous/anxious.        Objective: BP 118/81   Pulse 82   Temp 97.7 F (36.5 C)   Ht 5' 1 (1.549 m)   Wt 227 lb 3.2 oz (103.1 kg)   LMP 01/08/2013 Comment: [facility]  BMI 42.93 kg/m  Physical Exam Constitutional:      Appearance: Normal appearance.  HENT:     Head: Normocephalic and atraumatic.  Eyes:     Extraocular Movements: Extraocular movements intact.     Conjunctiva/sclera: Conjunctivae normal.  Cardiovascular:     Rate and Rhythm: Normal rate and regular rhythm.  Pulmonary:     Effort: Pulmonary effort is normal.     Breath sounds: Normal breath sounds.  Abdominal:     General: Bowel sounds are normal.     Palpations: Abdomen is soft.  Musculoskeletal:        General: No swelling. Normal range of  motion.     Cervical back: Normal range of motion and neck supple.  Skin:    General: Skin is warm and dry.     Coloration: Skin is not jaundiced.  Neurological:     General: No focal deficit present.     Mental Status: She is alert and oriented to person, place, and time.  Psychiatric:        Mood and Affect: Mood normal.        Behavior: Behavior normal.      Assessment/Plan:  1.  Right lower quadrant abdominal pain, diarrhea-etiology unclear.  Will schedule for colonoscopy to further evaluate. The risks including infection, bleed, or perforation as well as benefits, limitations, alternatives and imponderables have been reviewed with the patient. Questions have been answered. All parties agreeable.  2.  Bright red blood per rectum-likely benign anorectal source such as hemorrhoids though will further evaluate with colonoscopy as above.  3.  Family history of colon cancer in sister (age 34)-colonoscopy as above.  Recommend she have 1 done at least every 5 years.  4.  Chronic acid reflux-mild, intermittent.  Continue on omeprazole as needed.  No alarm symptoms today to warrant further investigation with upper endoscopy.  Thank you Courtney Keatts for the kind referral.  09/14/2023 9:08 AM   Disclaimer: This note was dictated with voice recognition software. Similar sounding words can inadvertently be transcribed and may not be corrected upon review.

## 2023-09-14 NOTE — Patient Instructions (Signed)
 We will schedule you for colonoscopy to further evaluate your abdominal pain, loose stools, as well as your family history of colon cancer.  Continue on omeprazole as needed for your acid reflux.  It was very nice meeting you today.  Dr. Cindie

## 2023-09-28 ENCOUNTER — Encounter (HOSPITAL_COMMUNITY)
Admission: RE | Admit: 2023-09-28 | Discharge: 2023-09-28 | Disposition: A | Discharge status: Home / Self Care | Source: Ambulatory Visit | Attending: Internal Medicine | Admitting: Internal Medicine

## 2023-10-02 ENCOUNTER — Encounter (HOSPITAL_COMMUNITY): Payer: Self-pay | Admitting: Internal Medicine

## 2023-10-02 ENCOUNTER — Ambulatory Visit (HOSPITAL_COMMUNITY)

## 2023-10-02 ENCOUNTER — Ambulatory Visit (HOSPITAL_BASED_OUTPATIENT_CLINIC_OR_DEPARTMENT_OTHER)

## 2023-10-02 ENCOUNTER — Ambulatory Visit (HOSPITAL_COMMUNITY)
Admission: RE | Admit: 2023-10-02 | Discharge: 2023-10-02 | Disposition: A | Discharge status: Home / Self Care | Attending: Internal Medicine | Admitting: Internal Medicine

## 2023-10-02 ENCOUNTER — Encounter (HOSPITAL_COMMUNITY)
Admission: RE | Disposition: A | Discharge status: Home / Self Care | Payer: Self-pay | Source: Home / Self Care | Attending: Internal Medicine

## 2023-10-02 DIAGNOSIS — K648 Other hemorrhoids: Secondary | ICD-10-CM | POA: Insufficient documentation

## 2023-10-02 DIAGNOSIS — Z7984 Long term (current) use of oral hypoglycemic drugs: Secondary | ICD-10-CM | POA: Insufficient documentation

## 2023-10-02 DIAGNOSIS — K529 Noninfective gastroenteritis and colitis, unspecified: Secondary | ICD-10-CM | POA: Diagnosis not present

## 2023-10-02 DIAGNOSIS — Z90710 Acquired absence of both cervix and uterus: Secondary | ICD-10-CM | POA: Diagnosis not present

## 2023-10-02 DIAGNOSIS — R519 Headache, unspecified: Secondary | ICD-10-CM | POA: Diagnosis not present

## 2023-10-02 DIAGNOSIS — R1031 Right lower quadrant pain: Secondary | ICD-10-CM | POA: Insufficient documentation

## 2023-10-02 DIAGNOSIS — Z6841 Body Mass Index (BMI) 40.0 and over, adult: Secondary | ICD-10-CM | POA: Insufficient documentation

## 2023-10-02 DIAGNOSIS — K219 Gastro-esophageal reflux disease without esophagitis: Secondary | ICD-10-CM | POA: Diagnosis not present

## 2023-10-02 DIAGNOSIS — E039 Hypothyroidism, unspecified: Secondary | ICD-10-CM | POA: Diagnosis not present

## 2023-10-02 DIAGNOSIS — F419 Anxiety disorder, unspecified: Secondary | ICD-10-CM | POA: Insufficient documentation

## 2023-10-02 DIAGNOSIS — K625 Hemorrhage of anus and rectum: Secondary | ICD-10-CM | POA: Diagnosis not present

## 2023-10-02 DIAGNOSIS — E119 Type 2 diabetes mellitus without complications: Secondary | ICD-10-CM | POA: Insufficient documentation

## 2023-10-02 DIAGNOSIS — Z800 Family history of malignant neoplasm of digestive organs: Secondary | ICD-10-CM | POA: Insufficient documentation

## 2023-10-02 DIAGNOSIS — K6389 Other specified diseases of intestine: Secondary | ICD-10-CM | POA: Diagnosis not present

## 2023-10-02 DIAGNOSIS — E66813 Obesity, class 3: Secondary | ICD-10-CM | POA: Insufficient documentation

## 2023-10-02 DIAGNOSIS — F32A Depression, unspecified: Secondary | ICD-10-CM | POA: Insufficient documentation

## 2023-10-02 HISTORY — PX: COLONOSCOPY: SHX5424

## 2023-10-02 HISTORY — DX: Prediabetes: R73.03

## 2023-10-02 LAB — GLUCOSE, CAPILLARY: Glucose-Capillary: 122 mg/dL — ABNORMAL HIGH (ref 70–99)

## 2023-10-02 SURGERY — COLONOSCOPY
Anesthesia: Monitor Anesthesia Care

## 2023-10-02 MED ORDER — PROPOFOL 10 MG/ML IV BOLUS
INTRAVENOUS | Status: DC | PRN
  Administered 2023-10-02 (×4): 100 mg via INTRAVENOUS

## 2023-10-02 MED ORDER — PROPOFOL 10 MG/ML IV BOLUS
INTRAVENOUS | Status: AC
  Filled 2023-10-02: qty 20

## 2023-10-02 MED ORDER — PROPOFOL 10 MG/ML IV BOLUS
INTRAVENOUS | Status: AC
Start: 2023-10-02 — End: 2023-10-02
  Filled 2023-10-02: qty 20

## 2023-10-02 MED ORDER — LACTATED RINGERS IV SOLN: INTRAVENOUS | Status: DC

## 2023-10-02 NOTE — Discharge Instructions (Addendum)
  Colonoscopy Discharge Instructions  Read the instructions outlined below and refer to this sheet in the next few weeks. These discharge instructions provide you with general information on caring for yourself after you leave the hospital. Your doctor may also give you specific instructions. While your treatment has been planned according to the most current medical practices available, unavoidable complications occasionally occur.   ACTIVITY You may resume your regular activity, but move at a slower pace for the next 24 hours.  Take frequent rest periods for the next 24 hours.  Walking will help get rid of the air and reduce the bloated feeling in your belly (abdomen).  No driving for 24 hours (because of the medicine (anesthesia) used during the test).   Do not sign any important legal documents or operate any machinery for 24 hours (because of the anesthesia used during the test).  NUTRITION Drink plenty of fluids.  You may resume your normal diet as instructed by your doctor.  Begin with a light meal and progress to your normal diet. Heavy or fried foods are harder to digest and may make you feel sick to your stomach (nauseated).  Avoid alcoholic beverages for 24 hours or as instructed.  MEDICATIONS You may resume your normal medications unless your doctor tells you otherwise.  WHAT YOU CAN EXPECT TODAY Some feelings of bloating in the abdomen.  Passage of more gas than usual.  Spotting of blood in your stool or on the toilet paper.  IF YOU HAD POLYPS REMOVED DURING THE COLONOSCOPY: No aspirin products for 7 days or as instructed.  No alcohol for 7 days or as instructed.  Eat a soft diet for the next 24 hours.  FINDING OUT THE RESULTS OF YOUR TEST Not all test results are available during your visit. If your test results are not back during the visit, make an appointment with your caregiver to find out the results. Do not assume everything is normal if you have not heard from your  caregiver or the medical facility. It is important for you to follow up on all of your test results.  SEEK IMMEDIATE MEDICAL ATTENTION IF: You have more than a spotting of blood in your stool.  Your belly is swollen (abdominal distention).  You are nauseated or vomiting.  You have a temperature over 101.  You have abdominal pain or discomfort that is severe or gets worse throughout the day.   Your colonoscopy was relatively unremarkable.  I did not find any polyps or evidence of colon cancer.  I recommend repeating colonoscopy in 5 years for colon cancer screening purposes given your family history.    Overall, your colon appeared very healthy.  I did not see any active inflammation indicative of underlying inflammatory bowel disease such as Crohn's disease or ulcerative colitis throughout your colon or end portion of your small bowel.  I took biopsies of your colon to further evaluate.  Await pathology results, my office will contact you.  Follow up in GI office in 8 weeks.   I hope you have a great rest of your week!  Carlin POUR. Cindie, D.O. Gastroenterology and Hepatology [facility]

## 2023-10-02 NOTE — Transfer of Care (Signed)
 Immediate Anesthesia Transfer of Care Note  Patient: FATHIMA BARTL  Procedure(s) Performed: COLONOSCOPY  Patient Location: PACU  Anesthesia Type:MAC  Level of Consciousness: awake, drowsy, and patient cooperative  Airway & Oxygen  Therapy: Patient Spontanous Breathing  Post-op Assessment: Report given to RN, Post -op Vital signs reviewed and stable, and Patient moving all extremities X 4  Post vital signs: Reviewed and stable  Last Vitals:  Vitals Value Taken Time  BP    Temp 36.6 C 10/02/23 08:39  Pulse 85 10/02/23 08:39  Resp 22 10/02/23 08:39  SpO2 98 % 10/02/23 08:39    Last Pain:  Vitals:   10/02/23 0839  TempSrc: Oral  PainSc: 0-No pain      Patients Stated Pain Goal: 6 (10/02/23 0724)  Complications: No notable events documented.

## 2023-10-02 NOTE — Interval H&P Note (Signed)
 History and Physical Interval Note:  10/02/2023 8:00 AM  [name]  has presented today for surgery, with the diagnosis of DIARRHEA, FAMILY HISTORY COLON CANCER, BRBPR.  The various methods of treatment have been discussed with the patient and family. After consideration of risks, benefits and other options for treatment, the patient has consented to  Procedure(s) with comments: COLONOSCOPY (N/A) - 800am, asa 2 as a surgical intervention.  The patient's history has been reviewed, patient examined, no change in status, stable for surgery.  I have reviewed the patient's chart and labs.  Questions were answered to the patient's satisfaction.     [name]

## 2023-10-02 NOTE — Anesthesia Preprocedure Evaluation (Addendum)
 Anesthesia Evaluation  Patient identified by MRN, date of birth, ID band Patient awake    Reviewed: Allergy & Precautions, H&P , NPO status , Patient's Chart, lab work & pertinent test results  Airway Mallampati: II  TM Distance: >3 FB Neck ROM: Full    Dental no notable dental hx.    Pulmonary neg pulmonary ROS   Pulmonary exam normal breath sounds clear to auscultation       Cardiovascular hypertension, Normal cardiovascular exam Rhythm:Regular Rate:Normal     Neuro/Psych  Headaches PSYCHIATRIC DISORDERS Anxiety Depression    negative neurological ROS  negative psych ROS   GI/Hepatic negative GI ROS, Neg liver ROS,,,  Endo/Other  diabetesHypothyroidism  Class 3 obesityPre diabetic  Renal/GU negative Renal ROS  negative genitourinary   Musculoskeletal negative musculoskeletal ROS (+)    Abdominal   Peds negative pediatric ROS (+)  Hematology negative hematology ROS (+)   Anesthesia Other Findings   Reproductive/Obstetrics negative OB ROS                              Anesthesia Physical Anesthesia Plan  ASA: 2  Anesthesia Plan: General   Post-op Pain Management:    Induction: Intravenous  PONV Risk Score and Plan:   Airway Management Planned: Nasal Cannula  Additional Equipment:   Intra-op Plan:   Post-operative Plan:   Informed Consent: I have reviewed the patients History and Physical, chart, labs and discussed the procedure including the risks, benefits and alternatives for the proposed anesthesia with the patient or authorized representative who has indicated his/her understanding and acceptance.     Dental advisory given  Plan Discussed with: CRNA  Anesthesia Plan Comments:          Anesthesia Quick Evaluation

## 2023-10-02 NOTE — Anesthesia Postprocedure Evaluation (Signed)
 Anesthesia Post Note  Patient: [name]  Procedure(s) Performed: COLONOSCOPY  Patient location during evaluation: PACU Anesthesia Type: MAC Level of consciousness: awake and alert Pain management: pain level controlled Vital Signs Assessment: post-procedure vital signs reviewed and stable Respiratory status: spontaneous breathing, nonlabored ventilation, respiratory function stable and patient connected to nasal cannula oxygen  Cardiovascular status: blood pressure returned to baseline and stable Postop Assessment: no apparent nausea or vomiting Anesthetic complications: no   No notable events documented.   Last Vitals:  Vitals:   10/02/23 0839 10/02/23 0841  BP:  109/74  Pulse: 85   Resp: (!) 22   Temp: 36.6 C   SpO2: 98%     Last Pain:  Vitals:   10/02/23 0839  TempSrc: Oral  PainSc: 0-No pain                 Andrea Limes

## 2023-10-02 NOTE — Op Note (Signed)
 [facility] Patient Name: [name] Procedure Date: 10/02/2023 8:04 AM MRN: [id number] Date of Birth: [birth year] Attending MD: Carlin POUR. Cindie , [place], [id number] CSN: [id number] Age: 38 Admit Type: Outpatient Procedure:                Colonoscopy Indications:              Chronic diarrhea, Rectal bleeding age 38 Providers:                Carlin POUR. Cindie, DO, Leandrew Edelman RN, RN, Dorcas Lenis, Technician Referring MD:              Medicines:                See the Anesthesia note for documentation of the                            administered medications Complications:            No immediate complications. Estimated Blood Loss:     Estimated blood loss was minimal. Procedure:                Pre-Anesthesia Assessment:                           - The anesthesia plan was to use monitored                            anesthesia care (MAC).                           After obtaining informed consent, the colonoscope                            was passed under direct vision. Throughout the                            procedure, the patient's blood pressure, pulse, and                            oxygen  saturations were monitored continuously. The                            [id number]-[id number] ([id number]) was introduced through the                            anus and advanced to the the terminal ileum, with                            identification of the appendiceal orifice and IC                            valve. The colonoscopy was performed without                            difficulty. The patient  tolerated the procedure                            well. The quality of the bowel preparation was                            evaluated using the BBPS Circles Of Care Bowel Preparation                            Scale) with scores of: Right Colon = 2 (minor                            amount of residual staining, small fragments of                            stool and/or opaque  liquid, but mucosa seen well),                            Transverse Colon = 2 (minor amount of residual                            staining, small fragments of stool and/or opaque                            liquid, but mucosa seen well) and Left Colon = 2                            (minor amount of residual staining, small fragments                            of stool and/or opaque liquid, but mucosa seen                            well). The total BBPS score equals 6. The quality                            of the bowel preparation was good. Scope In: 8:20:36 AM Scope Out: 8:36:41 AM Scope Withdrawal Time: 0 hours 13 minutes 47 seconds  Total Procedure Duration: 0 hours 16 minutes 5 seconds  Findings:      Non-bleeding internal hemorrhoids were found during retroflexion.      A localized area of mucosa in the terminal ileum was nodular. Biopsies       were taken with a cold forceps for histology.      Biopsies for histology were taken with a cold forceps from the ascending       colon, transverse colon and descending colon for evaluation of       microscopic colitis.      The exam was otherwise without abnormality. Impression:               - Non-bleeding internal hemorrhoids.                           - Nodular ileal mucosa. Biopsied.                           -  The examination was otherwise normal.                           - Biopsies were taken with a cold forceps from the                            ascending colon, transverse colon and descending                            colon for evaluation of microscopic colitis. Moderate Sedation:      Per Anesthesia Care Recommendation:           - Patient has a contact number available for                            emergencies. The signs and symptoms of potential                            delayed complications were discussed with the                            patient. Return to normal activities tomorrow.                             Written discharge instructions were provided to the                            patient.                           - Resume previous diet.                           - Continue present medications.                           - Await pathology results.                           - Repeat colonoscopy in 5 years for screening                            purposes family history of colon cancer in sister                            (age 31)                           - Return to GI clinic in 8 weeks. Consider                            hemorrhoid banding Procedure Code(s):        --- Professional ---                           [id number], Colonoscopy, flexible; with biopsy, single  or multiple Diagnosis Code(s):        --- Professional ---                           K63.89, Other specified diseases of intestine                           K64.8, Other hemorrhoids                           K52.9, Noninfective gastroenteritis and colitis,                            unspecified                           K62.5, Hemorrhage of anus and rectum CPT copyright 2022 American Medical Association. All rights reserved. The codes documented in this report are preliminary and upon coder review may  be revised to meet current compliance requirements. Carlin POUR. Cindie, DO Carlin POUR. Cindie, DO 10/02/2023 8:40:35 AM This report has been signed electronically. Number of Addenda: 0

## 2023-10-03 LAB — SURGICAL PATHOLOGY

## 2023-10-04 ENCOUNTER — Encounter (HOSPITAL_COMMUNITY): Payer: Self-pay | Admitting: Internal Medicine

## 2023-10-05 ENCOUNTER — Ambulatory Visit: Payer: Self-pay | Admitting: Internal Medicine

## 2023-12-05 ENCOUNTER — Ambulatory Visit: Admitting: Gastroenterology

## 2023-12-06 ENCOUNTER — Encounter: Payer: Self-pay | Admitting: Gastroenterology
